# Patient Record
Sex: Male | Born: 1935 | Race: White | Hispanic: No | Marital: Married | Smoking: Former smoker
Health system: Southern US, Community
[De-identification: ages and names within clinical notes are randomized; demographics above are authoritative.]

## PROBLEM LIST (undated history)

## (undated) DIAGNOSIS — J449 Chronic obstructive pulmonary disease, unspecified: Secondary | ICD-10-CM

## (undated) DIAGNOSIS — N183 Chronic kidney disease, stage 3 (moderate): Secondary | ICD-10-CM

## (undated) DIAGNOSIS — K219 Gastro-esophageal reflux disease without esophagitis: Secondary | ICD-10-CM

## (undated) DIAGNOSIS — Z87442 Personal history of urinary calculi: Secondary | ICD-10-CM

## (undated) DIAGNOSIS — I4891 Unspecified atrial fibrillation: Secondary | ICD-10-CM

## (undated) DIAGNOSIS — Z8719 Personal history of other diseases of the digestive system: Secondary | ICD-10-CM

## (undated) DIAGNOSIS — M1990 Unspecified osteoarthritis, unspecified site: Secondary | ICD-10-CM

## (undated) DIAGNOSIS — C801 Malignant (primary) neoplasm, unspecified: Secondary | ICD-10-CM

## (undated) DIAGNOSIS — J189 Pneumonia, unspecified organism: Secondary | ICD-10-CM

## (undated) DIAGNOSIS — R600 Localized edema: Secondary | ICD-10-CM

## (undated) DIAGNOSIS — R0602 Shortness of breath: Secondary | ICD-10-CM

## (undated) DIAGNOSIS — G4730 Sleep apnea, unspecified: Secondary | ICD-10-CM

## (undated) DIAGNOSIS — I2699 Other pulmonary embolism without acute cor pulmonale: Secondary | ICD-10-CM

## (undated) DIAGNOSIS — J45909 Unspecified asthma, uncomplicated: Secondary | ICD-10-CM

## (undated) HISTORY — PX: TONSILLECTOMY: SUR1361

## (undated) HISTORY — PX: MULTIPLE TOOTH EXTRACTIONS: SHX2053

## (undated) HISTORY — PX: OTHER SURGICAL HISTORY: SHX169

---

## 2003-03-21 ENCOUNTER — Ambulatory Visit (HOSPITAL_BASED_OUTPATIENT_CLINIC_OR_DEPARTMENT_OTHER): Admission: RE | Admit: 2003-03-21 | Discharge: 2003-03-21 | Payer: Self-pay | Admitting: *Deleted

## 2011-01-29 DIAGNOSIS — E559 Vitamin D deficiency, unspecified: Secondary | ICD-10-CM | POA: Insufficient documentation

## 2012-07-06 DIAGNOSIS — N183 Chronic kidney disease, stage 3 unspecified: Secondary | ICD-10-CM

## 2012-07-06 HISTORY — DX: Chronic kidney disease, stage 3 unspecified: N18.30

## 2012-10-19 ENCOUNTER — Encounter (HOSPITAL_COMMUNITY): Payer: Self-pay | Admitting: Pharmacy Technician

## 2012-10-26 DIAGNOSIS — K219 Gastro-esophageal reflux disease without esophagitis: Secondary | ICD-10-CM | POA: Insufficient documentation

## 2012-10-27 ENCOUNTER — Encounter (HOSPITAL_COMMUNITY): Payer: Self-pay

## 2012-10-27 ENCOUNTER — Ambulatory Visit (HOSPITAL_COMMUNITY)
Admission: RE | Admit: 2012-10-27 | Discharge: 2012-10-27 | Disposition: A | Discharge status: Home / Self Care | Payer: Medicare Other | Source: Ambulatory Visit | Attending: Orthopedic Surgery | Admitting: Orthopedic Surgery

## 2012-10-27 ENCOUNTER — Encounter (HOSPITAL_COMMUNITY)
Admission: RE | Admit: 2012-10-27 | Discharge: 2012-10-27 | Disposition: A | Discharge status: Home / Self Care | Payer: Medicare Other | Source: Ambulatory Visit | Attending: Orthopedic Surgery | Admitting: Orthopedic Surgery

## 2012-10-27 DIAGNOSIS — Z01810 Encounter for preprocedural cardiovascular examination: Secondary | ICD-10-CM | POA: Insufficient documentation

## 2012-10-27 DIAGNOSIS — R0602 Shortness of breath: Secondary | ICD-10-CM | POA: Insufficient documentation

## 2012-10-27 DIAGNOSIS — R0989 Other specified symptoms and signs involving the circulatory and respiratory systems: Secondary | ICD-10-CM | POA: Insufficient documentation

## 2012-10-27 DIAGNOSIS — M129 Arthropathy, unspecified: Secondary | ICD-10-CM | POA: Insufficient documentation

## 2012-10-27 DIAGNOSIS — J449 Chronic obstructive pulmonary disease, unspecified: Secondary | ICD-10-CM | POA: Insufficient documentation

## 2012-10-27 DIAGNOSIS — Z01818 Encounter for other preprocedural examination: Secondary | ICD-10-CM | POA: Insufficient documentation

## 2012-10-27 DIAGNOSIS — J90 Pleural effusion, not elsewhere classified: Secondary | ICD-10-CM | POA: Insufficient documentation

## 2012-10-27 DIAGNOSIS — J4489 Other specified chronic obstructive pulmonary disease: Secondary | ICD-10-CM | POA: Insufficient documentation

## 2012-10-27 DIAGNOSIS — I10 Essential (primary) hypertension: Secondary | ICD-10-CM | POA: Insufficient documentation

## 2012-10-27 DIAGNOSIS — I517 Cardiomegaly: Secondary | ICD-10-CM | POA: Insufficient documentation

## 2012-10-27 DIAGNOSIS — J45909 Unspecified asthma, uncomplicated: Secondary | ICD-10-CM | POA: Insufficient documentation

## 2012-10-27 DIAGNOSIS — Z01812 Encounter for preprocedural laboratory examination: Secondary | ICD-10-CM | POA: Insufficient documentation

## 2012-10-27 DIAGNOSIS — R9431 Abnormal electrocardiogram [ECG] [EKG]: Secondary | ICD-10-CM | POA: Insufficient documentation

## 2012-10-27 HISTORY — DX: Personal history of urinary calculi: Z87.442

## 2012-10-27 HISTORY — DX: Unspecified asthma, uncomplicated: J45.909

## 2012-10-27 HISTORY — DX: Personal history of other diseases of the digestive system: Z87.19

## 2012-10-27 HISTORY — DX: Sleep apnea, unspecified: G47.30

## 2012-10-27 HISTORY — DX: Malignant (primary) neoplasm, unspecified: C80.1

## 2012-10-27 HISTORY — DX: Localized edema: R60.0

## 2012-10-27 HISTORY — DX: Pneumonia, unspecified organism: J18.9

## 2012-10-27 HISTORY — DX: Shortness of breath: R06.02

## 2012-10-27 HISTORY — DX: Other pulmonary embolism without acute cor pulmonale: I26.99

## 2012-10-27 HISTORY — DX: Gastro-esophageal reflux disease without esophagitis: K21.9

## 2012-10-27 HISTORY — DX: Unspecified osteoarthritis, unspecified site: M19.90

## 2012-10-27 HISTORY — DX: Chronic obstructive pulmonary disease, unspecified: J44.9

## 2012-10-27 LAB — BASIC METABOLIC PANEL
BUN: 15 mg/dL (ref 6–23)
Calcium: 9.1 mg/dL (ref 8.4–10.5)
Chloride: 102 mEq/L (ref 96–112)
Creatinine, Ser: 1.32 mg/dL (ref 0.50–1.35)
GFR calc Af Amer: 60 mL/min — ABNORMAL LOW (ref >90)

## 2012-10-27 LAB — CBC
HCT: 44.9 % (ref 39.0–52.0)
MCHC: 32.7 g/dL (ref 30.0–36.0)
MCV: 85.5 fL (ref 78.0–100.0)
Platelets: 145 10*3/uL — ABNORMAL LOW (ref 150–400)
RDW: 14.8 % (ref 11.5–15.5)

## 2012-10-27 LAB — PROTIME-INR: INR: 1.84 — ABNORMAL HIGH (ref 0.00–1.49)

## 2012-10-27 LAB — URINALYSIS, ROUTINE W REFLEX MICROSCOPIC
Bilirubin Urine: NEGATIVE
Glucose, UA: NEGATIVE mg/dL
Ketones, ur: NEGATIVE mg/dL
pH: 5 (ref 5.0–8.0)

## 2012-10-27 NOTE — Patient Instructions (Addendum)
YOUR SURGERY IS SCHEDULED AT [facility]  ON:   Tuesday  5/6  REPORT TO [facility] SHORT STAY CENTER AT:  9:00 AM      PHONE # FOR SHORT STAY IS [phone]  DO NOT EAT OR DRINK ANYTHING AFTER MIDNIGHT THE NIGHT BEFORE YOUR SURGERY.  YOU MAY BRUSH YOUR TEETH, RINSE OUT YOUR MOUTH--BUT NO WATER, NO FOOD, NO CHEWING GUM, NO MINTS, NO CANDIES, NO CHEWING TOBACCO.  PLEASE TAKE THE FOLLOWING MEDICATIONS THE AM OF YOUR SURGERY WITH A FEW SIPS OF WATER:   AMLODIPINE, ENALAPRIL, RANITADINE  USE ALL OF YOUR NEBULIZER MEDICATIONS.    IF YOU HAVE SLEEP APNEA AND USE CPAP OR BIPAP--PLEASE BRING THE MASK AND THE TUBING.  DO NOT BRING YOUR MACHINE.  DO NOT BRING VALUABLES, MONEY, CREDIT CARDS.  DO NOT WEAR JEWELRY, MAKE-UP, NAIL POLISH AND NO METAL PINS OR CLIPS IN YOUR HAIR. CONTACT LENS, DENTURES / PARTIALS, GLASSES SHOULD NOT BE WORN TO SURGERY AND IN MOST CASES-HEARING AIDS WILL NEED TO BE REMOVED.  BRING YOUR GLASSES CASE, ANY EQUIPMENT NEEDED FOR YOUR CONTACT LENS. FOR PATIENTS ADMITTED TO THE HOSPITAL--CHECK OUT TIME THE DAY OF DISCHARGE IS 11:00 AM.  ALL INPATIENT ROOMS ARE PRIVATE - WITH BATHROOM, TELEPHONE, TELEVISION AND WIFI INTERNET.                             PLEASE READ OVER ANY  FACT SHEETS THAT YOU WERE GIVEN: MRSA INFORMATION, BLOOD TRANSFUSION INFORMATION, INCENTIVE SPIROMETER INFORMATION. FAILURE TO FOLLOW THESE INSTRUCTIONS MAY RESULT IN THE CANCELLATION OF YOUR SURGERY.   PATIENT SIGNATURE_________________________________

## 2012-10-27 NOTE — Pre-Procedure Instructions (Signed)
EKG AND CXR WERE DONE TODAY AT [facility]. PT'S WRITTEN SIGNATURE / REQUEST FAXED TO HIS PULMONOLOGIST'S OFFICE - DR. Talmadge Coventry IN [place]--TO OBTAIN PT'S MOST RECENT OFFICE NOTE AND ANY CXR OR PULMONARY FUNCTION STUDIES. PT'S WIFE STATES PT'S SURGERY DATE MOVED FROM 4/29 TO 5/6 BECAUSE PT'S MEDICAL DOCTOR WANTS CLEARANCE FROM PULMONOGIST FOR PT TO HAVE TOTAL KNEE REPLACEMENT AND IMPUT ON TYPE OF ANESTHESIA.  PT AWAITING CLEARANCE.

## 2012-10-31 ENCOUNTER — Encounter (HOSPITAL_COMMUNITY): Payer: Self-pay

## 2012-10-31 NOTE — Pre-Procedure Instructions (Addendum)
PT'S PREOP CXR REPORT, PT, PTT REPORTS FAXED TO DR. Nilsa Nutting OFFICE FOR REVIEW - ORDER IS IN EPIC TO REPEAT PT, INR DAY OF SURGERY - AS PER ANESTHESIOLOGIST'S GUIDELINES. PT'S MOST RECENT OFFICE NOTE 09/06/12, CPAP TITRATION REPORT 11/18/06, SLEEP STUDY REPORT 10/07/06 ON THIS CHART - FAXED BY [facility]-PT'S PULMONOLOGIST.

## 2012-11-01 NOTE — Pre-Procedure Instructions (Signed)
FAXED NOTE RECEIVED FROM DR. Nilsa Nutting OFFICE - NO ACTION NEEDED REGARDING ABNORMAL LABS.

## 2012-11-04 NOTE — Pre-Procedure Instructions (Signed)
Gary Boyer - DR. Nilsa Nutting SURGERY SCHEDULER STATES - PT TO SEE DR. Dorthula Matas ON Monday 11/07/12 - Gary Boyer WILL FAX PT'S CLEARANCE FOR SURGERY - OR IF PT NOT CLEARED - SHE WILL CANCEL THE SURGERY

## 2012-11-06 NOTE — H&P (Signed)
Patient is admitted for left total knee arthroplasty.  Subjective:  Chief Complaint: Left knee OA / pain  HPI: Gary Boyer, 77 y.o. male, has a history of pain and functional disability in the left knee due to arthritis and patient has failed non-surgical conservative treatments for greater than 12 weeks to include NSAID's and/or analgesics and activity modification. Onset of symptoms was gradual starting 5 years ago with gradually worsening course since that time.The patient noted no prior procedures of the left knee. Patient currently rates pain in the left hip at 7 out of 10 with activity. Patient has worsening of pain at night, with activity and weight bearing, trendelenberg gait, pain that interfers with activities of daily living, pain with passive range of motion and crepitus. Patient has evidence of periarticular osteophytes and joint space narrowing by imaging studies. This condition presents safety issues increasing the risk of falls. There is no current active infection. Risks, benefits and expectations were discussed with the patient. Patient understand the risks, benefits and expectations and wishes to proceed with surgery.  D/C Plans: Home with HHPT / SNF  Post-op Meds: No Rx given  Tranexamic Acid: Not to be given - previous PEs  Decadron: To be given  FYI: Coumadin with branching Lovenox after surgery    Past Medical History Diagnosis Date . Dysrhythmia PAST HX OF HEART ABLATION FOR IRREGULAR HEART RATE --SUCCESSFUL-NO FURTHER PROB WITH IRREGULAR HEART BEAT . COPD (chronic obstructive pulmonary disease) HX OF SMOKING FOR 60 YRS-PT SEES DR. Talmadge Coventry IN [place] . Shortness of breath WITH ANY EXERTION-USES OXYGEN AS NEEDED . Pneumonia IN THE PAST - BUT NOT IN THE LAST YR . Asthma . Pulmonary embolus 2011 OR 2012 CHRONIC WARFARIN SINCE THE BLOOD CLOT . Bilateral leg edema WEARS SUPPORT HOSE . History of kidney stones SOME SMALL STONES AT PRESENT  TIME- TAKES POTASSIUM CITRATE . H/O hiatal hernia . GERD (gastroesophageal reflux disease) . Cancer SKIN CANCERS . Arthritis ALL OVER . Sleep apnea USES BIPAP SETTING 16 / 11 - PER PULMONOGIST'S OFFICE NOTE  Past Surgical History Procedure Laterality Date . Mass removed from stomach - benign ABOUT 2008 ? Marland Kitchen Heart ablation . Multiple tooth extractions . Tonsillectomy T & A - AS A CHILD  No prescriptions prior to admission  Allergies Allergen Reactions . Morphine And Related HALLUCINATIONS  History Substance Use Topics . Smoking status: Former Smoker -- 2.00 packs/day for 60 years Types: Cigarettes . Smokeless tobacco: Never Used Comment: QUIT SMOKING 2012 . Alcohol Use: No    Review of Systems Constitutional: Negative.  HENT: Negative.  Eyes: Negative.  Respiratory: Positive for shortness of breath and wheezing.  Cardiovascular: Negative.  Gastrointestinal: Positive for heartburn. Genitourinary: Negative.  Musculoskeletal: Positive for joint pain. Skin: Negative.  Neurological: Negative.  Endo/Heme/Allergies: Positive for environmental allergies. Psychiatric/Behavioral: Negative.    Objective:  Physical Exam Constitutional: He is oriented to person, place, and time. He appears well-developed and well-nourished. HENT: Head: Normocephalic and atraumatic. Eyes: Pupils are equal, round, and reactive to light. Neck: Neck supple. Cardiovascular: Normal rate, regular rhythm and intact distal pulses.  Respiratory: Effort normal and breath sounds normal. No respiratory distress. He has no wheezes. GI: Soft. There is no tenderness. There is no guarding. Musculoskeletal: Left knee: He exhibits decreased range of motion, swelling and bony tenderness. He exhibits no deformity, no laceration and no erythema. Tenderness found. Neurological: He is alert and oriented to person, place, and time. Skin: Skin is warm and dry. Psychiatric: He has a normal  mood  and affect.    Imaging Review Plain radiographs demonstrate severe degenerative joint disease of the left knee(s). The overall alignment isneutral. The bone quality appears to be good for age and reported activity level.  Assessment/Plan:  End stage arthritis, left knee  The patient history, physical examination, clinical judgment of the provider and imaging studies are consistent with end stage degenerative joint disease of the left knee(s) and total knee arthroplasty is deemed medically necessary. The treatment options including medical management, injection therapy arthroscopy and arthroplasty were discussed at length. The risks and benefits of total knee arthroplasty were presented and reviewed. The risks due to aseptic loosening, infection, stiffness, patella tracking problems, thromboembolic complications and other imponderables were discussed. The patient acknowledged the explanation, agreed to proceed with the plan and consent was signed. Patient is being admitted for inpatient treatment for surgery, pain control, PT, OT, prophylactic antibiotics, VTE prophylaxis, progressive ambulation and ADL's and discharge planning. The patient is planning to be discharged home with home health services vs SNF.   Anastasio Auerbach Danyel Griess   PAC  11/06/2012, 10:48 PM

## 2012-11-08 ENCOUNTER — Inpatient Hospital Stay (HOSPITAL_COMMUNITY): Payer: Medicare Other | Admitting: Anesthesiology

## 2012-11-08 ENCOUNTER — Encounter (HOSPITAL_COMMUNITY): Payer: Self-pay | Admitting: *Deleted

## 2012-11-08 ENCOUNTER — Inpatient Hospital Stay (HOSPITAL_COMMUNITY)
Admission: RE | Admit: 2012-11-08 | Discharge: 2012-11-11 | DRG: 470 | Disposition: A | Discharge status: Home Health | Payer: Medicare Other | Source: Ambulatory Visit | Attending: Orthopedic Surgery | Admitting: Orthopedic Surgery

## 2012-11-08 ENCOUNTER — Encounter (HOSPITAL_COMMUNITY): Payer: Self-pay | Admitting: Anesthesiology

## 2012-11-08 ENCOUNTER — Encounter (HOSPITAL_COMMUNITY)
Admission: RE | Disposition: A | Discharge status: Home Health | Payer: Self-pay | Source: Ambulatory Visit | Attending: Orthopedic Surgery

## 2012-11-08 DIAGNOSIS — I2782 Chronic pulmonary embolism: Secondary | ICD-10-CM | POA: Diagnosis present

## 2012-11-08 DIAGNOSIS — Z7901 Long term (current) use of anticoagulants: Secondary | ICD-10-CM

## 2012-11-08 DIAGNOSIS — K219 Gastro-esophageal reflux disease without esophagitis: Secondary | ICD-10-CM | POA: Diagnosis present

## 2012-11-08 DIAGNOSIS — D500 Iron deficiency anemia secondary to blood loss (chronic): Secondary | ICD-10-CM

## 2012-11-08 DIAGNOSIS — J4489 Other specified chronic obstructive pulmonary disease: Secondary | ICD-10-CM | POA: Diagnosis present

## 2012-11-08 DIAGNOSIS — G4730 Sleep apnea, unspecified: Secondary | ICD-10-CM | POA: Diagnosis present

## 2012-11-08 DIAGNOSIS — Z87442 Personal history of urinary calculi: Secondary | ICD-10-CM

## 2012-11-08 DIAGNOSIS — Z87891 Personal history of nicotine dependence: Secondary | ICD-10-CM

## 2012-11-08 DIAGNOSIS — M1710 Unilateral primary osteoarthritis, unspecified knee: Principal | ICD-10-CM | POA: Diagnosis present

## 2012-11-08 DIAGNOSIS — D62 Acute posthemorrhagic anemia: Secondary | ICD-10-CM | POA: Diagnosis not present

## 2012-11-08 DIAGNOSIS — J449 Chronic obstructive pulmonary disease, unspecified: Secondary | ICD-10-CM | POA: Diagnosis present

## 2012-11-08 DIAGNOSIS — Z96652 Presence of left artificial knee joint: Secondary | ICD-10-CM

## 2012-11-08 DIAGNOSIS — Z96659 Presence of unspecified artificial knee joint: Secondary | ICD-10-CM

## 2012-11-08 DIAGNOSIS — Z481 Encounter for planned postprocedural wound closure: Secondary | ICD-10-CM

## 2012-11-08 HISTORY — PX: TOTAL KNEE ARTHROPLASTY: SHX125

## 2012-11-08 LAB — CREATININE, SERUM
Creatinine, Ser: 1.58 mg/dL — ABNORMAL HIGH (ref 0.50–1.35)
GFR calc Af Amer: 48 mL/min — ABNORMAL LOW (ref >90)
GFR calc non Af Amer: 41 mL/min — ABNORMAL LOW (ref >90)

## 2012-11-08 LAB — CBC
MCHC: 33.2 g/dL (ref 30.0–36.0)
Platelets: 121 10*3/uL — ABNORMAL LOW (ref 150–400)
RDW: 14.9 % (ref 11.5–15.5)
WBC: 6.2 10*3/uL (ref 4.0–10.5)

## 2012-11-08 LAB — ABO/RH: ABO/RH(D): O NEG

## 2012-11-08 LAB — TYPE AND SCREEN

## 2012-11-08 LAB — PROTIME-INR: Prothrombin Time: 16.6 seconds — ABNORMAL HIGH (ref 11.6–15.2)

## 2012-11-08 SURGERY — ARTHROPLASTY, KNEE, TOTAL
Anesthesia: Spinal | Site: Knee | Laterality: Left | Wound class: Clean

## 2012-11-08 MED ORDER — HYDROCODONE-ACETAMINOPHEN 7.5-325 MG PO TABS
1.0000 | ORAL_TABLET | ORAL | Status: DC
  Administered 2012-11-08 – 2012-11-09 (×2): 2 via ORAL
  Administered 2012-11-09: 1 via ORAL
  Administered 2012-11-09 – 2012-11-11 (×8): 2 via ORAL
  Administered 2012-11-11: 1 via ORAL
  Administered 2012-11-11: 2 via ORAL
  Filled 2012-11-08 (×9): qty 2
  Filled 2012-11-08: qty 1
  Filled 2012-11-08 (×4): qty 2

## 2012-11-08 MED ORDER — ACETAMINOPHEN 10 MG/ML IV SOLN: 1000.0000 mg | Freq: Once | INTRAVENOUS | Status: DC | PRN

## 2012-11-08 MED ORDER — LIDOCAINE HCL (CARDIAC) 20 MG/ML IV SOLN
INTRAVENOUS | Status: DC | PRN
  Administered 2012-11-08: 50 mg via INTRAVENOUS

## 2012-11-08 MED ORDER — PROMETHAZINE HCL 25 MG/ML IJ SOLN: 6.2500 mg | INTRAMUSCULAR | Status: DC | PRN

## 2012-11-08 MED ORDER — HYDROMORPHONE HCL PF 1 MG/ML IJ SOLN
0.2500 mg | INTRAMUSCULAR | Status: DC | PRN
  Administered 2012-11-08 (×2): 0.5 mg via INTRAVENOUS

## 2012-11-08 MED ORDER — SODIUM CHLORIDE 0.9 % IV SOLN
INTRAVENOUS | Status: DC
Start: 2012-11-08 — End: 2012-11-11
  Administered 2012-11-08: 15:00:00 via INTRAVENOUS
  Filled 2012-11-08 (×9): qty 1000

## 2012-11-08 MED ORDER — WARFARIN - PHARMACIST DOSING INPATIENT: Freq: Every day | Status: DC

## 2012-11-08 MED ORDER — ONDANSETRON HCL 4 MG/2ML IJ SOLN
INTRAMUSCULAR | Status: DC | PRN
  Administered 2012-11-08: 4 mg via INTRAVENOUS

## 2012-11-08 MED ORDER — FUROSEMIDE 40 MG PO TABS
40.0000 mg | ORAL_TABLET | Freq: Every day | ORAL | Status: DC
  Administered 2012-11-09 – 2012-11-11 (×3): 40 mg via ORAL
  Filled 2012-11-08 (×5): qty 1

## 2012-11-08 MED ORDER — CEFAZOLIN SODIUM-DEXTROSE 2-3 GM-% IV SOLR
2.0000 g | Freq: Four times a day (QID) | INTRAVENOUS | Status: AC
  Administered 2012-11-08 (×2): 2 g via INTRAVENOUS
  Filled 2012-11-08 (×2): qty 50

## 2012-11-08 MED ORDER — KETOROLAC TROMETHAMINE 30 MG/ML IJ SOLN
INTRAMUSCULAR | Status: DC | PRN
  Administered 2012-11-08: 30 mg

## 2012-11-08 MED ORDER — ARFORMOTEROL TARTRATE 15 MCG/2ML IN NEBU
15.0000 ug | INHALATION_SOLUTION | Freq: Two times a day (BID) | RESPIRATORY_TRACT | Status: DC
  Administered 2012-11-08 – 2012-11-11 (×5): 15 ug via RESPIRATORY_TRACT
  Filled 2012-11-08 (×8): qty 2

## 2012-11-08 MED ORDER — FENTANYL CITRATE 0.05 MG/ML IJ SOLN
INTRAMUSCULAR | Status: DC | PRN
  Administered 2012-11-08 (×2): 50 ug via INTRAVENOUS

## 2012-11-08 MED ORDER — ONDANSETRON HCL 4 MG/2ML IJ SOLN: 4.0000 mg | Freq: Four times a day (QID) | INTRAMUSCULAR | Status: DC | PRN

## 2012-11-08 MED ORDER — DIPHENHYDRAMINE HCL 12.5 MG/5ML PO ELIX: 25.0000 mg | ORAL_SOLUTION | Freq: Four times a day (QID) | ORAL | Status: DC | PRN

## 2012-11-08 MED ORDER — MIDAZOLAM HCL 5 MG/5ML IJ SOLN
INTRAMUSCULAR | Status: DC | PRN
  Administered 2012-11-08 (×2): 1 mg via INTRAVENOUS

## 2012-11-08 MED ORDER — POTASSIUM CITRATE ER 10 MEQ (1080 MG) PO TBCR
10.0000 meq | EXTENDED_RELEASE_TABLET | Freq: Two times a day (BID) | ORAL | Status: DC
  Administered 2012-11-08 – 2012-11-11 (×6): 10 meq via ORAL
  Filled 2012-11-08 (×7): qty 1

## 2012-11-08 MED ORDER — ACETAMINOPHEN 10 MG/ML IV SOLN
INTRAVENOUS | Status: DC | PRN
  Administered 2012-11-08: 1000 mg via INTRAVENOUS

## 2012-11-08 MED ORDER — BUPIVACAINE LIPOSOME 1.3 % IJ SUSP
20.0000 mL | Freq: Once | INTRAMUSCULAR | Status: DC
  Filled 2012-11-08: qty 20

## 2012-11-08 MED ORDER — SODIUM CHLORIDE 0.9 % IJ SOLN
INTRAMUSCULAR | Status: DC | PRN
  Administered 2012-11-08: 19 mL

## 2012-11-08 MED ORDER — DEXTROSE 5 % IV SOLN
3.0000 g | INTRAVENOUS | Status: AC
  Administered 2012-11-08: 3 g via INTRAVENOUS

## 2012-11-08 MED ORDER — AMLODIPINE BESYLATE 10 MG PO TABS
10.0000 mg | ORAL_TABLET | Freq: Every morning | ORAL | Status: DC
  Administered 2012-11-09 – 2012-11-11 (×3): 10 mg via ORAL
  Filled 2012-11-08 (×3): qty 1

## 2012-11-08 MED ORDER — POTASSIUM CHLORIDE CRYS ER 20 MEQ PO TBCR
20.0000 meq | EXTENDED_RELEASE_TABLET | Freq: Two times a day (BID) | ORAL | Status: DC
  Administered 2012-11-08 – 2012-11-11 (×6): 20 meq via ORAL
  Filled 2012-11-08 (×8): qty 1

## 2012-11-08 MED ORDER — PHENOL 1.4 % MT LIQD: 1.0000 | OROMUCOSAL | Status: DC | PRN

## 2012-11-08 MED ORDER — BUPIVACAINE LIPOSOME 1.3 % IJ SUSP
INTRAMUSCULAR | Status: DC | PRN
  Administered 2012-11-08: 20 mL

## 2012-11-08 MED ORDER — FAMOTIDINE 20 MG PO TABS
20.0000 mg | ORAL_TABLET | Freq: Every day | ORAL | Status: DC
  Administered 2012-11-09 – 2012-11-11 (×3): 20 mg via ORAL
  Filled 2012-11-08 (×3): qty 1

## 2012-11-08 MED ORDER — BUDESONIDE 0.5 MG/2ML IN SUSP
0.5000 mg | Freq: Two times a day (BID) | RESPIRATORY_TRACT | Status: DC
  Administered 2012-11-08 – 2012-11-11 (×5): 0.5 mg via RESPIRATORY_TRACT
  Filled 2012-11-08 (×8): qty 2

## 2012-11-08 MED ORDER — ONDANSETRON HCL 4 MG PO TABS: 4.0000 mg | ORAL_TABLET | Freq: Four times a day (QID) | ORAL | Status: DC | PRN

## 2012-11-08 MED ORDER — IPRATROPIUM BROMIDE 0.02 % IN SOLN
500.0000 ug | Freq: Four times a day (QID) | RESPIRATORY_TRACT | Status: DC
  Administered 2012-11-08 – 2012-11-11 (×10): 500 ug via RESPIRATORY_TRACT
  Filled 2012-11-08 (×12): qty 2.5

## 2012-11-08 MED ORDER — PROPOFOL 10 MG/ML IV EMUL
INTRAVENOUS | Status: DC | PRN
  Administered 2012-11-08: 50 ug/kg/min via INTRAVENOUS

## 2012-11-08 MED ORDER — 0.9 % SODIUM CHLORIDE (POUR BTL) OPTIME
TOPICAL | Status: DC | PRN
  Administered 2012-11-08: 1000 mL

## 2012-11-08 MED ORDER — MENTHOL 3 MG MT LOZG: 1.0000 | LOZENGE | OROMUCOSAL | Status: DC | PRN

## 2012-11-08 MED ORDER — ENOXAPARIN SODIUM 40 MG/0.4ML ~~LOC~~ SOLN
40.0000 mg | SUBCUTANEOUS | Status: DC
  Administered 2012-11-09 – 2012-11-11 (×3): 40 mg via SUBCUTANEOUS
  Filled 2012-11-08 (×4): qty 0.4

## 2012-11-08 MED ORDER — PHENYLEPHRINE HCL 10 MG/ML IJ SOLN
20.0000 mg | INTRAVENOUS | Status: DC | PRN
  Administered 2012-11-08: 20 ug/min via INTRAVENOUS

## 2012-11-08 MED ORDER — VITAMIN B-12 1000 MCG PO TABS
1000.0000 ug | ORAL_TABLET | Freq: Every day | ORAL | Status: DC
  Administered 2012-11-08 – 2012-11-11 (×4): 1000 ug via ORAL
  Filled 2012-11-08 (×4): qty 1

## 2012-11-08 MED ORDER — BUPIVACAINE-EPINEPHRINE PF 0.25-1:200000 % IJ SOLN
INTRAMUSCULAR | Status: DC | PRN
  Administered 2012-11-08: 20 mL

## 2012-11-08 MED ORDER — MEPERIDINE HCL 50 MG/ML IJ SOLN: 6.2500 mg | INTRAMUSCULAR | Status: DC | PRN

## 2012-11-08 MED ORDER — DEXAMETHASONE SODIUM PHOSPHATE 10 MG/ML IJ SOLN
10.0000 mg | Freq: Once | INTRAMUSCULAR | Status: AC
  Administered 2012-11-08: 10 mg via INTRAVENOUS

## 2012-11-08 MED ORDER — METHOCARBAMOL 500 MG PO TABS
500.0000 mg | ORAL_TABLET | Freq: Four times a day (QID) | ORAL | Status: DC | PRN
  Administered 2012-11-09 – 2012-11-11 (×5): 500 mg via ORAL
  Filled 2012-11-08 (×5): qty 1

## 2012-11-08 MED ORDER — ALUM & MAG HYDROXIDE-SIMETH 200-200-20 MG/5ML PO SUSP
30.0000 mL | ORAL | Status: DC | PRN
  Administered 2012-11-09: 30 mL via ORAL
  Filled 2012-11-08: qty 30

## 2012-11-08 MED ORDER — LACTATED RINGERS IV SOLN
INTRAVENOUS | Status: DC | PRN
  Administered 2012-11-08: 11:00:00 via INTRAVENOUS

## 2012-11-08 MED ORDER — ZOLPIDEM TARTRATE 5 MG PO TABS: 5.0000 mg | ORAL_TABLET | Freq: Every evening | ORAL | Status: DC | PRN

## 2012-11-08 MED ORDER — EPHEDRINE SULFATE 50 MG/ML IJ SOLN
INTRAMUSCULAR | Status: DC | PRN
  Administered 2012-11-08: 10 mg via INTRAVENOUS
  Administered 2012-11-08: 5 mg via INTRAVENOUS
  Administered 2012-11-08: 10 mg via INTRAVENOUS

## 2012-11-08 MED ORDER — CHLORHEXIDINE GLUCONATE 4 % EX LIQD: 60.0000 mL | Freq: Once | CUTANEOUS | Status: DC

## 2012-11-08 MED ORDER — SERTRALINE HCL 50 MG PO TABS
50.0000 mg | ORAL_TABLET | Freq: Every day | ORAL | Status: DC
Start: 2012-11-08 — End: 2012-11-11
  Administered 2012-11-08 – 2012-11-11 (×3): 50 mg via ORAL
  Filled 2012-11-08 (×4): qty 1

## 2012-11-08 MED ORDER — HYDROMORPHONE HCL PF 1 MG/ML IJ SOLN
0.2000 mg | INTRAMUSCULAR | Status: DC | PRN
  Administered 2012-11-09: 0.5 mg via INTRAVENOUS
  Filled 2012-11-08: qty 1

## 2012-11-08 MED ORDER — WARFARIN SODIUM 7.5 MG PO TABS
7.5000 mg | ORAL_TABLET | Freq: Once | ORAL | Status: AC
  Administered 2012-11-08: 7.5 mg via ORAL
  Filled 2012-11-08: qty 1

## 2012-11-08 MED ORDER — FERROUS SULFATE 325 (65 FE) MG PO TABS
325.0000 mg | ORAL_TABLET | Freq: Three times a day (TID) | ORAL | Status: DC
  Administered 2012-11-09 – 2012-11-11 (×6): 325 mg via ORAL
  Filled 2012-11-08 (×11): qty 1

## 2012-11-08 MED ORDER — ALBUTEROL SULFATE (5 MG/ML) 0.5% IN NEBU: 2.5000 mg | INHALATION_SOLUTION | Freq: Four times a day (QID) | RESPIRATORY_TRACT | Status: DC | PRN

## 2012-11-08 MED ORDER — METHOCARBAMOL 100 MG/ML IJ SOLN: 500.0000 mg | Freq: Four times a day (QID) | INTRAVENOUS | Status: DC | PRN

## 2012-11-08 SURGICAL SUPPLY — 54 items
BAG ZIPLOCK 12X15 (MISCELLANEOUS) ×2 IMPLANT
BANDAGE ELASTIC 6 VELCRO ST LF (GAUZE/BANDAGES/DRESSINGS) ×2 IMPLANT
BANDAGE ESMARK 6X9 LF (GAUZE/BANDAGES/DRESSINGS) ×1 IMPLANT
BLADE SAW SGTL 13.0X1.19X90.0M (BLADE) ×4 IMPLANT
BNDG ESMARK 6X9 LF (GAUZE/BANDAGES/DRESSINGS) ×2
BOWL SMART MIX CTS (DISPOSABLE) ×2 IMPLANT
CEMENT HV SMART SET (Cement) ×4 IMPLANT
CLOTH BEACON ORANGE TIMEOUT ST (SAFETY) ×2 IMPLANT
CUFF TOURN SGL QUICK 34 (TOURNIQUET CUFF) ×1
CUFF TRNQT CYL 34X4X40X1 (TOURNIQUET CUFF) ×1 IMPLANT
DECANTER SPIKE VIAL GLASS SM (MISCELLANEOUS) ×2 IMPLANT
DERMABOND ADVANCED (GAUZE/BANDAGES/DRESSINGS) ×1
DERMABOND ADVANCED .7 DNX12 (GAUZE/BANDAGES/DRESSINGS) ×1 IMPLANT
DRAPE EXTREMITY T 121X128X90 (DRAPE) ×2 IMPLANT
DRAPE POUCH INSTRU U-SHP 10X18 (DRAPES) ×2 IMPLANT
DRAPE U-SHAPE 47X51 STRL (DRAPES) ×2 IMPLANT
DRSG AQUACEL AG ADV 3.5X10 (GAUZE/BANDAGES/DRESSINGS) ×2 IMPLANT
DRSG TEGADERM 4X4.75 (GAUZE/BANDAGES/DRESSINGS) ×2 IMPLANT
DURAPREP 26ML APPLICATOR (WOUND CARE) ×2 IMPLANT
ELECT REM PT RETURN 9FT ADLT (ELECTROSURGICAL) ×2
ELECTRODE REM PT RTRN 9FT ADLT (ELECTROSURGICAL) ×1 IMPLANT
EVACUATOR 1/8 PVC DRAIN (DRAIN) ×2 IMPLANT
FACESHIELD LNG OPTICON STERILE (SAFETY) ×10 IMPLANT
GAUZE SPONGE 2X2 8PLY STRL LF (GAUZE/BANDAGES/DRESSINGS) ×1 IMPLANT
GLOVE BIOGEL PI IND STRL 7.5 (GLOVE) ×1 IMPLANT
GLOVE BIOGEL PI IND STRL 8 (GLOVE) ×1 IMPLANT
GLOVE BIOGEL PI INDICATOR 7.5 (GLOVE) ×1
GLOVE BIOGEL PI INDICATOR 8 (GLOVE) ×1
GLOVE ECLIPSE 8.0 STRL XLNG CF (GLOVE) ×2 IMPLANT
GLOVE ORTHO TXT STRL SZ7.5 (GLOVE) ×4 IMPLANT
GOWN BRE IMP PREV XXLGXLNG (GOWN DISPOSABLE) ×2 IMPLANT
GOWN STRL NON-REIN LRG LVL3 (GOWN DISPOSABLE) ×2 IMPLANT
HANDPIECE INTERPULSE COAX TIP (DISPOSABLE) ×1
KIT BASIN OR (CUSTOM PROCEDURE TRAY) ×2 IMPLANT
MANIFOLD NEPTUNE II (INSTRUMENTS) ×2 IMPLANT
NDL SAFETY ECLIPSE 18X1.5 (NEEDLE) ×1 IMPLANT
NEEDLE HYPO 18GX1.5 SHARP (NEEDLE) ×1
NS IRRIG 1000ML POUR BTL (IV SOLUTION) ×4 IMPLANT
PACK TOTAL JOINT (CUSTOM PROCEDURE TRAY) ×2 IMPLANT
POSITIONER SURGICAL ARM (MISCELLANEOUS) ×2 IMPLANT
SET HNDPC FAN SPRY TIP SCT (DISPOSABLE) ×1 IMPLANT
SET PAD KNEE POSITIONER (MISCELLANEOUS) ×2 IMPLANT
SPONGE GAUZE 2X2 STER 10/PKG (GAUZE/BANDAGES/DRESSINGS) ×1
SUCTION FRAZIER 12FR DISP (SUCTIONS) ×2 IMPLANT
SUT MNCRL AB 4-0 PS2 18 (SUTURE) ×2 IMPLANT
SUT VIC AB 1 CT1 36 (SUTURE) ×2 IMPLANT
SUT VIC AB 2-0 CT1 27 (SUTURE) ×3
SUT VIC AB 2-0 CT1 TAPERPNT 27 (SUTURE) ×3 IMPLANT
SUT VLOC 180 0 24IN GS25 (SUTURE) ×2 IMPLANT
SYR 50ML LL SCALE MARK (SYRINGE) ×2 IMPLANT
TOWEL OR 17X26 10 PK STRL BLUE (TOWEL DISPOSABLE) ×4 IMPLANT
TRAY FOLEY CATH 14FRSI W/METER (CATHETERS) ×2 IMPLANT
WATER STERILE IRR 1500ML POUR (IV SOLUTION) ×2 IMPLANT
WRAP KNEE MAXI GEL POST OP (GAUZE/BANDAGES/DRESSINGS) ×2 IMPLANT

## 2012-11-08 NOTE — Interval H&P Note (Signed)
History and Physical Interval Note:  11/08/2012 10:08 AM  Nestor Lewandowsky  has presented today for surgery, with the diagnosis of Osteoarthritis of the Left Knee  The various methods of treatment have been discussed with the patient and family. After consideration of risks, benefits and other options for treatment, the patient has consented to  Procedure(s): LEFT TOTAL KNEE ARTHROPLASTY (Left) as a surgical intervention .  The patient's history has been reviewed, patient examined, no change in status, stable for surgery.  I have reviewed the patient's chart and labs.  Questions were answered to the patient's satisfaction.     Shelda Pal

## 2012-11-08 NOTE — Evaluation (Signed)
Physical Therapy Evaluation Patient Details Name: Gary Boyer MRN: [id number] DOB: [birth year] Today's Date: 11/08/2012 Time: 4098-1191 PT Time Calculation (min): 21 min  PT Assessment / Plan / Recommendation Clinical Impression  77 yo male s/p L TKA. On eval POD 0, pt required Min assist for mobility-able to ambulate ~50 feet with RW. Pt/family undecided about d/c plans at this time. Open to SNF placement but would prefer pt d/c home.     PT Assessment  Patient needs continued PT services    Follow Up Recommendations  Home health PT;SNF (depending on progress with PT)    Does the patient have the potential to tolerate intense rehabilitation      Barriers to Discharge        Equipment Recommendations  Rolling walker with 5" wheels    Recommendations for Other Services OT consult   Frequency 7X/week    Precautions / Restrictions Precautions Precautions: Fall;Knee Restrictions Weight Bearing Restrictions: No LLE Weight Bearing: Weight bearing as tolerated   Pertinent Vitals/Pain 2/10 L knee      Mobility  Bed Mobility Bed Mobility: Supine to Sit Supine to Sit: 4: Min assist Details for Bed Mobility Assistance: Assist for L LE off bed.  Transfers Transfers: Sit to Stand;Stand to Sit Sit to Stand: 4: Min assist;From bed;From elevated surface Stand to Sit: 4: Min assist;To chair/3-in-1;With armrests;With upper extremity assist Details for Transfer Assistance: VCs safety, technique, hand placement. Assist to rise, stabilze, control descent Ambulation/Gait Ambulation/Gait Assistance: 4: Min assist Ambulation Distance (Feet): 50 Feet Assistive device: Rolling walker Ambulation/Gait Assistance Details: VCS safety, sequence, distance from RW. Repeated cueing for pt to stay inside frame of walker, however pt preferred to continue with his improper technique.  Gait Pattern: Step-to pattern;Step-through pattern;Decreased stride length    Exercises     PT Diagnosis:  Difficulty walking;Abnormality of gait;Acute pain  PT Problem List: Decreased strength;Decreased range of motion;Decreased mobility;Pain;Decreased knowledge of use of DME;Decreased knowledge of precautions;Obesity PT Treatment Interventions: DME instruction;Gait training;Stair training;Functional mobility training;Therapeutic activities;Therapeutic exercise;Patient/family education   PT Goals Acute Rehab PT Goals PT Goal Formulation: With patient/family Time For Goal Achievement: 11/15/12 Potential to Achieve Goals: Good Pt will go Supine/Side to Sit: with supervision PT Goal: Supine/Side to Sit - Progress: Goal set today Pt will go Sit to Supine/Side: with supervision PT Goal: Sit to Supine/Side - Progress: Goal set today Pt will go Sit to Stand: with supervision PT Goal: Sit to Stand - Progress: Goal set today Pt will Ambulate: >150 feet;with supervision;with least restrictive assistive device PT Goal: Ambulate - Progress: Goal set today Pt will Go Up / Down Stairs: Flight;with rail(s);with least restrictive assistive device PT Goal: Up/Down Stairs - Progress: Goal set today Pt will Perform Home Exercise Program: with supervision, verbal cues required/provided PT Goal: Perform Home Exercise Program - Progress: Goal set today  Visit Information  Last PT Received On: 11/08/12 Assistance Needed: +1    Subjective Data  Subjective: That didn't hurt much more than it did before I had that done Patient Stated Goal: Regain independence. Home   Prior Functioning  Home Living Lives With: Spouse Available Help at Discharge: Family Type of Home: House Home Access: Stairs to enter Secretary/administrator of Steps: 3 Entrance Stairs-Rails: None Home Layout: Two level Alternate Level Stairs-Number of Steps: 18 Home Adaptive Equipment: None Prior Function Level of Independence: Independent Able to Take Stairs?: Yes Driving: Yes Communication Communication: No difficulties     Cognition  Cognition Arousal/Alertness: Awake/alert Behavior During Therapy:  WFL for tasks assessed/performed Overall Cognitive Status: Within Functional Limits for tasks assessed    Extremity/Trunk Assessment Right Lower Extremity Assessment RLE ROM/Strength/Tone: [facility] for tasks assessed Left Lower Extremity Assessment LLE ROM/Strength/Tone: Deficits LLE ROM/Strength/Tone Deficits: hip flex 3/5, moves ankle well  Trunk Assessment Trunk Assessment: Normal   Balance    End of Session PT - End of Session Activity Tolerance: Patient tolerated treatment well Patient left: in chair;with call bell/phone within reach;with family/visitor present  GP     Rebeca Alert, MPT Pager: [phone]

## 2012-11-08 NOTE — Anesthesia Postprocedure Evaluation (Signed)
Anesthesia Post Note  Patient: Gary Boyer  Procedure(s) Performed: Procedure(s) (LRB): LEFT TOTAL KNEE ARTHROPLASTY (Left)  Anesthesia type: Spinal  Patient location: PACU  Post pain: Pain level controlled  Post assessment: Post-op Vital signs reviewed  Last Vitals: BP 115/68  Pulse 73  Temp(Src) 36.7 C (Oral)  Resp 13  SpO2 94%  Post vital signs: Reviewed  Level of consciousness: sedated  Complications: No apparent anesthesia complications

## 2012-11-08 NOTE — Anesthesia Preprocedure Evaluation (Addendum)
Anesthesia Evaluation  Patient identified by MRN, date of birth, ID band Patient awake    Reviewed: Allergy & Precautions, H&P , NPO status , Patient's Chart, lab work & pertinent test results  Airway Mallampati: II TM Distance: >3 FB Neck ROM: Full    Dental  (+) Dental Advisory Given, Edentulous Upper and Edentulous Lower   Pulmonary shortness of breath and with exertion, asthma , sleep apnea and Continuous Positive Airway Pressure Ventilation , pneumonia -, resolved, COPD COPD inhaler, former smoker,    + decreased breath sounds      Cardiovascular hypertension, Pt. on medications + dysrhythmias Rhythm:Regular Rate:Normal     Neuro/Psych negative neurological ROS  negative psych ROS   GI/Hepatic Neg liver ROS, hiatal hernia, GERD-  Medicated,  Endo/Other  Morbid obesity  Renal/GU negative Renal ROS     Musculoskeletal  (+) Arthritis -, Osteoarthritis,    Abdominal   Peds  Hematology negative hematology ROS (+)   Anesthesia Other Findings   Reproductive/Obstetrics                          Anesthesia Physical Anesthesia Plan  ASA: IV  Anesthesia Plan: Spinal   Post-op Pain Management:    Induction: Intravenous  Airway Management Planned: Simple Face Mask  Additional Equipment:   Intra-op Plan:   Post-operative Plan:   Informed Consent: I have reviewed the patients History and Physical, chart, labs and discussed the procedure including the risks, benefits and alternatives for the proposed anesthesia with the patient or authorized representative who has indicated his/her understanding and acceptance.   Dental advisory given  Plan Discussed with: CRNA  Anesthesia Plan Comments:         Anesthesia Quick Evaluation

## 2012-11-08 NOTE — Op Note (Signed)
NAME:  Gary Boyer                      MEDICAL RECORD NO.:  [id number]                             FACILITY:  [facility]      PHYSICIAN:  Madlyn Frankel. Charlann Boxer, M.D.  DATE OF BIRTH:  March 19, 1936      DATE OF PROCEDURE:  11/08/2012                                     OPERATIVE REPORT         PREOPERATIVE DIAGNOSIS:  Left knee osteoarthritis.      POSTOPERATIVE DIAGNOSIS:  Left knee osteoarthritis.      FINDINGS:  The patient was noted to have complete loss of cartilage and   bone-on-bone arthritis with associated osteophytes in the medial and patellofemoral compartments of   the knee with a significant synovitis and associated effusion.      PROCEDURE:  Left total knee replacement.      COMPONENTS USED:  DePuy rotating platform posterior stabilized knee   system, a size 5 femur, 5 tibia, 15 mm insert, and 41 patellar   button.      SURGEON:  Madlyn Frankel. Charlann Boxer, M.D.      ASSISTANT:  Leilani Able, PA-C.      ANESTHESIA:  Spinal.      SPECIMENS:  None.      COMPLICATION:  None.      DRAINS:  One Hemovac.  EBL: <200cc      TOURNIQUET TIME:   Total Tourniquet Time Documented: Thigh (Left) - 39 minutes Total: Thigh (Left) - 39 minutes  .      The patient was stable to the recovery room.      INDICATION FOR PROCEDURE:  Gary Boyer is a 77 y.o. male patient of   mine.  The patient had been seen, evaluated, and treated conservatively in the   office with medication, activity modification, and injections.  The patient had   radiographic changes of bone-on-bone arthritis with endplate sclerosis and osteophytes noted.      The patient failed conservative measures including medication, injections, and activity modification, and at this point was ready for more definitive measures.   Based on the radiographic changes and failed conservative measures, the patient   decided to proceed with total knee replacement.  Risks of infection,   DVT, component failure, need for revision surgery,  postop course, and   expectations were all   discussed and reviewed.  Consent was obtained for benefit of pain   relief.      PROCEDURE IN DETAIL:  The patient was brought to the operative theater.   Once adequate anesthesia, preoperative antibiotics, 2 gm of Ancef administered, the patient was positioned supine with the left thigh tourniquet placed.  The  left lower extremity was prepped and draped in sterile fashion.  A time-   out was performed identifying the patient, planned procedure, and   extremity.      The left lower extremity was placed in the [facility] leg holder.  The leg was   exsanguinated, tourniquet elevated to 250 mmHg.  A midline incision was   made followed by median parapatellar arthrotomy.  Following initial   exposure, attention was first directed to  the patella.  Precut   measurement was noted to be 26 mm.  I resected down to 14-15 mm and used a   41 patellar button to restore patellar height as well as cover the cut   surface.      The lug holes were drilled and a metal shim was placed to protect the   patella from retractors and saw blades.      At this point, attention was now directed to the femur.  The femoral   canal was opened with a drill, irrigated to try to prevent fat emboli.  An   intramedullary rod was passed at 5 degrees valgus, 10 mm of bone was   resected off the distal femur.  Following this resection, the tibia was   subluxated anteriorly.  Using the extramedullary guide, 10 mm of bone was resected off   the proximal lateral tibia.  We confirmed the gap would be   stable medially and laterally with a 10 mm insert as well as confirmed   the cut was perpendicular in the coronal plane, checking with an alignment rod.      Once this was done, I sized the femur to be a size 5 in the anterior-   posterior dimension, chose a standard component based on medial and   lateral dimension.  The size 5 rotation block was then pinned in   position anterior  referenced using the C-clamp to set rotation.  The   anterior, posterior, and  chamfer cuts were made without difficulty nor   notching making certain that I was along the anterior cortex to help   with flexion gap stability.      The final box cut was made off the lateral aspect of distal femur.      At this point, the tibia was sized to be a size 5, the size 5 tray was   then pinned in position through the medial third of the tubercle,   drilled, and keel punched.  Trial reduction was now carried with a 5 femur,  5 tibia, ultimately a 15 mm insert, and the 41 patella botton.  The knee was brought to   extension, full extension with good flexion stability with the patella   tracking through the trochlea without application of pressure.  Given   all these findings, the trial components removed.  Final components were   opened and cement was mixed.  The knee was irrigated with normal saline   solution and pulse lavage.  The synovial lining was   then injected with 0.25% Marcaine with epinephrine and 1 cc of Toradol,   total of 61 cc.      The knee was irrigated.  Final implants were then cemented onto clean and   dried cut surfaces of bone with the knee brought to extension with a 15 mm trial insert.      Once the cement had fully cured, the excess cement was removed   throughout the knee.  I confirmed I was satisfied with the range of   motion and stability, and the final 15 mm PS insert was chosen.  It was   placed into the knee.      The tourniquet had been let down at 39 minutes.  No significant   hemostasis required.  The medium Hemovac drain was placed deep.  The   extensor mechanism was then reapproximated using #1 Vicryl with the knee   in flexion.  The   remaining  wound was closed with 2-0 Vicryl and running 4-0 Monocryl.   The knee was cleaned, dried, dressed sterilely using Dermabond and   Aquacel dressing.  Drain site dressed separately.  The patient was then   brought to  recovery room in stable condition, tolerating the procedure   well.   Please note that Physician Assistant, Leilani Able, was present for the entirety of the case, and was utilized for pre-operative positioning, peri-operative retractor management, general facilitation of the procedure.  He was also utilized for primary wound closure at the end of the case.              Madlyn Frankel Charlann Boxer, M.D.

## 2012-11-08 NOTE — Transfer of Care (Signed)
Immediate Anesthesia Transfer of Care Note  Patient: Gary Boyer  Procedure(s) Performed: Procedure(s): LEFT TOTAL KNEE ARTHROPLASTY (Left)  Patient Location: PACU  Anesthesia Type:Spinal  Level of Consciousness: awake, alert , oriented and patient cooperative  Airway & Oxygen Therapy: Patient Spontanous Breathing and Patient connected to face mask oxygen  Post-op Assessment: Report given to PACU RN and Post -op Vital signs reviewed and stable  Post vital signs: Reviewed and stable  Complications: No apparent anesthesia complications

## 2012-11-08 NOTE — Progress Notes (Signed)
ANTICOAGULATION CONSULT NOTE - Initial Consult  Pharmacy Consult for Warfarin Indication: H/O Pulmonary Embolism  Allergies  Allergen Reactions  . Morphine And Related     HALLUCINATIONS    Patient Measurements: Height: 6' 0.05" (183 cm) Weight: 307 lb 1.6 oz (139.3 kg) IBW/kg (Calculated) : 77.71  Vital Signs: Temp: 98 F (36.7 C) (05/06 1430) Temp src: Oral (05/06 0852) BP: 115/68 mmHg (05/06 1430) Pulse Rate: 99 (05/06 1430)  Labs:  Recent Labs  11/08/12 0915  LABPROT 16.6*  INR 1.38    Estimated Creatinine Clearance: 71 ml/min (by C-G formula based on Cr of 1.32).   Medical History: Past Medical History  Diagnosis Date  . Dysrhythmia     PAST HX OF HEART ABLATION FOR IRREGULAR HEART RATE --SUCCESSFUL-NO FURTHER PROB WITH IRREGULAR HEART BEAT  . COPD (chronic obstructive pulmonary disease)     HX OF SMOKING FOR 60 YRS-PT SEES DR. [employer] IN [place]   . Shortness of breath     WITH ANY EXERTION-USES OXYGEN AS NEEDED  . Pneumonia     IN THE PAST - BUT NOT IN THE LAST YR  . Asthma   . Pulmonary embolus 2011 OR 2012    CHRONIC WARFARIN SINCE THE BLOOD CLOT  . Bilateral leg edema     WEARS SUPPORT HOSE  . History of kidney stones     SOME SMALL STONES AT PRESENT TIME- TAKES POTASSIUM CITRATE  . H/O hiatal hernia   . GERD (gastroesophageal reflux disease)   . Cancer     SKIN CANCERS  . Arthritis     ALL OVER  . Sleep apnea     USES BIPAP  SETTING 16 / 11 - PER PULMONOGIST'S OFFICE NOTE    Medications:  Scheduled:  . [START ON 11/09/2012] amLODipine  10 mg Oral q morning - 10a  . arformoterol  15 mcg Nebulization BID  . budesonide  0.5 mg Nebulization BID  . [COMPLETED]  ceFAZolin (ANCEF) IV  3 g Intravenous On Call to OR  .  ceFAZolin (ANCEF) IV  2 g Intravenous Q6H  . [COMPLETED] dexamethasone  10 mg Intravenous Once  . [START ON 11/09/2012] enoxaparin (LOVENOX) injection  40 mg Subcutaneous Q24H  . [START ON 11/09/2012] famotidine  20 mg Oral Daily   . ferrous sulfate  325 mg Oral TID PC  . furosemide  40 mg Oral Daily  . HYDROcodone-acetaminophen  1-2 tablet Oral Q4H  . ipratropium  500 mcg Nebulization QID  . potassium chloride  20 mEq Oral BID  . potassium citrate  10 mEq Oral BID  . sertraline  50 mg Oral QHS  . vitamin B-12  1,000 mcg Oral Daily  . [DISCONTINUED] bupivacaine liposome  20 mL Infiltration Once  . [DISCONTINUED] chlorhexidine  60 mL Topical Once   Infusions:  . sodium chloride 0.9 % 1,000 mL infusion      Assessment:  77 year old male s/p left TKA today  On warfarin 4.5mg  daily PTA for h/o pulmonary embolism.  Last dose taken 5/1.  INR today = 1.38  Warfarin to resume tonight along with bridging per MD with Lovenox 40mg  daily until INR > 1.8  Warfarin score = 6 (will give tonight's dose approx 1.5 x home regimen)  Goal of Therapy:  INR 2-3   Plan:   Warfarin 7.5 mg po x 1 tonight  Check daily PT/INR  Maryellen Pile, PharmD 11/08/2012,3:08 PM

## 2012-11-08 NOTE — Anesthesia Procedure Notes (Signed)
Spinal  Patient location during procedure: OR Start time: 11/08/2012 11:07 AM End time: 11/08/2012 11:20 AM Staffing Anesthesiologist: Lewie Loron R Performed by: anesthesiologist  Preanesthetic Checklist Completed: patient identified, site marked, surgical consent, pre-op evaluation, timeout performed, IV checked, risks and benefits discussed and monitors and equipment checked Spinal Block Patient position: sitting Prep: Betadine Patient monitoring: heart rate, continuous pulse ox and blood pressure Approach: right paramedian Location: L3-4 Injection technique: single-shot Needle Needle type: Quincke  Needle gauge: 22 G Needle length: 9 cm Assessment Sensory level: T8 Additional Notes Expiration date of kit checked and confirmed. Patient tolerated procedure well, without complications.

## 2012-11-09 ENCOUNTER — Encounter (HOSPITAL_COMMUNITY): Payer: Self-pay | Admitting: Orthopedic Surgery

## 2012-11-09 DIAGNOSIS — D500 Iron deficiency anemia secondary to blood loss (chronic): Secondary | ICD-10-CM

## 2012-11-09 LAB — BASIC METABOLIC PANEL
Calcium: 8.2 mg/dL — ABNORMAL LOW (ref 8.4–10.5)
GFR calc Af Amer: 48 mL/min — ABNORMAL LOW (ref >90)
GFR calc non Af Amer: 42 mL/min — ABNORMAL LOW (ref >90)
Glucose, Bld: 116 mg/dL — ABNORMAL HIGH (ref 70–99)
Potassium: 4.6 mEq/L (ref 3.5–5.1)
Sodium: 137 mEq/L (ref 135–145)

## 2012-11-09 LAB — CBC
Hemoglobin: 11.3 g/dL — ABNORMAL LOW (ref 13.0–17.0)
MCH: 27.2 pg (ref 26.0–34.0)
MCHC: 31.7 g/dL (ref 30.0–36.0)
Platelets: 115 10*3/uL — ABNORMAL LOW (ref 150–400)
RDW: 14.9 % (ref 11.5–15.5)

## 2012-11-09 LAB — PROTIME-INR: INR: 1.44 (ref 0.00–1.49)

## 2012-11-09 MED ORDER — HYDROCODONE-ACETAMINOPHEN 5-325 MG PO TABS: 1.0000 | ORAL_TABLET | ORAL | Status: DC | PRN

## 2012-11-09 MED ORDER — TRAMADOL HCL 50 MG PO TABS: 50.0000 mg | ORAL_TABLET | Freq: Four times a day (QID) | ORAL | Status: DC | PRN

## 2012-11-09 MED ORDER — FERROUS SULFATE 325 (65 FE) MG PO TABS: 325.0000 mg | ORAL_TABLET | Freq: Three times a day (TID) | ORAL | Status: DC

## 2012-11-09 MED ORDER — ENOXAPARIN SODIUM 40 MG/0.4ML ~~LOC~~ SOLN: 40.0000 mg | SUBCUTANEOUS | Status: DC

## 2012-11-09 MED ORDER — POLYETHYLENE GLYCOL 3350 17 G PO PACK
17.0000 g | PACK | Freq: Every day | ORAL | Status: DC
  Administered 2012-11-09 – 2012-11-11 (×2): 17 g via ORAL

## 2012-11-09 MED ORDER — WARFARIN SODIUM 5 MG PO TABS: 7.5000 mg | ORAL_TABLET | Freq: Every day | ORAL | Status: DC

## 2012-11-09 MED ORDER — WARFARIN SODIUM 4 MG PO TABS
4.5000 mg | ORAL_TABLET | Freq: Once | ORAL | Status: AC
  Administered 2012-11-09: 4.5 mg via ORAL
  Filled 2012-11-09: qty 1

## 2012-11-09 MED ORDER — METHOCARBAMOL 500 MG PO TABS: 500.0000 mg | ORAL_TABLET | Freq: Four times a day (QID) | ORAL | Status: DC | PRN

## 2012-11-09 NOTE — Progress Notes (Signed)
ANTICOAGULATION CONSULT NOTE - Follow Up Consult  Pharmacy Consult for Warfarin Indication: Hx PE, postop VTE prophylaxis  Allergies  Allergen Reactions  . Morphine And Related     HALLUCINATIONS    Patient Measurements: Height: 6' 0.05" (183 cm) Weight: 307 lb 1.6 oz (139.3 kg) IBW/kg (Calculated) : 77.71  Vital Signs: Temp: 98.4 F (36.9 C) (05/07 1008) BP: 132/61 mmHg (05/07 1008) Pulse Rate: 69 (05/07 1008)  Labs:  Recent Labs  11/08/12 0915 11/08/12 1456 11/09/12 0507  HGB  --  13.2 11.3*  HCT  --  39.8 35.7*  PLT  --  121* 115*  LABPROT 16.6*  --  17.2*  INR 1.38  --  1.44  CREATININE  --  1.58* 1.57*    Estimated Creatinine Clearance: 59.7 ml/min (by C-G formula based on Cr of 1.57).   Medications:  Scheduled:  . amLODipine  10 mg Oral q morning - 10a  . arformoterol  15 mcg Nebulization BID  . budesonide  0.5 mg Nebulization BID  . [COMPLETED]  ceFAZolin (ANCEF) IV  3 g Intravenous On Call to OR  . [COMPLETED]  ceFAZolin (ANCEF) IV  2 g Intravenous Q6H  . [COMPLETED] dexamethasone  10 mg Intravenous Once  . enoxaparin (LOVENOX) injection  40 mg Subcutaneous Q24H  . famotidine  20 mg Oral Daily  . ferrous sulfate  325 mg Oral TID PC  . furosemide  40 mg Oral Daily  . HYDROcodone-acetaminophen  1-2 tablet Oral Q4H  . ipratropium  500 mcg Nebulization QID  . potassium chloride  20 mEq Oral BID  . potassium citrate  10 mEq Oral BID  . sertraline  50 mg Oral QHS  . vitamin B-12  1,000 mcg Oral Daily  . [COMPLETED] warfarin  7.5 mg Oral Once  . Warfarin - Pharmacist Dosing Inpatient   Does not apply q1800  . [DISCONTINUED] bupivacaine liposome  20 mL Infiltration Once  . [DISCONTINUED] chlorhexidine  60 mL Topical Once   Infusions:  . sodium chloride 0.9 % 1,000 mL infusion 100 mL/hr at 11/08/12 1445    Assessment: 77 year old male s/p left TKA 5/6  On chronic warfarin 4.5mg  daily PTA for h/o pulmonary embolism.  INR 1.44 today after boosted  dose of 7.5mg  last night  H/H decreased as expected postop, platelets low but stable No bleeding/complications reported. Lovenox 40mg  sq q24h started today until INR > or = 1.8   Goal of Therapy:  INR 2-3 Monitor platelets by anticoagulation protocol: Yes   Plan:   Warfarin 4.5mg  today per usual home dose  Daily PT/INR while inpatient  Continue lovenox as ordered  Loralee Pacas, PharmD, BCPS Pager: [phone] 11/09/2012,10:23 AM

## 2012-11-09 NOTE — Progress Notes (Signed)
Clinical Social Work Department CLINICAL SOCIAL WORK PLACEMENT NOTE 11/09/2012  Patient:  Gary Boyer, Gary Boyer  Account Number:  [id number] Admit date:  11/08/2012  Clinical Social Worker:  Cori Razor, LCSW  Date/time:  11/09/2012 07:59 AM  Clinical Social Work is seeking post-discharge placement for this patient at the following level of care:   SKILLED NURSING   (*CSW will update this form in Epic as items are completed)   11/09/2012  Patient/family provided with [facility] Department of Clinical Social Work's list of facilities offering this level of care within the geographic area requested by the patient (or if unable, by the patient's family).  11/09/2012  Patient/family informed of their freedom to choose among providers that offer the needed level of care, that participate in Medicare, Medicaid or managed care program needed by the patient, have an available bed and are willing to accept the patient.  11/08/2012  Patient/family informed of [facility]' ownership interest in [facility], as well as of the fact that they are under no obligation to receive care at this facility.  PASARR submitted to EDS on 11/08/2012 PASARR number received from [employer] on 11/08/2012  FL2 transmitted to all facilities in geographic area requested by pt/family on  11/09/2012 FL2 transmitted to all facilities within larger geographic area on   Patient informed that his/her managed care company has contracts with or will negotiate with  certain facilities, including the following:     Patient/family informed of bed offers received:   Patient chooses bed at  Physician recommends and patient chooses bed at    Patient to be transferred to  on   Patient to be transferred to facility by   The following physician request were entered in Epic:   Additional Comments:  Cori Razor LCSW [phone]

## 2012-11-09 NOTE — Evaluation (Signed)
Occupational Therapy Evaluation Patient Details Name: Gary Boyer MRN: [id number] DOB: 21-May-1936 Today's Date: 11/09/2012 Time: 1191-4782 OT Time Calculation (min): 28 min  OT Assessment / Plan / Recommendation Clinical Impression  Pt is s/p L TKA and displays decreased safety and strength and will benefit from skilled OT services to improve ADL independence.     OT Assessment  Patient needs continued OT Services    Follow Up Recommendations  Home health OT;Supervision/Assistance - 24 hour    Barriers to Discharge      Equipment Recommendations  None recommended by OT    Recommendations for Other Services    Frequency  Min 2X/week    Precautions / Restrictions Precautions Precautions: Fall;Knee Restrictions Weight Bearing Restrictions: No LLE Weight Bearing: Weight bearing as tolerated        ADL  Eating/Feeding: Simulated;Independent Where Assessed - Eating/Feeding: Chair Grooming: Simulated;Wash/dry hands;Set up Where Assessed - Grooming: Supported sitting Upper Body Bathing: Simulated;Chest;Right arm;Left arm;Abdomen;Set up Where Assessed - Upper Body Bathing: Unsupported sitting Lower Body Bathing: Simulated;Minimal assistance Where Assessed - Lower Body Bathing: Supported sit to stand Upper Body Dressing: Simulated;Set up Where Assessed - Upper Body Dressing: Unsupported sitting Lower Body Dressing: Simulated;Minimal assistance Where Assessed - Lower Body Dressing: Supported sit to stand Toilet Transfer: Performed;Minimal assistance Toilet Transfer Method: Other (comment) (into bathroom with RW) Acupuncturist: Comfort height toilet;Grab bars Toileting - Clothing Manipulation and Hygiene: Simulated;Minimal assistance Where Assessed - Engineer, mining and Hygiene: Standing Equipment Used: Rolling walker ADL Comments: Pt tends to push walker too far in front of him despite constant cues to stay inside of walker. He is also quick with  his turns as he backs up to commode. Reinforced with pt need for safety and to follow recommendations to decrease fall risk    OT Diagnosis: Generalized weakness  OT Problem List: Decreased strength OT Treatment Interventions: Self-care/ADL training;Therapeutic activities;Patient/family education;DME and/or AE instruction   OT Goals Acute Rehab OT Goals OT Goal Formulation: With patient/family Time For Goal Achievement: 11/16/12 Potential to Achieve Goals: Good ADL Goals Pt Will Perform Grooming: with supervision;Standing at sink ADL Goal: Grooming - Progress: Goal set today Pt Will Transfer to Toilet: with supervision;Ambulation;Comfort height toilet;Other (comment) (vanity on L) ADL Goal: Toilet Transfer - Progress: Goal set today Pt Will Perform Toileting - Clothing Manipulation: with supervision;Standing ADL Goal: Toileting - Clothing Manipulation - Progress: Goal set today Additional ADL Goal #1: Pt will demonstrate good safety awareness with use of walker during functional ADL with min verbal cues. ADL Goal: Additional Goal #1 - Progress: Goal set today  Visit Information  Last OT Received On: 11/09/12 Assistance Needed: +1 PT/OT Co-Evaluation/Treatment: Yes    Subjective Data  Subjective: I am doing alright Patient Stated Goal: wants to go home when ready   Prior Functioning     Home Living Lives With: Spouse Available Help at Discharge: Family Type of Home: House Home Access: Stairs to enter Secretary/administrator of Steps: 3 Entrance Stairs-Rails: None Home Layout: Two level Alternate Level Stairs-Number of Steps: 18 Bathroom Shower/Tub: Tub/shower unit (has tub downstairs. no bathroom at all upstairs.) Bathroom Toilet: Standard (vanity on L) Home Adaptive Equipment: None Prior Function Level of Independence: Independent;Needs assistance Needs Assistance: Bathing Bath: Minimal Able to Take Stairs?: Yes Driving: Yes Communication Communication: No  difficulties         Vision/Perception     Cognition  Cognition Arousal/Alertness: Awake/alert Behavior During Therapy: WFL for tasks assessed/performed Overall Cognitive Status: Within Functional  Limits for tasks assessed    Extremity/Trunk Assessment Right Upper Extremity Assessment RUE ROM/Strength/Tone: [facility] for tasks assessed Left Upper Extremity Assessment LUE ROM/Strength/Tone: WFL for tasks assessed     Mobility Bed Mobility Bed Mobility: Supine to Sit Supine to Sit: 4: Min guard Transfers Transfers: Sit to Stand;Stand to Sit Sit to Stand: 4: Min guard;From bed Stand to Sit: 4: Min guard;To chair and min assist to descend to toilet safely with grab bar Details for Transfer Assistance: verbal cues for hand placement        Balance     End of Session OT - End of Session Activity Tolerance: Patient tolerated treatment well Patient left: in chair;with call bell/phone within reach;with family/visitor present  GO     Lennox Laity [phone] 11/09/2012, 11:38 AM

## 2012-11-09 NOTE — Progress Notes (Signed)
Physical Therapy Treatment Patient Details Name: Gary Boyer MRN: [id number] DOB: 02-Feb-1936 Today's Date: 11/09/2012 Time: 4098-1191 PT Time Calculation (min): 41 min  PT Assessment / Plan / Recommendation Comments on Treatment Session  Pt required Min assist for standing and Mod assist for stair negotiation this session. Pt states he prefers to d/c to SNF for rehab to regain independence with functional mobility. Recommend SNF.     Follow Up Recommendations  SNF     Does the patient have the potential to tolerate intense rehabilitation     Barriers to Discharge        Equipment Recommendations  Rolling walker with 5" wheels    Recommendations for Other Services OT consult  Frequency 7X/week   Plan Discharge plan needs to be updated    Precautions / Restrictions Precautions Precautions: Fall;Knee Restrictions Weight Bearing Restrictions: No LLE Weight Bearing: Weight bearing as tolerated   Pertinent Vitals/Pain 5/10 R knee    Mobility  Bed Mobility Bed Mobility: Supine to Sit Supine to Sit: 4: Min guard Transfers Transfers: Sit to Stand;Stand to Sit Sit to Stand: 4: Min assist;From bed Stand to Sit: 4: Min guard;To bed Details for Transfer Assistance: Min assist from lower surface. VCs safety, technique, hand placement.  Ambulation/Gait Ambulation/Gait Assistance: 4: Min assist; Hydrographic surveyor (Feet): 135 Feet Assistive device: Rolling walker Ambulation/Gait Assistance Details: Pt continues to require Max cueing for safe use of RW. Explained multiple time the issue of safety. Assist to stabilize intermittently.  Gait Pattern: Step-to pattern;Step-through pattern;Trunk flexed;Antalgic;Decreased stride length Stairs: Yes Stairs Assistance: 4: Min assist Stairs Assistance Details (indicate cue type and reason): Attempted to have pt perform steps with 1 crutch, 1 rail. Pt declined-stated "let me try it my way." Assist to stabilize. Then practiced steps  with RW to simulate entry into home-Mod assist to stabilize pt and RW.  Stair Management Technique: Forwards;With crutches;One rail Left Number of Stairs: 9    Exercises     PT Diagnosis:    PT Problem List:   PT Treatment Interventions:     PT Goals Acute Rehab PT Goals Pt will go Supine/Side to Sit: with supervision PT Goal: Supine/Side to Sit - Progress: Progressing toward goal Pt will go Sit to Stand: with supervision PT Goal: Sit to Stand - Progress: Progressing toward goal Pt will Ambulate: >150 feet;with supervision;with least restrictive assistive device PT Goal: Ambulate - Progress: Progressing toward goal Pt will Go Up / Down Stairs: Flight;with rail(s);with least restrictive assistive device PT Goal: Up/Down Stairs - Progress: Progressing toward goal Pt will Perform Home Exercise Program: with supervision, verbal cues required/provided PT Goal: Perform Home Exercise Program - Progress: Progressing toward goal  Visit Information  Last PT Received On: 11/09/12 Assistance Needed: +1    Subjective Data  Subjective: I think Im gonna have to go for therapy Patient Stated Goal: Regain independence. Rehab   Cognition       Balance     End of Session PT - End of Session Equipment Utilized During Treatment: Gait belt Activity Tolerance: Patient limited by fatigue Patient left: in bed;with call bell/phone within reach   GP     Rebeca Alert, MPT Pager: [phone]

## 2012-11-09 NOTE — Progress Notes (Signed)
Physical Therapy Treatment Patient Details Name: Gary Boyer MRN: [id number] DOB: Nov 05, 1935 Today's Date: 11/09/2012 Time: 4098-1191 PT Time Calculation (min): 33 min  PT Assessment / Plan / Recommendation Comments on Treatment Session  Progressing with mobility. Will plan to practice steps this afternoon to see if pt will be able to safely perform at home. Spoke with [employer] (PA) about pt discharging home tomorrow instead of today.     Follow Up Recommendations  Home health PT;SNF     Does the patient have the potential to tolerate intense rehabilitation     Barriers to Discharge        Equipment Recommendations  Rolling walker with 5" wheels    Recommendations for Other Services    Frequency 7X/week   Plan Discharge plan remains appropriate    Precautions / Restrictions Precautions Precautions: Fall;Knee Restrictions Weight Bearing Restrictions: No LLE Weight Bearing: Weight bearing as tolerated   Pertinent Vitals/Pain 6/10 L knee    Mobility  Bed Mobility Bed Mobility: Supine to Sit Supine to Sit: 4: Min guard Transfers Transfers: Sit to Stand;Stand to Sit Sit to Stand: 4: Min guard;From bed Stand to Sit: 4: Min guard;To chair/3-in-1 Details for Transfer Assistance: verbal cues for hand placement Ambulation/Gait Ambulation/Gait Assistance: 4: Min assist Ambulation Distance (Feet): 125 Feet Assistive device: Rolling walker Ambulation/Gait Assistance Details: Max cueing for safety, distance from RW. Pt does not want to comply with therapist's instructions. Assist to stabilize throughout ambulation.  Gait Pattern: Step-to pattern;Step-through pattern;Decreased stride length;Antalgic;Trunk flexed    Exercises Total Joint Exercises Ankle Circles/Pumps: AROM;Both;10 reps;Seated Quad Sets: AROM;Both;10 reps;Seated Heel Slides: AAROM;Left;10 reps;Seated Hip ABduction/ADduction: AROM;Left;10 reps;Seated Straight Leg Raises: AROM;Left;10 reps;Seated   PT Diagnosis:     PT Problem List:   PT Treatment Interventions:     PT Goals Acute Rehab PT Goals Pt will go Supine/Side to Sit: with supervision PT Goal: Supine/Side to Sit - Progress: Progressing toward goal Pt will go Sit to Stand: with supervision PT Goal: Sit to Stand - Progress: Progressing toward goal Pt will Ambulate: >150 feet;with supervision;with least restrictive assistive device PT Goal: Ambulate - Progress: Progressing toward goal Pt will Perform Home Exercise Program: with supervision, verbal cues required/provided PT Goal: Perform Home Exercise Program - Progress: Progressing toward goal  Visit Information  Last PT Received On: 11/09/12 Assistance Needed: +1 PT/OT Co-Evaluation/Treatment: Yes    Subjective Data  Subjective: I think I'll be alright Patient Stated Goal: Regain independence. Home   Cognition  Cognition Arousal/Alertness: Awake/alert Behavior During Therapy: WFL for tasks assessed/performed Overall Cognitive Status: Within Functional Limits for tasks assessed    Balance     End of Session PT - End of Session Activity Tolerance: Patient tolerated treatment well Patient left: in chair;with call bell/phone within reach;with family/visitor present   GP     Rebeca Alert, MPT Pager: [phone]

## 2012-11-09 NOTE — Progress Notes (Signed)
Clinical Social Work Department BRIEF PSYCHOSOCIAL ASSESSMENT 11/09/2012  Patient:  Gary Boyer, Gary Boyer     Account Number:  [id number]     Admit date:  11/08/2012  Clinical Social Worker:  Candie Chroman  Date/Time:  11/09/2012 07:25 AM  Referred by:  Physician  Date Referred:  11/09/2012 Referred for  SNF Placement   Other Referral:   Interview type:  Family Other interview type:    PSYCHOSOCIAL DATA Living Status:  WIFE Admitted from facility:   Level of care:   Primary support name:  Gary Boyer Primary support relationship to patient:  SPOUSE Degree of support available:   supportive    CURRENT CONCERNS Current Concerns  Post-Acute Placement   Other Concerns:    SOCIAL WORK ASSESSMENT / PLAN Pt is a 77 yr old gentleman living at home prior to hospitalization. CSW met with pt's family while pt was working with PT. PT has recommended HHPT vs SNF depending on pt's progression. CSW will initiate SNF serach in [place] / [place] and provide bed offers today. CSW will continue to follow to assist with d/c planning needs.   Assessment/plan status:  Psychosocial Support/Ongoing Assessment of Needs Other assessment/ plan:   HHPT vs SNF   Information/referral to community resources:   SNF list provided.    PATIENT'S/FAMILY'S RESPONSE TO PLAN OF CARE: Pt / family are undecided regarding d/c plan. Pt may need ST Rehab prior to returning home. Pt / family will consider options though pt would prefer to return home with [facility] services.   Cori Razor LCSW [phone]

## 2012-11-09 NOTE — Progress Notes (Signed)
   Subjective: 1 Day Post-Op Procedure(s) (LRB): LEFT TOTAL KNEE ARTHROPLASTY (Left)   Patient reports pain as mild, pain well controlled. No events throughout the night. Ready to be discharged home if does well with PT and pain stays well controlled.  Objective:   VITALS:   Filed Vitals:   11/09/12  BP: 105/64  Pulse: 65  Temp: 98.2 F (36.8 C)   Resp: 18    Neurovascular intact Dorsiflexion/Plantar flexion intact Incision: dressing C/D/I No cellulitis present Compartment soft  LABS  Recent Labs  11/08/12 1456 11/09/12 0507  HGB 13.2 11.3*  HCT 39.8 35.7*  WBC 6.2 8.9  PLT 121* 115*     Recent Labs  11/08/12 1456 11/09/12 0507  NA  --  137  K  --  4.6  BUN  --  19  CREATININE 1.58* 1.57*  GLUCOSE  --  116*     Assessment/Plan: 1 Day Post-Op Procedure(s) (LRB): LEFT TOTAL KNEE ARTHROPLASTY (Left) HV cath d/c'ed Foley cath d/c'ed Advance diet Up with therapy D/C IV fluids Discharge home with home health Follow up in 2 weeks at [facility]. Follow up with OLIN,Toy Samarin D in 2 weeks.  Contact information:  [facility] [address], [address] [place] [address] [phone]       Expected ABLA  Treated with iron and will observe  Morbid Obesity (BMI >40)  Estimated body mass index is 41.6 kg/(m^2) as calculated from the following:   Height as of this encounter: 6' 0.05" (1.83 m).   Weight as of this encounter: 139.3 kg (307 lb 1.6 oz). Patient also counseled that weight may inhibit the healing process Patient counseled that losing weight will help with future health issues      Gary Boyer. Gary Boyer   PAC  11/09/2012, 9:33 AM

## 2012-11-10 LAB — BASIC METABOLIC PANEL
BUN: 22 mg/dL (ref 6–23)
Creatinine, Ser: 1.55 mg/dL — ABNORMAL HIGH (ref 0.50–1.35)
GFR calc Af Amer: 49 mL/min — ABNORMAL LOW (ref >90)
GFR calc non Af Amer: 42 mL/min — ABNORMAL LOW (ref >90)
Potassium: 4.4 mEq/L (ref 3.5–5.1)

## 2012-11-10 LAB — CBC
HCT: 33.9 % — ABNORMAL LOW (ref 39.0–52.0)
MCHC: 31 g/dL (ref 30.0–36.0)
Platelets: 120 10*3/uL — ABNORMAL LOW (ref 150–400)
RDW: 15.2 % (ref 11.5–15.5)

## 2012-11-10 LAB — PROTIME-INR: Prothrombin Time: 19.8 seconds — ABNORMAL HIGH (ref 11.6–15.2)

## 2012-11-10 MED ORDER — WARFARIN SODIUM 4 MG PO TABS
4.5000 mg | ORAL_TABLET | Freq: Once | ORAL | Status: AC
  Administered 2012-11-10: 4.5 mg via ORAL
  Filled 2012-11-10: qty 1

## 2012-11-10 NOTE — Progress Notes (Signed)
Physical Therapy Treatment Patient Details Name: Gary Boyer MRN: [id number] DOB: Jan 27, 1936 Today's Date: 11/10/2012 Time: 4098-1191 PT Time Calculation (min): 28 min  PT Assessment / Plan / Recommendation Comments on Treatment Session  Progressing with ambulation though pt still demonstrates decreased safety with use of RW. Recommend SNF    Follow Up Recommendations  SNF     Does the patient have the potential to tolerate intense rehabilitation     Barriers to Discharge        Equipment Recommendations  Rolling walker with 5" wheels    Recommendations for Other Services OT consult  Frequency 7X/week   Plan Discharge plan remains appropriate    Precautions / Restrictions Precautions Precautions: Fall;Knee Restrictions Weight Bearing Restrictions: No LLE Weight Bearing: Weight bearing as tolerated   Pertinent Vitals/Pain 5/10 L knee    Mobility  Bed Mobility Bed Mobility: Not assessed Transfers Transfers: Sit to Stand;Stand to Sit Sit to Stand: 4: Min guard;With upper extremity assist;From chair/3-in-1;From toilet Stand to Sit: 4: Min assist;With upper extremity assist;To toilet Details for Transfer Assistance: verbal cues for safety; min guard to sit on chair Ambulation/Gait Ambulation/Gait Assistance: 4: Min guard Ambulation Distance (Feet): 140 Feet Assistive device: Rolling walker Ambulation/Gait Assistance Details: Pt continues to require Mod-Max cueing for safe distance/use of RW. Pt keeps walker too far ahead and moves too quickly at times. Multiple attempts to keep walker at closer distance for improved stability and safety but pt prefers to perform "his way" Gait Pattern: Step-to pattern;Step-through pattern;Trunk flexed;Decreased stride length    Exercises     PT Diagnosis:    PT Problem List:   PT Treatment Interventions:     PT Goals Acute Rehab PT Goals Pt will go Sit to Stand: with supervision PT Goal: Sit to Stand - Progress: Progressing toward  goal Pt will Ambulate: >150 feet;with supervision;with least restrictive assistive device PT Goal: Ambulate - Progress: Progressing toward goal  Visit Information  Last PT Received On: 11/10/12 Assistance Needed: +1 PT/OT Co-Evaluation/Treatment: Yes    Subjective Data  Subjective: I haven't talked to SW today Patient Stated Goal: Regain independence. Rehab   Cognition  Cognition Arousal/Alertness: Awake/alert (Simultaneous filing. User may not have seen previous data.) Behavior During Therapy: Impulsive (despite cues, still doesnt adhere to safety recommendations Simultaneous filing. User may not have seen previous data.) Overall Cognitive Status: Within Functional Limits for tasks assessed (Simultaneous filing. User may not have seen previous data.)    Balance     End of Session PT - End of Session Activity Tolerance: Patient tolerated treatment well Patient left: in chair;with call bell/phone within reach   GP     Rebeca Alert, MPT Pager: [phone]

## 2012-11-10 NOTE — Progress Notes (Signed)
Occupational Therapy Treatment Patient Details Name: Gary Boyer MRN: [id number] DOB: [birth year] Today's Date: 11/10/2012 Time: 6213-0865 OT Time Calculation (min): 21 min  OT Assessment / Plan / Recommendation Comments on Treatment Session Pt now planning SNF at discharge. Continued to work on safety wtih toilet transfers.     Follow Up Recommendations  SNF;Supervision/Assistance - 24 hour (per chart, pt planning SNF now)    Barriers to Discharge       Equipment Recommendations  3 in 1 bedside comode (wide)    Recommendations for Other Services    Frequency Min 2X/week   Plan Discharge plan needs to be updated    Precautions / Restrictions Precautions Precautions: Fall;Knee Restrictions Weight Bearing Restrictions: No LLE Weight Bearing: Weight bearing as tolerated        ADL  Toilet Transfer: Performed;Minimal assistance Toilet Transfer Method: Other (comment) (with walker into bathroom. min guard to stand from commode) Acupuncturist: Comfort height toilet;Grab bars Equipment Used: Rolling walker ADL Comments: Wife not present so wasnt able to determine commode height at home but note pt now planning SNF. Pt still having difficulty with descending safely to comfort  height comode with grab bar use. Requires min assist to control descent and it takes increased effort to both sit and stand with comfort height comode. Will benefit from wide 3in1 to use while in hospital. Let secretary know at front to call portable for larger 3in1. Pt continues to push walker TOO far in front of him despite constant cueing. He also let go of walker and tried to steer RW with one hand as he stepped back to commode. Very difficult to get pt to follow safety recommendations.     OT Diagnosis:    OT Problem List:   OT Treatment Interventions:     OT Goals ADL Goals ADL Goal: Toilet Transfer - Progress: Not progressing (still at min assist)  Visit Information  Last OT Received On:  11/10/12 Assistance Needed: +1 PT/OT Co-Evaluation/Treatment: Yes    Subjective Data  Subjective: how are you all doing Patient Stated Goal: none stated. agreeable to therapy   Prior Functioning       Cognition  Cognition Arousal/Alertness: Awake/alert (Simultaneous filing. User may not have seen previous data.) Behavior During Therapy: Impulsive (despite cues, still doesnt adhere to safety recommendations Simultaneous filing. User may not have seen previous data.) Overall Cognitive Status: Within Functional Limits for tasks assessed (Simultaneous filing. User may not have seen previous data.)    Mobility  Transfers Transfers: Sit to Stand;Stand to Sit Sit to Stand: 4: Min guard;With upper extremity assist;From chair/3-in-1;From toilet Stand to Sit: 4: Min assist;With upper extremity assist;To toilet Details for Transfer Assistance: verbal cues for safety; min guard to sit on chair    Exercises      Balance     End of Session OT - End of Session Activity Tolerance: Patient tolerated treatment well Patient left: in chair;with call bell/phone within reach  GO     Lennox Laity [phone]. 11/10/2012, 11:54 AM

## 2012-11-10 NOTE — Progress Notes (Signed)
Physical Therapy Treatment Patient Details Name: Gary Boyer MRN: [id number] DOB: 07-24-1935 Today's Date: 11/10/2012 Time: 7829-5621 PT Time Calculation (min): 25 min  PT Assessment / Plan / Recommendation Comments on Treatment Session  Plans is for SNF possibly tomorrow. Pt continues to demonstrate poor technique, safety with ambulation.     Follow Up Recommendations  SNF     Does the patient have the potential to tolerate intense rehabilitation     Barriers to Discharge        Equipment Recommendations  Rolling walker with 5" wheels    Recommendations for Other Services OT consult  Frequency 7X/week   Plan Discharge plan remains appropriate    Precautions / Restrictions Precautions Precautions: Fall;Knee Restrictions Weight Bearing Restrictions: No LLE Weight Bearing: Weight bearing as tolerated   Pertinent Vitals/Pain 5/10 L knee    Mobility  Bed Mobility Bed Mobility: Sit to Supine Sit to Supine: 4: Min guard Details for Bed Mobility Assistance: Pt preferred to crawl onto bed.  Transfers Transfers: Sit to Stand;Stand to Sit Sit to Stand: 4: Min guard;From chair/3-in-1 Stand to Sit: 4: Min guard;To bed Details for Transfer Assistance: VCS safety, hand placement Ambulation/Gait Ambulation/Gait Assistance: 4: Min guard Ambulation Distance (Feet): 145 Feet Assistive device: Rolling walker Ambulation/Gait Assistance Details: Pt continues to require Mod-Max cueing for safe distance/use of RW. Pt keeps walker too far ahead and moves too quickly at times. Multiple attempts to keep walker closer to pt for safety/improved stability but pt prefers to perform "his way" Gait Pattern: Step-to pattern;Step-through pattern;Decreased stride length;Trunk flexed    Exercises Total Joint Exercises Ankle Circles/Pumps: AROM;Both;10 reps;Supine Quad Sets: AROM;Both;10 reps;Supine Heel Slides: AAROM;Left;10 reps;Supine Hip ABduction/ADduction: AROM;Left;10 reps;Supine Straight  Leg Raises: AROM;Left;10 reps;Supine   PT Diagnosis:    PT Problem List:   PT Treatment Interventions:     PT Goals Acute Rehab PT Goals Pt will go Sit to Supine/Side: with supervision PT Goal: Sit to Supine/Side - Progress: Progressing toward goal Pt will go Sit to Stand: with supervision PT Goal: Sit to Stand - Progress: Progressing toward goal Pt will Ambulate: >150 feet;with supervision;with least restrictive assistive device PT Goal: Ambulate - Progress: Progressing toward goal Pt will Perform Home Exercise Program: with supervision, verbal cues required/provided PT Goal: Perform Home Exercise Program - Progress: Progressing toward goal  Visit Information  Last PT Received On: 11/10/12 Assistance Needed: +1 PT/OT Co-Evaluation/Treatment: Yes    Subjective Data  Subjective: I haven't talked to SW today Patient Stated Goal: Regain independence. Rehab   Cognition  Cognition Arousal/Alertness: Awake/alert (Simultaneous filing. User may not have seen previous data.) Behavior During Therapy: Impulsive (despite cues, still doesnt adhere to safety recommendations Simultaneous filing. User may not have seen previous data.) Overall Cognitive Status: Within Functional Limits for tasks assessed (Simultaneous filing. User may not have seen previous data.)    Balance     End of Session PT - End of Session Activity Tolerance: Patient tolerated treatment well Patient left: in bed;with call bell/phone within reach   GP     Rebeca Alert, MPT Pager: [phone]

## 2012-11-10 NOTE — Progress Notes (Signed)
ANTICOAGULATION CONSULT NOTE - Follow Up Consult  Pharmacy Consult for Warfarin Indication: Hx PE, postop VTE prophylaxis  Allergies  Allergen Reactions  . Morphine And Related     HALLUCINATIONS    Patient Measurements: Height: 6' 0.05" (183 cm) Weight: 307 lb 1.6 oz (139.3 kg) IBW/kg (Calculated) : 77.71  Vital Signs: Temp: 98.2 F (36.8 C) (05/08 0457) Temp src: Oral (05/08 0457) BP: 103/48 mmHg (05/08 0457) Pulse Rate: 66 (05/08 0457)  Labs:  Recent Labs  11/08/12 0915  11/08/12 1456 11/09/12 0507 11/10/12 0515  HGB  --   < > 13.2 11.3* 10.5*  HCT  --   --  39.8 35.7* 33.9*  PLT  --   --  121* 115* 120*  LABPROT 16.6*  --   --  17.2* 19.8*  INR 1.38  --   --  1.44 1.75*  CREATININE  --   --  1.58* 1.57* 1.55*  < > = values in this interval not displayed.  Estimated Creatinine Clearance: 60.5 ml/min (by C-G formula based on Cr of 1.55).   Medications:  Scheduled:  . amLODipine  10 mg Oral q morning - 10a  . arformoterol  15 mcg Nebulization BID  . budesonide  0.5 mg Nebulization BID  . enoxaparin (LOVENOX) injection  40 mg Subcutaneous Q24H  . famotidine  20 mg Oral Daily  . ferrous sulfate  325 mg Oral TID PC  . furosemide  40 mg Oral Daily  . HYDROcodone-acetaminophen  1-2 tablet Oral Q4H  . ipratropium  500 mcg Nebulization QID  . polyethylene glycol  17 g Oral Daily  . potassium chloride  20 mEq Oral BID  . potassium citrate  10 mEq Oral BID  . sertraline  50 mg Oral QHS  . vitamin B-12  1,000 mcg Oral Daily  . [COMPLETED] warfarin  4.5 mg Oral ONCE-1800  . Warfarin - Pharmacist Dosing Inpatient   Does not apply q1800   Infusions:  . sodium chloride 0.9 % 1,000 mL infusion Stopped (11/09/12 1019)    Assessment: 77 year old male s/p left TKA 5/6  On chronic warfarin 4.5mg  daily PTA for h/o pulmonary embolism,  Last dose warfarin PTA was 5/1 in anticipation of surgery  INR moving toward goal during re-iniation of warfarin   H/H decreased as  expected postop, platelets low but stable No bleeding/complications reported. Lovenox 40mg  sq q24h started today until INR > or = 1.8   Goal of Therapy:  INR 2-3 Monitor platelets by anticoagulation protocol: Yes   Plan:   Warfarin 4.5 mg today per usual home dose as INR trending toward goal  Daily PT/INR while inpatient  Continue lovenox as ordered - anticipate discontinuation tomorrow   Clydene Fake PharmD Pager #: [phone] 9:11 AM 11/10/2012

## 2012-11-10 NOTE — Care Management Note (Signed)
    Page 1 of 1   11/10/2012     3:05:48 PM   CARE MANAGEMENT NOTE 11/10/2012  Patient:  Gary Boyer, Gary Boyer   Account Number:  [id number]  Date Initiated:  11/10/2012  Documentation initiated by:  Colleen Can  Subjective/Objective Assessment:   dx osteoarthritis knee-left; total knee replacemnt     Action/Plan:   Initially pt advised CM  that he was planning to go home with spouse. Today  pt states he and spouse have decided that he will go to SNF rehab.   Anticipated DC Date:  11/11/2012   Anticipated DC Plan:  SKILLED NURSING FACILITY  In-house referral  Clinical Social Worker      DC Planning Services  CM consult      Choice offered to / List presented to:             Status of service:  Completed, signed off Medicare Important Message given?  NA - LOS <3 / Initial given by admissions (If response is "NO", the following Medicare IM given date fields will be blank) Date Medicare IM given:   Date Additional Medicare IM given:    Discharge Disposition:    Per UR Regulation:    If discussed at Long Length of Stay Meetings, dates discussed:    Comments:

## 2012-11-10 NOTE — Progress Notes (Signed)
   Subjective: 2 Days Post-Op Procedure(s) (LRB): LEFT TOTAL KNEE ARTHROPLASTY (Left)   Patient reports pain as mild, pain well controlled. Working a little slow with PT. No events throughout the night.  Objective:   VITALS:   Filed Vitals:   11/10/12 0457  BP: 103/48  Pulse: 66  Temp: 98.2 F (36.8 C)  Resp: 18    Neurovascular intact Dorsiflexion/Plantar flexion intact Incision: dressing C/D/I No cellulitis present Compartment soft  LABS  Recent Labs  11/08/12 1456 11/09/12 0507 11/10/12 0515  HGB 13.2 11.3* 10.5*  HCT 39.8 35.7* 33.9*  WBC 6.2 8.9 7.0  PLT 121* 115* 120*     Recent Labs  11/08/12 1456 11/09/12 0507 11/10/12 0515  NA  --  137 139  K  --  4.6 4.4  BUN  --  19 22  CREATININE 1.58* 1.57* 1.55*  GLUCOSE  --  116* 111*     Assessment/Plan: 2 Days Post-Op Procedure(s) (LRB): LEFT TOTAL KNEE ARTHROPLASTY (Left) Up with therapy Discharge to SNF possibly tomorrow if continued  progress  Expected ABLA  Treated with iron and will observe   Morbid Obesity (BMI >40)  Estimated body mass index is 41.6 kg/(m^2) as calculated from the following:      Height as of this encounter: 6' 0.05" (1.83 m).      Weight as of this encounter: 139.3 kg (307 lb 1.6 oz).  Patient also counseled that weight may inhibit the healing process  Patient counseled that losing weight will help with future health issues        Anastasio Auerbach. Charrie Mcconnon   PAC  11/10/2012, 8:18 AM

## 2012-11-11 MED ORDER — HYDROCODONE-ACETAMINOPHEN 5-325 MG PO TABS: 1.0000 | ORAL_TABLET | ORAL | Status: DC | PRN

## 2012-11-11 MED ORDER — GUAIFENESIN ER 600 MG PO TB12
600.0000 mg | ORAL_TABLET | Freq: Two times a day (BID) | ORAL | Status: DC
  Administered 2012-11-11: 600 mg via ORAL
  Filled 2012-11-11 (×2): qty 1

## 2012-11-11 MED ORDER — METHOCARBAMOL 500 MG PO TABS: 500.0000 mg | ORAL_TABLET | Freq: Four times a day (QID) | ORAL | Status: DC | PRN

## 2012-11-11 MED ORDER — GUAIFENESIN ER 600 MG PO TB12: 600.0000 mg | ORAL_TABLET | Freq: Two times a day (BID) | ORAL | Status: DC | PRN

## 2012-11-11 MED ORDER — WARFARIN SODIUM 4 MG PO TABS: 4.0000 mg | ORAL_TABLET | Freq: Every day | ORAL | Status: DC

## 2012-11-11 MED ORDER — POLYETHYLENE GLYCOL 3350 17 G PO PACK: 17.0000 g | PACK | Freq: Every day | ORAL | Status: DC

## 2012-11-11 NOTE — Progress Notes (Signed)
Physical Therapy Treatment Patient Details Name: Gary Boyer MRN: [id number] DOB: [birth year] Today's Date: 11/11/2012 Time: 7829-5621 PT Time Calculation (min): 23 min  PT Assessment / Plan / Recommendation Comments on Treatment Session  Pt continues to demonstrate poor technique, safety with ambulation.  Plan is for [facility] today.     Follow Up Recommendations  SNF     Does the patient have the potential to tolerate intense rehabilitation     Barriers to Discharge        Equipment Recommendations  Rolling walker with 5" wheels    Recommendations for Other Services OT consult  Frequency 7X/week   Plan Discharge plan remains appropriate    Precautions / Restrictions Precautions Precautions: Knee;Fall Restrictions Weight Bearing Restrictions: No LLE Weight Bearing: Weight bearing as tolerated   Pertinent Vitals/Pain 6/10 L knee with activity    Mobility  Bed Mobility Bed Mobility: Supine to Sit;Sit to Supine Supine to Sit: 4: Min guard Sit to Supine: 4: Min guard Details for Bed Mobility Assistance: Pt preferred to crawl onto bed.  Transfers Transfers: Sit to Stand;Stand to Sit Sit to Stand: 4: Min guard;From elevated surface Stand to Sit: 4: Min guard Details for Transfer Assistance: VCS safety, hand placement Ambulation/Gait Ambulation/Gait Assistance: 4: Min guard Ambulation Distance (Feet): 148 Feet Ambulation/Gait Assistance Details: 2 brief standing rest breaks this session. Pt continues to require cues for safe use/distance of RW.  Gait Pattern: Step-to pattern;Step-through pattern;Decreased stride length;Trunk flexed    Exercises Total Joint Exercises Ankle Circles/Pumps: AROM;Both;15 reps;Supine Quad Sets: AROM;10 reps;Both;Supine Short Arc Quad: AROM;Left;10 reps;Supine Heel Slides: AAROM;Left;10 reps;Supine Hip ABduction/ADduction: AROM;Left;10 reps;Supine Straight Leg Raises: AROM;Left;10 reps;Supine Goniometric ROM: 5-48 degrees supine   PT Diagnosis:     PT Problem List:   PT Treatment Interventions:     PT Goals Acute Rehab PT Goals Pt will go Supine/Side to Sit: with supervision PT Goal: Supine/Side to Sit - Progress: Progressing toward goal Pt will go Sit to Supine/Side: with supervision PT Goal: Sit to Supine/Side - Progress: Progressing toward goal Pt will go Sit to Stand: with supervision PT Goal: Sit to Stand - Progress: Progressing toward goal Pt will Ambulate: >150 feet;with supervision;with least restrictive assistive device PT Goal: Ambulate - Progress: Progressing toward goal Pt will Perform Home Exercise Program: with supervision, verbal cues required/provided PT Goal: Perform Home Exercise Program - Progress: Progressing toward goal  Visit Information  Last PT Received On: 11/11/12 Assistance Needed: +1    Subjective Data  Subjective: Im supposed to be going t [employer] Patient Stated Goal: Regain independence. Rehab   Cognition  Cognition Arousal/Alertness: Awake/alert Behavior During Therapy: WFL for tasks assessed/performed Overall Cognitive Status: Within Functional Limits for tasks assessed    Balance     End of Session PT - End of Session Activity Tolerance: Patient tolerated treatment well Patient left: in bed;with call bell/phone within reach;with family/visitor present   GP     Rebeca Alert, MPT Pager: [phone]

## 2012-11-11 NOTE — Progress Notes (Signed)
   Subjective: 3 Days Post-Op Procedure(s) (LRB): LEFT TOTAL KNEE ARTHROPLASTY (Left)   Patient reports pain as mild, pain well controlled. No events throughout the night. Ready to be discharged to SNF.  Objective:   VITALS:   Filed Vitals:   11/11/12 0700  BP: 111/59  Pulse: 62  Temp: 97.8 F (36.6 C)  Resp: 16    Neurovascular intact Dorsiflexion/Plantar flexion intact Incision: dressing C/D/I No cellulitis present Compartment soft  LABS  Recent Labs  11/08/12 1456 11/09/12 0507 11/10/12 0515  HGB 13.2 11.3* 10.5*  HCT 39.8 35.7* 33.9*  WBC 6.2 8.9 7.0  PLT 121* 115* 120*     Recent Labs  11/08/12 1456 11/09/12 0507 11/10/12 0515  NA  --  137 139  K  --  4.6 4.4  BUN  --  19 22  CREATININE 1.58* 1.57* 1.55*  GLUCOSE  --  116* 111*     Assessment/Plan: 3 Days Post-Op Procedure(s) (LRB): LEFT TOTAL KNEE ARTHROPLASTY (Left) Up with therapy Discharge to SNF today Follow up in 2 weeks at [facility]. Follow up with OLIN,Fletcher Rathbun D in 2 weeks.  Contact information:  [facility] [address] [place] [address] [phone]    Morbid Obesity (BMI >40)  Estimated body mass index is 41.6 kg/(m^2) as calculated from the following:      Height as of this encounter: 6' 0.05" (1.83 m).      Weight as of this encounter: 139.3 kg (307 lb 1.6 oz).  Patient also counseled that weight may inhibit the healing process  Patient counseled that losing weight will help with future health issues        Anastasio Auerbach. Joelly Bolanos   PAC  11/11/2012, 7:34 AM

## 2012-11-11 NOTE — Discharge Summary (Signed)
Physician Discharge Summary  Patient ID: Brance Dartt MRN: [id number] DOB/AGE: [birth year] 77 y.o.  Admit date: 11/08/2012 Discharge date:  11/11/2012  Procedures:  Procedure(s) (LRB): LEFT TOTAL KNEE ARTHROPLASTY (Left)  Attending Physician:  Dr. Durene Romans   Admission Diagnoses:   Left knee OA / pain  Discharge Diagnoses:  Principal Problem:   S/P left TKA Active Problems:   Expected blood loss anemia   Morbid obesity  Past Medical History  Diagnosis Date  . Dysrhythmia     PAST HX OF HEART ABLATION FOR IRREGULAR HEART RATE --SUCCESSFUL-NO FURTHER PROB WITH IRREGULAR HEART BEAT  . COPD (chronic obstructive pulmonary disease)     HX OF SMOKING FOR 60 YRS-PT SEES DR. Talmadge Coventry IN [place]   . Shortness of breath     WITH ANY EXERTION-USES OXYGEN AS NEEDED  . Pneumonia     IN THE PAST - BUT NOT IN THE LAST YR  . Asthma   . Pulmonary embolus 2011 OR 2012    CHRONIC WARFARIN SINCE THE BLOOD CLOT  . Bilateral leg edema     WEARS SUPPORT HOSE  . History of kidney stones     SOME SMALL STONES AT PRESENT TIME- TAKES POTASSIUM CITRATE  . H/O hiatal hernia   . GERD (gastroesophageal reflux disease)   . Cancer     SKIN CANCERS  . Arthritis     ALL OVER  . Sleep apnea     USES BIPAP  SETTING 16 / 11 - PER PULMONOGIST'S OFFICE NOTE    HPI: Inez Pilgrim, 77 y.o. male, has a history of pain and functional disability in the left knee due to arthritis and patient has failed non-surgical conservative treatments for greater than 12 weeks to include NSAID's and/or analgesics and activity modification. Onset of symptoms was gradual starting 5 years ago with gradually worsening course since that time.The patient noted no prior procedures of the left knee. Patient currently rates pain in the left hip at 7 out of 10 with activity. Patient has worsening of pain at night, with activity and weight bearing, trendelenberg gait, pain that interfers with activities of daily living, pain with  passive range of motion and crepitus. Patient has evidence of periarticular osteophytes and joint space narrowing by imaging studies. This condition presents safety issues increasing the risk of falls. There is no current active infection. Risks, benefits and expectations were discussed with the patient. Patient understand the risks, benefits and expectations and wishes to proceed with surgery.  PCP: No primary provider on file.   Discharged Condition: good  Hospital Course:  Patient underwent the above stated procedure on 11/08/2012. Patient tolerated the procedure well and brought to the recovery room in good condition and subsequently to the floor.  POD #1 BP: 105/64 ; Pulse: 65 ; Temp: 98.2 F (36.8 C) ; Resp: 18  Pt's foley was removed, as well as the hemovac drain removed. IV was changed to a saline lock. Patient reports pain as mild, pain well controlled. No events throughout the night. After having PT, he felt that he would most likely benefit from placement in a SNF. Neurovascular intact, dorsiflexion/plantar flexion intact, incision: dressing C/D/I, no cellulitis present and compartment soft.   LABS  Basename  11/09/12    0507  HGB  11.3  HCT  35.7   POD #2  BP: 103/48 ; Pulse: 66 ; Temp: 98.2 F (36.8 C) ; Resp: 18  Patient reports pain as mild, pain well controlled. Working a little  slow with PT. No events throughout the night. Neurovascular intact, dorsiflexion/plantar flexion intact, incision: dressing C/D/I, no cellulitis present and compartment soft.   LABS  Basename  11/10/12    0515   HGB  10.5  HCT  33.9   POD #3  BP: 111/59 ; Pulse: 62 ; Temp: 97.8 F (36.6 C) ; Resp: 16  Patient reports pain as mild, pain well controlled. No events throughout the night. Ready to be discharged to SNF. Neurovascular intact, dorsiflexion/plantar flexion intact, incision: dressing C/D/I, no cellulitis present and compartment soft.   LABS   No new labs  Discharge Exam: General  appearance: alert, cooperative and no distress Extremities: Homans sign is negative, no sign of DVT, no edema, redness or tenderness in the calves or thighs and no ulcers, gangrene or trophic changes  Disposition: SNF  with follow up in 2 weeks   Follow-up Information   Follow up with Shelda Pal, MD. Schedule an appointment as soon as possible for a visit in 2 weeks.   Contact information:   [address] [place] [address] [phone]       Discharge Orders   Future Orders Complete By Expires     Call MD / Call 911  As directed     Comments:      If you experience chest pain or shortness of breath, CALL 911 and be transported to the hospital emergency room.  If you develope a fever above 101 F, pus (white drainage) or increased drainage or redness at the wound, or calf pain, call your surgeon's office.    Change dressing  As directed     Comments:      Maintain surgical dressing for 10-14 days, then change the dressing daily with sterile 4 x 4 inch gauze dressing and tape. Keep the area dry and clean.    Constipation Prevention  As directed     Comments:      Drink plenty of fluids.  Prune juice may be helpful.  You may use a stool softener, such as Colace (over the counter) 100 mg twice a day.  Use MiraLax (over the counter) for constipation as needed.    Diet - low sodium heart healthy  As directed     Discharge instructions  As directed     Comments:      Maintain surgical dressing for 10-14 days, then replace with gauze and tape. Keep the area dry and clean until follow up. Follow up in 2 weeks at [facility]. Call with any questions or concerns.    Driving restrictions  As directed     Comments:      No driving for 4 weeks    Increase activity slowly as tolerated  As directed     TED hose  As directed     Comments:      Use stockings (TED hose) for 2 weeks on both leg(s).  You may remove them at night for sleeping.    Weight bearing as  tolerated  As directed          Medication List    TAKE these medications       albuterol (2.5 MG/3ML) 0.083% nebulizer solution  Commonly known as:  PROVENTIL  Take 2.5 mg by nebulization every 6 (six) hours as needed for wheezing.     amLODipine 10 MG tablet  Commonly known as:  NORVASC  Take 10 mg by mouth every morning.     arformoterol 15  MCG/2ML Nebu  Commonly known as:  BROVANA  Take 15 mcg by nebulization 2 (two) times daily.     budesonide 0.5 MG/2ML nebulizer solution  Commonly known as:  PULMICORT  Take 0.5 mg by nebulization 2 (two) times daily.     cholecalciferol 1000 UNITS tablet  Commonly known as:  VITAMIN D  Take 1,000 Units by mouth daily.     enalapril 10 MG tablet  Commonly known as:  VASOTEC  Take 10 mg by mouth 2 (two) times daily.     enoxaparin 40 MG/0.4ML injection  Commonly known as:  LOVENOX  Inject 0.4 mLs (40 mg total) into the skin daily.     ferrous sulfate 325 (65 FE) MG tablet  Take 1 tablet (325 mg total) by mouth 3 (three) times daily after meals.     furosemide 40 MG tablet  Commonly known as:  LASIX  Take 40 mg by mouth daily.     HYDROcodone-acetaminophen 5-325 MG per tablet  Commonly known as:  NORCO/VICODIN  Take 1-2 tablets by mouth every 4 (four) hours as needed for pain.     ipratropium 0.02 % nebulizer solution  Commonly known as:  ATROVENT  Take 500 mcg by nebulization 4 (four) times daily.     methocarbamol 500 MG tablet  Commonly known as:  ROBAXIN  Take 1 tablet (500 mg total) by mouth every 6 (six) hours as needed (muscle spasms).     polyethylene glycol packet  Commonly known as:  MIRALAX / GLYCOLAX  Take 17 g by mouth daily.     potassium chloride 10 MEQ tablet  Commonly known as:  K-DUR,KLOR-CON  Take 20 mEq by mouth 2 (two) times daily.     potassium citrate 10 MEQ (1080 MG) SR tablet  Commonly known as:  UROCIT-K  Take 10 mEq by mouth 2 (two) times daily.     ranitidine 150 MG tablet  Commonly  known as:  ZANTAC  Take 150 mg by mouth 2 (two) times daily.     sertraline 50 MG tablet  Commonly known as:  ZOLOFT  Take 50 mg by mouth at bedtime.     vitamin B-12 1000 MCG tablet  Commonly known as:  CYANOCOBALAMIN  Take 1,000 mcg by mouth daily.     warfarin 5 MG tablet  Commonly known as:  COUMADIN  Take 1.5 tablets (7.5 mg total) by mouth daily. Take with a 0.5 mg to make 4.5 mg         Signed: Anastasio Auerbach. Ketzia Guzek   PAC  11/11/2012, 7:39 AM

## 2012-11-11 NOTE — Progress Notes (Signed)
Clinical Social Work Department CLINICAL SOCIAL WORK PLACEMENT NOTE 11/11/2012  Patient:  Gary Boyer, Gary Boyer  Account Number:  [id number] Admit date:  11/08/2012  Clinical Social Worker:  Cori Razor, LCSW  Date/time:  11/09/2012 07:59 AM  Clinical Social Work is seeking post-discharge placement for this patient at the following level of care:   SKILLED NURSING   (*CSW will update this form in Epic as items are completed)   11/09/2012  Patient/family provided with [facility] Department of Clinical Social Work's list of facilities offering this level of care within the geographic area requested by the patient (or if unable, by the patient's family).  11/09/2012  Patient/family informed of their freedom to choose among providers that offer the needed level of care, that participate in Medicare, Medicaid or managed care program needed by the patient, have an available bed and are willing to accept the patient.  11/08/2012  Patient/family informed of [facility]' ownership interest in [facility], as well as of the fact that they are under no obligation to receive care at this facility.  PASARR submitted to EDS on 11/08/2012 PASARR number received from EDS on 11/08/2012  FL2 transmitted to all facilities in geographic area requested by pt/family on  11/09/2012 FL2 transmitted to all facilities within larger geographic area on   Patient informed that his/her managed care company has contracts with or will negotiate with  certain facilities, including the following:     Patient/family informed of bed offers received:  11/09/2012 Patient chooses bed at [facility] Physician recommends and patient chooses bed at    Patient to be transferred to [facility] on  11/11/2012 Patient to be transferred to facility by [facility]  The following physician request were entered in Epic:   Additional Comments:  Cori Razor LCSW [phone]

## 2012-11-17 ENCOUNTER — Non-Acute Institutional Stay (SKILLED_NURSING_FACILITY): Payer: Medicare Other | Admitting: Internal Medicine

## 2012-11-17 DIAGNOSIS — M1710 Unilateral primary osteoarthritis, unspecified knee: Secondary | ICD-10-CM

## 2012-11-17 DIAGNOSIS — D500 Iron deficiency anemia secondary to blood loss (chronic): Secondary | ICD-10-CM

## 2012-11-17 DIAGNOSIS — J449 Chronic obstructive pulmonary disease, unspecified: Secondary | ICD-10-CM

## 2012-11-17 DIAGNOSIS — J4489 Other specified chronic obstructive pulmonary disease: Secondary | ICD-10-CM

## 2012-11-17 DIAGNOSIS — I10 Essential (primary) hypertension: Secondary | ICD-10-CM

## 2012-11-24 ENCOUNTER — Non-Acute Institutional Stay (SKILLED_NURSING_FACILITY): Payer: Medicare Other | Admitting: Internal Medicine

## 2012-11-24 DIAGNOSIS — J449 Chronic obstructive pulmonary disease, unspecified: Secondary | ICD-10-CM

## 2012-11-24 DIAGNOSIS — D500 Iron deficiency anemia secondary to blood loss (chronic): Secondary | ICD-10-CM

## 2012-11-24 DIAGNOSIS — R609 Edema, unspecified: Secondary | ICD-10-CM

## 2012-12-08 DIAGNOSIS — I10 Essential (primary) hypertension: Secondary | ICD-10-CM | POA: Insufficient documentation

## 2012-12-08 DIAGNOSIS — M1710 Unilateral primary osteoarthritis, unspecified knee: Secondary | ICD-10-CM | POA: Insufficient documentation

## 2012-12-08 DIAGNOSIS — J449 Chronic obstructive pulmonary disease, unspecified: Secondary | ICD-10-CM | POA: Insufficient documentation

## 2012-12-08 DIAGNOSIS — J4489 Other specified chronic obstructive pulmonary disease: Secondary | ICD-10-CM | POA: Insufficient documentation

## 2012-12-08 NOTE — Progress Notes (Signed)
Patient ID: Gary Boyer, male   DOB: Jun 16, 1936, 77 y.o.   MRN: [id number]        HISTORY & PHYSICAL  DATE: 11/17/2012   FACILITY: [facility]  LEVEL OF CARE: SNF (31)  ALLERGIES:  Allergies  Allergen Reactions  . Morphine And Related     HALLUCINATIONS    CHIEF COMPLAINT:  Manage left knee osteoarthritis, acute blood loss anemia, and COPD.   HISTORY OF PRESENT ILLNESS:  The patient is a 77 year-old, Caucasian male.    KNEE OSTEOARTHRITIS: Patient had a history of pain and functional disability in the knee due to end-stage osteoarthritis and has failed nonsurgical conservative treatments. Patient had worsening of pain with activity and weight bearing, pain that interfered with activities of daily living & pain with passive range of motion. Therefore patient underwent total knee arthroplasty and tolerated the procedure well. Patient is admitted to this facility for sort short-term rehabilitation. Patient denies knee pain.   ANEMIA: Postoperatively, patient suffered acute blood loss.  The anemia has been stable. The patient denies fatigue, melena or hematochezia. No complications from the medications currently being used.  Last hemoglobins are:  11.3, 10.5.    COPD: the COPD remains stable.  Pt denies sob, cough, wheezing or declining exercise tolerance.  No complications from the medications presently being used.   PAST MEDICAL HISTORY :  Past Medical History  Diagnosis Date  . Dysrhythmia     PAST HX OF HEART ABLATION FOR IRREGULAR HEART RATE --SUCCESSFUL-NO FURTHER PROB WITH IRREGULAR HEART BEAT  . COPD (chronic obstructive pulmonary disease)     HX OF SMOKING FOR 60 YRS-PT SEES DR. Talmadge Coventry IN [place]   . Shortness of breath     WITH ANY EXERTION-USES OXYGEN AS NEEDED  . Pneumonia     IN THE PAST - BUT NOT IN THE LAST YR  . Asthma   . Pulmonary embolus 2011 OR 2012    CHRONIC WARFARIN SINCE THE BLOOD CLOT  . Bilateral leg edema     WEARS SUPPORT  HOSE  . History of kidney stones     SOME SMALL STONES AT PRESENT TIME- TAKES POTASSIUM CITRATE  . H/O hiatal hernia   . GERD (gastroesophageal reflux disease)   . Cancer     SKIN CANCERS  . Arthritis     ALL OVER  . Sleep apnea     USES BIPAP  SETTING 16 / 11 - PER PULMONOGIST'S OFFICE NOTE    PAST SURGICAL HISTORY: Past Surgical History  Procedure Laterality Date  . Mass removed from stomach - benign  ABOUT 2008 ?  Marland Kitchen Heart ablation    . Multiple tooth extractions    . Tonsillectomy      T & A - AS A CHILD  . Total knee arthroplasty Left 11/08/2012    Procedure: LEFT TOTAL KNEE ARTHROPLASTY;  Surgeon: Shelda Pal, MD;  Location: [facility];  Service: Orthopedics;  Laterality: Left;    SOCIAL HISTORY:  reports that he has quit smoking. His smoking use included Cigarettes. He has a 120 pack-year smoking history. He has never used smokeless tobacco. He reports that he does not drink alcohol or use illicit drugs.  FAMILY HISTORY: None  CURRENT MEDICATIONS: Reviewed per MAR  REVIEW OF SYSTEMS:  See HPI otherwise 14 point ROS is negative.  PHYSICAL EXAMINATION  VS:  T 97.4        P 80      RR 18  BP 101/60      POX%        WT (Lb) 307  GENERAL: no acute distress, morbidly obese body habitus SKIN: warm & dry, no suspicious lesions or rashes, no excessive dryness, left knee incision clean and dry and closed EYES: conjunctivae normal, sclerae normal, normal eye lids MOUTH/THROAT: lips without lesions,no lesions in the mouth,tongue is without lesions,uvula elevates in midline NECK: supple, trachea midline, no neck masses, no thyroid tenderness, no thyromegaly LYMPHATICS: no LAN in the neck, no supraclavicular LAN RESPIRATORY: breathing is even & unlabored, BS CTAB CARDIAC: RRR, no murmur,no extra heart sounds EDEMA/VARICOSITIES:  +3 bilateral lower extremity edema, left greater than right ARTERIAL:  pedal pulses nonpalpable  GI:  ABDOMEN: abdomen soft, normal BS, no masses,  no tenderness  LIVER/SPLEEN: no hepatomegaly, no splenomegaly MUSCULOSKELETAL: HEAD: normal to inspection & palpation BACK: no kyphosis, scoliosis or spinal processes tenderness EXTREMITIES: LEFT UPPER EXTREMITY: full range of motion, normal strength & tone RIGHT UPPER EXTREMITY: strength intact, range of motion unable to assess secondary to shoulder problem LEFT LOWER EXTREMITY: strength intact, range of motion not tested due to surgery  RIGHT LOWER EXTREMITY:  full range of motion, normal strength & tone PSYCHIATRIC: the patient is alert & oriented to person, affect & behavior appropriate  LABS/RADIOLOGY: None received from hospitalization.    ASSESSMENT/PLAN:  Left knee osteoarthritis.  Status post left total knee arthroplasty.  Continue rehabilitation.   Acute blood loss anemia.  Continue iron.  Reassess hemoglobin level.   COPD.  Well compensated.   Hypertension.  Well controlled.   Constipation.  Continue current laxatives.   Hypokalemia.  Continue supplementation.  Reassess.   Check CBC and BMP.   I have reviewed patient's medical records received at admission/from hospitalization.  CPT CODE: [address]

## 2012-12-15 DIAGNOSIS — F17200 Nicotine dependence, unspecified, uncomplicated: Secondary | ICD-10-CM | POA: Insufficient documentation

## 2012-12-20 DIAGNOSIS — R609 Edema, unspecified: Secondary | ICD-10-CM | POA: Insufficient documentation

## 2012-12-20 NOTE — Progress Notes (Signed)
Patient ID: Gary Boyer, male   DOB: 01-28-1936, 77 y.o.   MRN: [id number]        PROGRESS NOTE  DATE: 11/24/2012   FACILITY: [facility]  LEVEL OF CARE: SNF (31)  Discharge Visit  CHIEF COMPLAINT:  Manage left knee osteoarthritis.  HISTORY OF PRESENT ILLNESS: I was requested by the social worker to perform face-to-face evaluation for discharge:  Patient was admitted to this facility for short-term rehabilitation after the patient's recent hospitalization.  Patient has completed SNF rehabilitation and therapy has cleared the patient for discharge.  Reassessment of ongoing problem(s):  KNEE OSTEOARTHRITIS: Patient had a history of pain and functional disability in the knee due to end-stage osteoarthritis and has failed nonsurgical conservative treatments. Patient had worsening of pain with activity and weight bearing, pain that interfered with activities of daily living & pain with passive range of motion. Therefore patient underwent total knee arthroplasty and tolerated the procedure well. Patient is admitted to this facility for sort short-term rehabilitation. Patient denies knee pain.   PAST MEDICAL HISTORY : Reviewed.  No changes.  CURRENT MEDICATIONS: Reviewed per [facility]  REVIEW OF SYSTEMS:  GENERAL: no change in appetite, no fatigue, no weight changes, no fever, chills or weakness RESPIRATORY: no cough, SOB, DOE, wheezing, hemoptysis CARDIAC: no chest pain, edema or palpitations GI: no abdominal pain, diarrhea, constipation, heart burn, nausea or vomiting  PHYSICAL EXAMINATION  VS:  T 98.5      P 68      RR 20     BP 112/64      POX %       WT (Lb) 298.8  GENERAL: no acute distress, morbidly obese body habitus NECK: supple, trachea midline, no neck masses, no thyroid tenderness, no thyromegaly RESPIRATORY: breathing is even & unlabored, BS CTAB CARDIAC: RRR, no murmur,no extra heart sounds EDEMA/VARICOSITIES:  +2 bilateral lower extremity edema   ARTERIAL:  pedal pulses nonpalpable  GI: abdomen soft, normal BS, no masses, no tenderness, no hepatomegaly, no splenomegaly PSYCHIATRIC: the patient is alert & oriented to person, affect & behavior appropriate  LABS/RADIOLOGY: None available from this facility.    ASSESSMENT/PLAN:  Left knee osteoarthritis.  Status post left total knee arthroplasty.  Continue home health rehabilitation.    COPD.  Well compensated.   Acute blood loss anemia.  Continue iron.  Follow up with orthopedic surgeon.    Lower extremity edema.  Continue Lasix.    Constipation.  Continue MiraLAX.    I have filled out patient's discharge paperwork and written prescriptions.  Patient will receive home health PT, OT, and nursing. DME provided:  None.   Total discharge time: Less than 30 minutes Discharge time involved coordination of the discharge process with Child psychotherapist, nursing staff and therapy department. Medical justification for home health services/DME verified.  CPT CODE: 40981

## 2013-03-17 DIAGNOSIS — Z7901 Long term (current) use of anticoagulants: Secondary | ICD-10-CM | POA: Insufficient documentation

## 2013-04-20 DIAGNOSIS — F329 Major depressive disorder, single episode, unspecified: Secondary | ICD-10-CM | POA: Insufficient documentation

## 2015-01-11 ENCOUNTER — Inpatient Hospital Stay (HOSPITAL_COMMUNITY): Payer: Medicare Other | Admitting: Anesthesiology

## 2015-01-11 ENCOUNTER — Inpatient Hospital Stay (HOSPITAL_COMMUNITY): Payer: Medicare Other

## 2015-01-11 ENCOUNTER — Inpatient Hospital Stay (HOSPITAL_COMMUNITY)
Admission: EM | Admit: 2015-01-11 | Discharge: 2015-01-17 | DRG: 329 | Disposition: A | Discharge status: Skilled Nursing Facility | Payer: Medicare Other | Source: Other Acute Inpatient Hospital | Attending: Internal Medicine | Admitting: Internal Medicine

## 2015-01-11 ENCOUNTER — Encounter (HOSPITAL_COMMUNITY): Payer: Self-pay | Admitting: General Surgery

## 2015-01-11 ENCOUNTER — Encounter (HOSPITAL_COMMUNITY)
Admission: EM | Disposition: A | Discharge status: Skilled Nursing Facility | Payer: Self-pay | Source: Other Acute Inpatient Hospital | Attending: Family Medicine

## 2015-01-11 DIAGNOSIS — J9601 Acute respiratory failure with hypoxia: Secondary | ICD-10-CM | POA: Diagnosis present

## 2015-01-11 DIAGNOSIS — N179 Acute kidney failure, unspecified: Secondary | ICD-10-CM

## 2015-01-11 DIAGNOSIS — Z79899 Other long term (current) drug therapy: Secondary | ICD-10-CM

## 2015-01-11 DIAGNOSIS — Z87891 Personal history of nicotine dependence: Secondary | ICD-10-CM | POA: Diagnosis not present

## 2015-01-11 DIAGNOSIS — Z9981 Dependence on supplemental oxygen: Secondary | ICD-10-CM

## 2015-01-11 DIAGNOSIS — Z9911 Dependence on respirator [ventilator] status: Secondary | ICD-10-CM

## 2015-01-11 DIAGNOSIS — D509 Iron deficiency anemia, unspecified: Secondary | ICD-10-CM | POA: Diagnosis present

## 2015-01-11 DIAGNOSIS — J95821 Acute postprocedural respiratory failure: Secondary | ICD-10-CM | POA: Diagnosis not present

## 2015-01-11 DIAGNOSIS — K5669 Other intestinal obstruction: Secondary | ICD-10-CM | POA: Diagnosis not present

## 2015-01-11 DIAGNOSIS — I129 Hypertensive chronic kidney disease with stage 1 through stage 4 chronic kidney disease, or unspecified chronic kidney disease: Secondary | ICD-10-CM | POA: Diagnosis present

## 2015-01-11 DIAGNOSIS — J449 Chronic obstructive pulmonary disease, unspecified: Secondary | ICD-10-CM | POA: Diagnosis present

## 2015-01-11 DIAGNOSIS — Z7901 Long term (current) use of anticoagulants: Secondary | ICD-10-CM | POA: Diagnosis not present

## 2015-01-11 DIAGNOSIS — E861 Hypovolemia: Secondary | ICD-10-CM | POA: Diagnosis present

## 2015-01-11 DIAGNOSIS — Z86711 Personal history of pulmonary embolism: Secondary | ICD-10-CM | POA: Diagnosis not present

## 2015-01-11 DIAGNOSIS — Z96652 Presence of left artificial knee joint: Secondary | ICD-10-CM | POA: Diagnosis present

## 2015-01-11 DIAGNOSIS — G8918 Other acute postprocedural pain: Secondary | ICD-10-CM | POA: Diagnosis not present

## 2015-01-11 DIAGNOSIS — K219 Gastro-esophageal reflux disease without esophagitis: Secondary | ICD-10-CM | POA: Diagnosis present

## 2015-01-11 DIAGNOSIS — Z79891 Long term (current) use of opiate analgesic: Secondary | ICD-10-CM | POA: Diagnosis not present

## 2015-01-11 DIAGNOSIS — I493 Ventricular premature depolarization: Secondary | ICD-10-CM | POA: Diagnosis present

## 2015-01-11 DIAGNOSIS — K431 Incisional hernia with gangrene: Principal | ICD-10-CM | POA: Diagnosis present

## 2015-01-11 DIAGNOSIS — K5900 Constipation, unspecified: Secondary | ICD-10-CM | POA: Diagnosis present

## 2015-01-11 DIAGNOSIS — I482 Chronic atrial fibrillation: Secondary | ICD-10-CM | POA: Diagnosis present

## 2015-01-11 DIAGNOSIS — K659 Peritonitis, unspecified: Secondary | ICD-10-CM | POA: Diagnosis present

## 2015-01-11 DIAGNOSIS — K550 Acute vascular disorders of intestine: Secondary | ICD-10-CM | POA: Diagnosis present

## 2015-01-11 DIAGNOSIS — I4891 Unspecified atrial fibrillation: Secondary | ICD-10-CM | POA: Diagnosis present

## 2015-01-11 DIAGNOSIS — J9621 Acute and chronic respiratory failure with hypoxia: Secondary | ICD-10-CM | POA: Diagnosis not present

## 2015-01-11 DIAGNOSIS — J441 Chronic obstructive pulmonary disease with (acute) exacerbation: Secondary | ICD-10-CM | POA: Diagnosis present

## 2015-01-11 DIAGNOSIS — I9581 Postprocedural hypotension: Secondary | ICD-10-CM | POA: Diagnosis not present

## 2015-01-11 DIAGNOSIS — R739 Hyperglycemia, unspecified: Secondary | ICD-10-CM | POA: Diagnosis present

## 2015-01-11 DIAGNOSIS — N189 Chronic kidney disease, unspecified: Secondary | ICD-10-CM | POA: Diagnosis not present

## 2015-01-11 DIAGNOSIS — R579 Shock, unspecified: Secondary | ICD-10-CM | POA: Diagnosis not present

## 2015-01-11 DIAGNOSIS — G4733 Obstructive sleep apnea (adult) (pediatric): Secondary | ICD-10-CM | POA: Diagnosis present

## 2015-01-11 DIAGNOSIS — G4730 Sleep apnea, unspecified: Secondary | ICD-10-CM | POA: Diagnosis present

## 2015-01-11 DIAGNOSIS — I2699 Other pulmonary embolism without acute cor pulmonale: Secondary | ICD-10-CM | POA: Diagnosis present

## 2015-01-11 DIAGNOSIS — F329 Major depressive disorder, single episode, unspecified: Secondary | ICD-10-CM | POA: Diagnosis present

## 2015-01-11 DIAGNOSIS — J45909 Unspecified asthma, uncomplicated: Secondary | ICD-10-CM | POA: Diagnosis present

## 2015-01-11 DIAGNOSIS — M1990 Unspecified osteoarthritis, unspecified site: Secondary | ICD-10-CM | POA: Diagnosis present

## 2015-01-11 DIAGNOSIS — Z978 Presence of other specified devices: Secondary | ICD-10-CM

## 2015-01-11 DIAGNOSIS — Z888 Allergy status to other drugs, medicaments and biological substances status: Secondary | ICD-10-CM

## 2015-01-11 DIAGNOSIS — D696 Thrombocytopenia, unspecified: Secondary | ICD-10-CM | POA: Diagnosis present

## 2015-01-11 DIAGNOSIS — Z4659 Encounter for fitting and adjustment of other gastrointestinal appliance and device: Secondary | ICD-10-CM

## 2015-01-11 DIAGNOSIS — J9811 Atelectasis: Secondary | ICD-10-CM | POA: Diagnosis present

## 2015-01-11 DIAGNOSIS — R0602 Shortness of breath: Secondary | ICD-10-CM

## 2015-01-11 DIAGNOSIS — Z85828 Personal history of other malignant neoplasm of skin: Secondary | ICD-10-CM | POA: Diagnosis not present

## 2015-01-11 DIAGNOSIS — K460 Unspecified abdominal hernia with obstruction, without gangrene: Secondary | ICD-10-CM

## 2015-01-11 DIAGNOSIS — N170 Acute kidney failure with tubular necrosis: Secondary | ICD-10-CM | POA: Diagnosis present

## 2015-01-11 DIAGNOSIS — I480 Paroxysmal atrial fibrillation: Secondary | ICD-10-CM | POA: Diagnosis present

## 2015-01-11 DIAGNOSIS — J9690 Respiratory failure, unspecified, unspecified whether with hypoxia or hypercapnia: Secondary | ICD-10-CM

## 2015-01-11 DIAGNOSIS — Z7951 Long term (current) use of inhaled steroids: Secondary | ICD-10-CM | POA: Diagnosis not present

## 2015-01-11 DIAGNOSIS — Z9889 Other specified postprocedural states: Secondary | ICD-10-CM | POA: Diagnosis not present

## 2015-01-11 DIAGNOSIS — N183 Chronic kidney disease, stage 3 (moderate): Secondary | ICD-10-CM | POA: Diagnosis present

## 2015-01-11 DIAGNOSIS — I10 Essential (primary) hypertension: Secondary | ICD-10-CM | POA: Diagnosis present

## 2015-01-11 DIAGNOSIS — Z87442 Personal history of urinary calculi: Secondary | ICD-10-CM | POA: Diagnosis not present

## 2015-01-11 DIAGNOSIS — Z9289 Personal history of other medical treatment: Secondary | ICD-10-CM

## 2015-01-11 HISTORY — PX: VENTRAL HERNIA REPAIR: SHX424

## 2015-01-11 LAB — BLOOD GAS, ARTERIAL
Acid-Base Excess: 4.1 mmol/L — ABNORMAL HIGH (ref 0.0–2.0)
Acid-base deficit: 1.8 mmol/L (ref 0.0–2.0)
Bicarbonate: 29.2 meq/L — ABNORMAL HIGH (ref 20.0–24.0)
Bicarbonate: 23.8 mEq/L (ref 20.0–24.0)
Drawn by: 232811
Drawn by: 276051
FIO2: 0.8 %
MECHVT: 620 mL
O2 Content: 2 L/min
O2 Saturation: 85.1 %
O2 Saturation: 88.8 %
PEEP/CPAP: 5 cmH2O
Patient temperature: 98.6
Patient temperature: 98.6
RATE: 18 resp/min
TCO2: 25.5 mmol/L (ref 0–100)
TCO2: 22 mmol/L (ref 0–100)
pCO2 arterial: 47.6 mmHg — ABNORMAL HIGH (ref 35.0–45.0)
pCO2 arterial: 46.9 mmHg — ABNORMAL HIGH (ref 35.0–45.0)
pH, Arterial: 7.406 (ref 7.350–7.450)
pH, Arterial: 7.327 — ABNORMAL LOW (ref 7.350–7.450)
pO2, Arterial: 54.2 mmHg — ABNORMAL LOW (ref 80.0–100.0)
pO2, Arterial: 64.9 mmHg — ABNORMAL LOW (ref 80.0–100.0)

## 2015-01-11 LAB — CBC
HEMATOCRIT: 45.1 % (ref 39.0–52.0)
HEMATOCRIT: 37.8 % — AB (ref 39.0–52.0)
HEMOGLOBIN: 14.6 g/dL (ref 13.0–17.0)
Hemoglobin: 12 g/dL — ABNORMAL LOW (ref 13.0–17.0)
MCH: 28.6 pg (ref 26.0–34.0)
MCH: 28.6 pg (ref 26.0–34.0)
MCHC: 32.4 g/dL (ref 30.0–36.0)
MCHC: 31.7 g/dL (ref 30.0–36.0)
MCV: 88.3 fL (ref 78.0–100.0)
MCV: 90 fL (ref 78.0–100.0)
Platelets: 208 10*3/uL (ref 150–400)
Platelets: 136 10*3/uL — ABNORMAL LOW (ref 150–400)
RBC: 5.11 MIL/uL (ref 4.22–5.81)
RBC: 4.2 MIL/uL — ABNORMAL LOW (ref 4.22–5.81)
RDW: 14.8 % (ref 11.5–15.5)
RDW: 15 % (ref 11.5–15.5)
WBC: 16.2 10*3/uL — ABNORMAL HIGH (ref 4.0–10.5)
WBC: 3.6 10*3/uL — AB (ref 4.0–10.5)

## 2015-01-11 LAB — BASIC METABOLIC PANEL
Anion gap: 10 (ref 5–15)
BUN: 31 mg/dL — AB (ref 6–20)
CALCIUM: 7.3 mg/dL — AB (ref 8.9–10.3)
CO2: 25 mmol/L (ref 22–32)
CREATININE: 2.94 mg/dL — AB (ref 0.61–1.24)
Chloride: 105 mmol/L (ref 101–111)
GFR, EST AFRICAN AMERICAN: 22 mL/min — AB (ref >60)
GFR, EST NON AFRICAN AMERICAN: 19 mL/min — AB (ref >60)
GLUCOSE: 129 mg/dL — AB (ref 65–99)
Potassium: 4 mmol/L (ref 3.5–5.1)
SODIUM: 140 mmol/L (ref 135–145)

## 2015-01-11 LAB — CBC WITH DIFFERENTIAL/PLATELET
Basophils Absolute: 0 10*3/uL (ref 0.0–0.1)
Basophils Relative: 0 % (ref 0–1)
Eosinophils Absolute: 0 10*3/uL (ref 0.0–0.7)
Eosinophils Relative: 0 % (ref 0–5)
HCT: 43.4 % (ref 39.0–52.0)
Hemoglobin: 14.4 g/dL (ref 13.0–17.0)
Lymphocytes Relative: 5 % — ABNORMAL LOW (ref 12–46)
Lymphs Abs: 0.7 10*3/uL (ref 0.7–4.0)
MCH: 29.1 pg (ref 26.0–34.0)
MCHC: 33.2 g/dL (ref 30.0–36.0)
MCV: 87.7 fL (ref 78.0–100.0)
MONO ABS: 1.2 10*3/uL — AB (ref 0.1–1.0)
MONOS PCT: 8 % (ref 3–12)
NEUTROS PCT: 87 % — AB (ref 43–77)
Neutro Abs: 12.6 10*3/uL — ABNORMAL HIGH (ref 1.7–7.7)
Platelets: 194 10*3/uL (ref 150–400)
RBC: 4.95 MIL/uL (ref 4.22–5.81)
RDW: 14.8 % (ref 11.5–15.5)
WBC: 14.5 10*3/uL — AB (ref 4.0–10.5)

## 2015-01-11 LAB — COMPREHENSIVE METABOLIC PANEL WITH GFR
ALT: 11 U/L — ABNORMAL LOW (ref 17–63)
AST: 17 U/L (ref 15–41)
Albumin: 4.1 g/dL (ref 3.5–5.0)
Alkaline Phosphatase: 63 U/L (ref 38–126)
Anion gap: 12 (ref 5–15)
BUN: 26 mg/dL — ABNORMAL HIGH (ref 6–20)
CO2: 31 mmol/L (ref 22–32)
Calcium: 9.2 mg/dL (ref 8.9–10.3)
Chloride: 96 mmol/L — ABNORMAL LOW (ref 101–111)
Creatinine, Ser: 2.44 mg/dL — ABNORMAL HIGH (ref 0.61–1.24)
GFR calc Af Amer: 28 mL/min — ABNORMAL LOW
GFR calc non Af Amer: 24 mL/min — ABNORMAL LOW
Glucose, Bld: 145 mg/dL — ABNORMAL HIGH (ref 65–99)
Potassium: 4.4 mmol/L (ref 3.5–5.1)
Sodium: 139 mmol/L (ref 135–145)
Total Bilirubin: 1.6 mg/dL — ABNORMAL HIGH (ref 0.3–1.2)
Total Protein: 8.4 g/dL — ABNORMAL HIGH (ref 6.5–8.1)

## 2015-01-11 LAB — PROTIME-INR
INR: 2.14 — ABNORMAL HIGH (ref 0.00–1.49)
INR: 1.53 — ABNORMAL HIGH (ref 0.00–1.49)
INR: 1.63 — ABNORMAL HIGH (ref 0.00–1.49)
Prothrombin Time: 23.7 seconds — ABNORMAL HIGH (ref 11.6–15.2)
Prothrombin Time: 18.5 seconds — ABNORMAL HIGH (ref 11.6–15.2)
Prothrombin Time: 19.4 seconds — ABNORMAL HIGH (ref 11.6–15.2)

## 2015-01-11 LAB — APTT: aPTT: 37 s (ref 24–37)

## 2015-01-11 LAB — TROPONIN I

## 2015-01-11 LAB — URINE MICROSCOPIC-ADD ON

## 2015-01-11 LAB — URINALYSIS, ROUTINE W REFLEX MICROSCOPIC
Glucose, UA: NEGATIVE mg/dL
Ketones, ur: NEGATIVE mg/dL
NITRITE: NEGATIVE
PH: 5 (ref 5.0–8.0)
Protein, ur: 30 mg/dL — AB
Specific Gravity, Urine: 1.023 (ref 1.005–1.030)
Urobilinogen, UA: 0.2 mg/dL (ref 0.0–1.0)

## 2015-01-11 LAB — SURGICAL PCR SCREEN
MRSA, PCR: NEGATIVE
STAPHYLOCOCCUS AUREUS: NEGATIVE

## 2015-01-11 LAB — TYPE AND SCREEN
ABO/RH(D): O NEG
ANTIBODY SCREEN: NEGATIVE

## 2015-01-11 LAB — BRAIN NATRIURETIC PEPTIDE: B NATRIURETIC PEPTIDE 5: 119.7 pg/mL — AB (ref 0.0–100.0)

## 2015-01-11 LAB — LACTIC ACID, PLASMA: LACTIC ACID, VENOUS: 1.7 mmol/L (ref 0.5–2.0)

## 2015-01-11 LAB — PROCALCITONIN: Procalcitonin: 1.09 ng/mL

## 2015-01-11 SURGERY — REPAIR, HERNIA, VENTRAL
Anesthesia: General | Site: Abdomen

## 2015-01-11 MED ORDER — 0.9 % SODIUM CHLORIDE (POUR BTL) OPTIME
TOPICAL | Status: DC | PRN
  Administered 2015-01-11 (×5): 1000 mL

## 2015-01-11 MED ORDER — DEXAMETHASONE SODIUM PHOSPHATE 10 MG/ML IJ SOLN
INTRAMUSCULAR | Status: AC
Start: 2015-01-11 — End: 2015-01-11
  Filled 2015-01-11: qty 1

## 2015-01-11 MED ORDER — ALBUMIN HUMAN 5 % IV SOLN
INTRAVENOUS | Status: AC
  Filled 2015-01-11: qty 250

## 2015-01-11 MED ORDER — DEXTROSE 5 % IV SOLN
0.0000 ug/min | INTRAVENOUS | Status: DC
  Administered 2015-01-12: 100 ug/min via INTRAVENOUS
  Administered 2015-01-12: 125 ug/min via INTRAVENOUS
  Administered 2015-01-12: 75 ug/min via INTRAVENOUS
  Administered 2015-01-12: 100 ug/min via INTRAVENOUS
  Administered 2015-01-12: 200 ug/min via INTRAVENOUS
  Administered 2015-01-12: 150 ug/min via INTRAVENOUS
  Administered 2015-01-13: 125 ug/min via INTRAVENOUS
  Filled 2015-01-11 (×8): qty 4

## 2015-01-11 MED ORDER — BUDESONIDE 0.5 MG/2ML IN SUSP
0.5000 mg | Freq: Two times a day (BID) | RESPIRATORY_TRACT | Status: DC
  Administered 2015-01-11: 0.5 mg via RESPIRATORY_TRACT
  Filled 2015-01-11 (×2): qty 2

## 2015-01-11 MED ORDER — SODIUM CHLORIDE 0.9 % IV SOLN
Freq: Once | INTRAVENOUS | Status: AC
Start: 2015-01-11 — End: 2015-01-11
  Administered 2015-01-11: 13:00:00 via INTRAVENOUS

## 2015-01-11 MED ORDER — VITAMIN K1 10 MG/ML IJ SOLN
5.0000 mg | Freq: Once | INTRAVENOUS | Status: AC
  Administered 2015-01-11: 5 mg via INTRAVENOUS
  Filled 2015-01-11: qty 0.5

## 2015-01-11 MED ORDER — MIDAZOLAM HCL 2 MG/2ML IJ SOLN
INTRAMUSCULAR | Status: AC
  Filled 2015-01-11: qty 2

## 2015-01-11 MED ORDER — IPRATROPIUM-ALBUTEROL 0.5-2.5 (3) MG/3ML IN SOLN
3.0000 mL | Freq: Four times a day (QID) | RESPIRATORY_TRACT | Status: DC
  Administered 2015-01-11 (×2): 3 mL via RESPIRATORY_TRACT
  Filled 2015-01-11 (×2): qty 3

## 2015-01-11 MED ORDER — LIDOCAINE HCL (CARDIAC) 20 MG/ML IV SOLN
INTRAVENOUS | Status: AC
  Filled 2015-01-11: qty 5

## 2015-01-11 MED ORDER — SODIUM CHLORIDE 0.9 % IV BOLUS (SEPSIS)
1000.0000 mL | Freq: Once | INTRAVENOUS | Status: AC
  Administered 2015-01-11: 1000 mL via INTRAVENOUS

## 2015-01-11 MED ORDER — SODIUM CHLORIDE 0.9 % IJ SOLN: 3.0000 mL | Freq: Two times a day (BID) | INTRAMUSCULAR | Status: DC

## 2015-01-11 MED ORDER — FENTANYL CITRATE (PF) 100 MCG/2ML IJ SOLN
50.0000 ug | INTRAMUSCULAR | Status: DC | PRN
  Administered 2015-01-11: 50 ug via INTRAVENOUS
  Filled 2015-01-11: qty 2

## 2015-01-11 MED ORDER — SUFENTANIL CITRATE 50 MCG/ML IV SOLN
INTRAVENOUS | Status: DC | PRN
  Administered 2015-01-11 (×4): 10 ug via INTRAVENOUS

## 2015-01-11 MED ORDER — MIDAZOLAM HCL 2 MG/2ML IJ SOLN
INTRAMUSCULAR | Status: DC | PRN
  Administered 2015-01-11 (×2): 0.5 mg via INTRAVENOUS

## 2015-01-11 MED ORDER — PROPOFOL 10 MG/ML IV BOLUS
INTRAVENOUS | Status: AC
  Filled 2015-01-11: qty 20

## 2015-01-11 MED ORDER — SODIUM CHLORIDE 0.9 % IV BOLUS (SEPSIS): 500.0000 mL | Freq: Once | INTRAVENOUS | Status: DC

## 2015-01-11 MED ORDER — SODIUM CHLORIDE 0.9 % IV SOLN
INTRAVENOUS | Status: DC
  Administered 2015-01-11 (×3): via INTRAVENOUS

## 2015-01-11 MED ORDER — SUFENTANIL CITRATE 50 MCG/ML IV SOLN
INTRAVENOUS | Status: AC
  Filled 2015-01-11: qty 1

## 2015-01-11 MED ORDER — ALBUMIN HUMAN 5 % IV SOLN
INTRAVENOUS | Status: DC | PRN
  Administered 2015-01-11: 14:00:00 via INTRAVENOUS

## 2015-01-11 MED ORDER — PROPOFOL 1000 MG/100ML IV EMUL
5.0000 ug/kg/min | INTRAVENOUS | Status: DC
  Administered 2015-01-11 (×2): 30 ug/kg/min via INTRAVENOUS
  Administered 2015-01-11: 25 ug/kg/min via INTRAVENOUS
  Administered 2015-01-12: 30 ug/kg/min via INTRAVENOUS
  Administered 2015-01-12 (×3): 40 ug/kg/min via INTRAVENOUS
  Filled 2015-01-11 (×4): qty 100
  Filled 2015-01-11: qty 1400
  Filled 2015-01-11: qty 100

## 2015-01-11 MED ORDER — FENTANYL CITRATE (PF) 100 MCG/2ML IJ SOLN: 25.0000 ug | INTRAMUSCULAR | Status: DC | PRN

## 2015-01-11 MED ORDER — PROPOFOL 10 MG/ML IV BOLUS
INTRAVENOUS | Status: DC | PRN
  Administered 2015-01-11: 120 mg via INTRAVENOUS

## 2015-01-11 MED ORDER — BUDESONIDE 0.25 MG/2ML IN SUSP
0.2500 mg | Freq: Four times a day (QID) | RESPIRATORY_TRACT | Status: DC
  Administered 2015-01-11 – 2015-01-13 (×7): 0.25 mg via RESPIRATORY_TRACT
  Filled 2015-01-11 (×7): qty 2

## 2015-01-11 MED ORDER — VITAMIN K1 10 MG/ML IJ SOLN: 5.0000 mg | Freq: Once | INTRAVENOUS | Status: DC

## 2015-01-11 MED ORDER — HYDROMORPHONE HCL 1 MG/ML IJ SOLN: 0.2500 mg | INTRAMUSCULAR | Status: DC | PRN

## 2015-01-11 MED ORDER — SODIUM CHLORIDE 0.9 % IJ SOLN
INTRAMUSCULAR | Status: AC
  Filled 2015-01-11: qty 10

## 2015-01-11 MED ORDER — ONDANSETRON HCL 4 MG/2ML IJ SOLN: 4.0000 mg | Freq: Four times a day (QID) | INTRAMUSCULAR | Status: DC | PRN

## 2015-01-11 MED ORDER — SODIUM CHLORIDE 0.9 % IV SOLN
10.0000 mg | INTRAVENOUS | Status: DC | PRN
  Administered 2015-01-11: 100 ug/min via INTRAVENOUS

## 2015-01-11 MED ORDER — BUDESONIDE 0.5 MG/2ML IN SUSP
0.2500 mg | Freq: Four times a day (QID) | RESPIRATORY_TRACT | Status: DC
  Filled 2015-01-11: qty 2

## 2015-01-11 MED ORDER — PHENYLEPHRINE HCL 10 MG/ML IJ SOLN
INTRAMUSCULAR | Status: AC
  Filled 2015-01-11: qty 1

## 2015-01-11 MED ORDER — SUCCINYLCHOLINE CHLORIDE 20 MG/ML IJ SOLN
INTRAMUSCULAR | Status: DC | PRN
  Administered 2015-01-11: 100 mg via INTRAVENOUS

## 2015-01-11 MED ORDER — PANTOPRAZOLE SODIUM 40 MG IV SOLR
40.0000 mg | Freq: Two times a day (BID) | INTRAVENOUS | Status: DC
  Administered 2015-01-11 – 2015-01-16 (×11): 40 mg via INTRAVENOUS
  Filled 2015-01-11 (×13): qty 40

## 2015-01-11 MED ORDER — PHENYLEPHRINE 40 MCG/ML (10ML) SYRINGE FOR IV PUSH (FOR BLOOD PRESSURE SUPPORT)
PREFILLED_SYRINGE | INTRAVENOUS | Status: AC
  Filled 2015-01-11: qty 10

## 2015-01-11 MED ORDER — ACETAMINOPHEN 650 MG RE SUPP: 650.0000 mg | Freq: Four times a day (QID) | RECTAL | Status: DC | PRN

## 2015-01-11 MED ORDER — EPHEDRINE SULFATE 50 MG/ML IJ SOLN
INTRAMUSCULAR | Status: AC
  Filled 2015-01-11: qty 1

## 2015-01-11 MED ORDER — PHENYLEPHRINE HCL 10 MG/ML IJ SOLN
0.0000 ug/min | INTRAMUSCULAR | Status: DC
  Administered 2015-01-11: 5 ug/min via INTRAVENOUS
  Filled 2015-01-11: qty 1

## 2015-01-11 MED ORDER — ONDANSETRON HCL 4 MG PO TABS: 4.0000 mg | ORAL_TABLET | Freq: Four times a day (QID) | ORAL | Status: DC | PRN

## 2015-01-11 MED ORDER — LACTATED RINGERS IV SOLN
INTRAVENOUS | Status: DC
  Administered 2015-01-11: 19:00:00 via INTRAVENOUS

## 2015-01-11 MED ORDER — VANCOMYCIN HCL 10 G IV SOLR
1750.0000 mg | Freq: Every day | INTRAVENOUS | Status: DC
  Administered 2015-01-11 – 2015-01-13 (×3): 1750 mg via INTRAVENOUS
  Filled 2015-01-11 (×3): qty 1750

## 2015-01-11 MED ORDER — LIDOCAINE HCL (CARDIAC) 20 MG/ML IV SOLN
INTRAVENOUS | Status: DC | PRN
  Administered 2015-01-11: 100 mg via INTRAVENOUS

## 2015-01-11 MED ORDER — CISATRACURIUM BESYLATE (PF) 10 MG/5ML IV SOLN
INTRAVENOUS | Status: DC | PRN
  Administered 2015-01-11: 16 mg via INTRAVENOUS
  Administered 2015-01-11: 4 mg via INTRAVENOUS
  Administered 2015-01-11: 14 mg via INTRAVENOUS
  Administered 2015-01-11: 6 mg via INTRAVENOUS

## 2015-01-11 MED ORDER — IPRATROPIUM-ALBUTEROL 0.5-2.5 (3) MG/3ML IN SOLN
3.0000 mL | Freq: Four times a day (QID) | RESPIRATORY_TRACT | Status: DC
  Administered 2015-01-11 – 2015-01-14 (×11): 3 mL via RESPIRATORY_TRACT
  Filled 2015-01-11 (×11): qty 3

## 2015-01-11 MED ORDER — ACETAMINOPHEN 325 MG PO TABS: 650.0000 mg | ORAL_TABLET | Freq: Four times a day (QID) | ORAL | Status: DC | PRN

## 2015-01-11 MED ORDER — IPRATROPIUM-ALBUTEROL 0.5-2.5 (3) MG/3ML IN SOLN
3.0000 mL | RESPIRATORY_TRACT | Status: DC
  Administered 2015-01-11: 3 mL via RESPIRATORY_TRACT
  Filled 2015-01-11: qty 3

## 2015-01-11 MED ORDER — HYDROMORPHONE HCL 1 MG/ML IJ SOLN: 0.5000 mg | INTRAMUSCULAR | Status: DC | PRN

## 2015-01-11 MED ORDER — PIPERACILLIN-TAZOBACTAM 3.375 G IVPB
3.3750 g | Freq: Three times a day (TID) | INTRAVENOUS | Status: AC
  Administered 2015-01-11 – 2015-01-15 (×15): 3.375 g via INTRAVENOUS
  Filled 2015-01-11 (×16): qty 50

## 2015-01-11 MED ORDER — SODIUM CHLORIDE 0.9 % IV BOLUS (SEPSIS)
250.0000 mL | Freq: Once | INTRAVENOUS | Status: AC
  Administered 2015-01-11: 250 mL via INTRAVENOUS

## 2015-01-11 MED ORDER — CISATRACURIUM BESYLATE 20 MG/10ML IV SOLN
INTRAVENOUS | Status: AC
  Filled 2015-01-11: qty 10

## 2015-01-11 MED ORDER — SODIUM CHLORIDE 0.9 % IV SOLN: 30.0000 ug/min | INTRAVENOUS | Status: DC

## 2015-01-11 SURGICAL SUPPLY — 43 items
BINDER ABDOMINAL 12 ML 46-62 (SOFTGOODS) IMPLANT
BLADE HEX COATED 2.75 (ELECTRODE) ×3 IMPLANT
COVER MAYO STAND STRL (DRAPES) ×3 IMPLANT
DRAIN CHANNEL RND F F (WOUND CARE) IMPLANT
DRAPE LAPAROSCOPIC ABDOMINAL (DRAPES) ×3 IMPLANT
DRAPE POUCH INSTRU U-SHP 10X18 (DRAPES) IMPLANT
DRSG TEGADERM 6X8 (GAUZE/BANDAGES/DRESSINGS) ×12 IMPLANT
DRSG VAC ATS MED SENSATRAC (GAUZE/BANDAGES/DRESSINGS) ×3 IMPLANT
ELECT REM PT RETURN 9FT ADLT (ELECTROSURGICAL) ×3
ELECTRODE REM PT RTRN 9FT ADLT (ELECTROSURGICAL) ×1 IMPLANT
EVACUATOR SILICONE 100CC (DRAIN) IMPLANT
GAUZE SPONGE 4X4 12PLY STRL (GAUZE/BANDAGES/DRESSINGS) IMPLANT
GLOVE BIOGEL PI IND STRL 7.0 (GLOVE) ×3 IMPLANT
GLOVE BIOGEL PI INDICATOR 7.0 (GLOVE) ×6
GLOVE EUDERMIC 7 POWDERFREE (GLOVE) ×3 IMPLANT
GOWN STRL REUS W/TWL LRG LVL3 (GOWN DISPOSABLE) ×6 IMPLANT
GOWN STRL REUS W/TWL XL LVL3 (GOWN DISPOSABLE) ×6 IMPLANT
KIT BASIN OR (CUSTOM PROCEDURE TRAY) ×3 IMPLANT
LIGASURE IMPACT 36 18CM CVD LR (INSTRUMENTS) ×3 IMPLANT
NS IRRIG 1000ML POUR BTL (IV SOLUTION) ×3 IMPLANT
PACK GENERAL/GYN (CUSTOM PROCEDURE TRAY) ×3 IMPLANT
RELOAD PROXIMATE 75MM BLUE (ENDOMECHANICALS) ×15 IMPLANT
RELOAD PROXIMATE TA60MM BLUE (ENDOMECHANICALS) ×3 IMPLANT
SPONGE LAP 18X18 X RAY DECT (DISPOSABLE) ×9 IMPLANT
STAPLER GUN LINEAR PROX 60 (STAPLE) ×3 IMPLANT
STAPLER PROXIMATE 75MM BLUE (STAPLE) ×3 IMPLANT
STAPLER VISISTAT 35W (STAPLE) IMPLANT
SUT ETHILON 3 0 PS 1 (SUTURE) IMPLANT
SUT NOVA 1 T20/GS 25DT (SUTURE) ×9 IMPLANT
SUT NOVA NAB DX-16 0-1 5-0 T12 (SUTURE) IMPLANT
SUT PDS AB 1 CTX 36 (SUTURE) IMPLANT
SUT PROLENE 0 CT 1 CR/8 (SUTURE) IMPLANT
SUT SILK 2 0 (SUTURE) ×2
SUT SILK 2 0 30  PSL (SUTURE)
SUT SILK 2 0 30 PSL (SUTURE) IMPLANT
SUT SILK 2 0 SH CR/8 (SUTURE) ×6 IMPLANT
SUT SILK 2-0 18XBRD TIE 12 (SUTURE) ×1 IMPLANT
SUT SILK 3 0 SH CR/8 (SUTURE) ×6 IMPLANT
SUT VIC AB 2-0 CT1 27 (SUTURE)
SUT VIC AB 2-0 CT1 27XBRD (SUTURE) IMPLANT
SUT VIC AB 2-0 CTX 36 (SUTURE) IMPLANT
TOWEL OR 17X26 10 PK STRL BLUE (TOWEL DISPOSABLE) ×6 IMPLANT
TRAY FOLEY W/METER SILVER 14FR (SET/KITS/TRAYS/PACK) IMPLANT

## 2015-01-11 NOTE — Progress Notes (Signed)
Patient admitted from 5th floor with NG in place in right nare but was not connected to suction and with question regarding placement. Radiologist called regarding patient's films and stated the NG needed to be pulled and a new one reinserted.  After multiple attempts of reinsertion, NG was successfully reinserted in the right nare with verification done by 2 RNs with auscultation.  Patient was then connected to LIWS where brown drainage was present.  Patient received 2nd IV in place to allow for FFP and fluid bolus to be given due low BP (surgical PA made aware of BP). Will continue to monitor patient.

## 2015-01-11 NOTE — Progress Notes (Addendum)
ANTIBIOTIC CONSULT NOTE - INITIAL  Pharmacy Consult for zosyn/vancomycin Indication: intraabdominal infection/sepsis  Allergies  Allergen Reactions  . Ciprofloxacin Diarrhea  . Morphine And Related     HALLUCINATIONS  . Zithromax [Azithromycin] Diarrhea    Patient Measurements: Height: 6' (182.9 cm) Weight: 280 lb 3.3 oz (127.1 kg) IBW/kg (Calculated) : 77.6 Adjusted Body Weight:   Vital Signs: Temp: 98.7 F (37.1 C) (07/08 0121) Temp Source: Oral (07/08 0121) BP: 124/71 mmHg (07/08 0121) Pulse Rate: 74 (07/08 0121) Intake/Output from previous day:   Intake/Output from this shift:    Labs:  Recent Labs  01/11/15 0325  WBC 14.5*  HGB 14.4  PLT 194  CREATININE 2.44*   Estimated Creatinine Clearance: 34.9 mL/min (by C-G formula based on Cr of 2.44). No results for input(s): VANCOTROUGH, VANCOPEAK, VANCORANDOM, GENTTROUGH, GENTPEAK, GENTRANDOM, TOBRATROUGH, TOBRAPEAK, TOBRARND, AMIKACINPEAK, AMIKACINTROU, AMIKACIN in the last 72 hours.   Microbiology: No results found for this or any previous visit (from the past 720 hour(s)).  Medical History: Past Medical History  Diagnosis Date  . Dysrhythmia     PAST HX OF HEART ABLATION FOR IRREGULAR HEART RATE --SUCCESSFUL-NO FURTHER PROB WITH IRREGULAR HEART BEAT  . COPD (chronic obstructive pulmonary disease)     HX OF SMOKING FOR 60 YRS-PT SEES DR. Winona Legato IN [place]   . Shortness of breath     WITH ANY EXERTION-USES OXYGEN AS NEEDED  . Pneumonia     IN THE PAST - BUT NOT IN THE LAST YR  . Asthma   . Pulmonary embolus 2011 OR 2012    CHRONIC WARFARIN SINCE THE BLOOD CLOT  . Bilateral leg edema     WEARS SUPPORT HOSE  . History of kidney stones     SOME SMALL STONES AT PRESENT TIME- TAKES POTASSIUM CITRATE  . H/O hiatal hernia   . GERD (gastroesophageal reflux disease)   . Cancer     SKIN CANCERS  . Arthritis     ALL OVER  . Sleep apnea     USES BIPAP  SETTING 16 / 11 - PER PULMONOGIST'S OFFICE NOTE     Medications:  Anti-infectives    Start     Dose/Rate Route Frequency Ordered Stop   01/11/15 0330  piperacillin-tazobactam (ZOSYN) IVPB 3.375 g     3.375 g 12.5 mL/hr over 240 Minutes Intravenous 3 times per day 01/11/15 0321       Assessment: Patient with intra-abdominal infection.  First dose of antibiotics already given in ED.  Patient with reduced renal function  Goal of Therapy:  Zosyn based on renal function  Vancomycin level 15-20  Plan: Zosyn 3.375g IV Q8H infused over 4hrs. Vancomycin 1750mg  iv q24hr   Nani Skillern Crowford 01/11/2015,5:21 AM

## 2015-01-11 NOTE — Anesthesia Procedure Notes (Signed)
Procedure Name: Intubation Date/Time: 01/11/2015 1:14 PM Performed by: Danley Danker L Patient Re-evaluated:Patient Re-evaluated prior to inductionOxygen Delivery Method: Circle system utilized Preoxygenation: Pre-oxygenation with 100% oxygen Intubation Type: IV induction, Rapid sequence and Cricoid Pressure applied Laryngoscope Size: Miller and 3 Grade View: Grade I Tube type: Subglottic suction tube Tube size: 8.0 mm Number of attempts: 1 Airway Equipment and Method: Stylet Placement Confirmation: ETT inserted through vocal cords under direct vision,  breath sounds checked- equal and bilateral and positive ETCO2 Secured at: 21 cm Tube secured with: Tape Dental Injury: Teeth and Oropharynx as per pre-operative assessment

## 2015-01-11 NOTE — Anesthesia Preprocedure Evaluation (Addendum)
Anesthesia Evaluation  Patient identified by MRN, date of birth, ID band Patient awake  General Assessment Comment:Past Medical History Diagnosis Date . Dysrhythmia    PAST HX OF HEART ABLATION FOR IRREGULAR HEART RATE --SUCCESSFUL-NO FURTHER PROB WITH IRREGULAR HEART BEAT . COPD (chronic obstructive pulmonary disease)    HX OF SMOKING FOR 60 YRS-PT SEES DR. Winona Legato IN [place]  . Shortness of breath    WITH ANY EXERTION-USES OXYGEN AS NEEDED . Pneumonia    IN THE PAST - BUT NOT IN THE LAST YR . Asthma  . Pulmonary embolus 2011 OR 2012   CHRONIC WARFARIN SINCE THE BLOOD CLOT . Bilateral leg edema    WEARS SUPPORT HOSE . History of kidney stones    SOME SMALL STONES AT PRESENT TIME- TAKES POTASSIUM CITRATE . H/O hiatal hernia  . GERD (gastroesophageal reflux disease)  . Cancer    SKIN CANCERS . Arthritis    ALL OVER . Sleep apnea    USES BIPAP SETTING 16 / 11 - PER PULMONOGIST'S OFFICE NOTE       Reviewed: Allergy & Precautions, NPO status , Patient's Chart, lab work & pertinent test results  Airway Mallampati: II  TM Distance: >3 FB Neck ROM: Full    Dental no notable dental hx.    Pulmonary shortness of breath, asthma , sleep apnea, Continuous Positive Airway Pressure Ventilation and Oxygen sleep apnea , pneumonia -, resolved, COPD COPD inhaler and oxygen dependent, former smoker,  breath sounds clear to auscultation  Pulmonary exam normal       Cardiovascular Exercise Tolerance: Poor hypertension, Pt. on medications Normal cardiovascular exam+ dysrhythmias Rhythm:Regular Rate:Normal     Neuro/Psych negative neurological ROS  negative psych ROS   GI/Hepatic Neg liver ROS, hiatal hernia, GERD-  Medicated,  Endo/Other  Morbid obesity  Renal/GU Renal InsufficiencyRenal diseaseCr 1.44  negative genitourinary   Musculoskeletal  (+)  Arthritis -,   Abdominal (+) + obese,   Peds negative pediatric ROS (+)  Hematology  (+) anemia ,   Anesthesia Other Findings   Reproductive/Obstetrics negative OB ROS                          Anesthesia Physical Anesthesia Plan  ASA: IV and emergent  Anesthesia Plan: General   Post-op Pain Management:    Induction: Intravenous  Airway Management Planned: Oral ETT  Additional Equipment:   Intra-op Plan:   Post-operative Plan: Post-operative intubation/ventilation  Informed Consent: I have reviewed the patients History and Physical, chart, labs and discussed the procedure including the risks, benefits and alternatives for the proposed anesthesia with the patient or authorized representative who has indicated his/her understanding and acceptance.   Dental advisory given  Plan Discussed with: CRNA  Anesthesia Plan Comments: (Plan postoperative ventilation. Chronic respiratory failure on home oxygen, morbid obesity suggest increased pulmonary risk.)       Anesthesia Quick Evaluation

## 2015-01-11 NOTE — Op Note (Signed)
Patient Name:           Gary Boyer   Date of Surgery:        01/11/2015  Pre op Diagnosis:      Incarcerated incisional hernia with small bowel obstruction  Post op Diagnosis:    Strangulated incisional hernia with small bowel obstruction, infarcted small bowel without perforation, infarcted omentum  Procedure:                 Exploratory laparotomy,  small bowel resection 2,  subtotal omentectomy,  repair strangulated incisional hernia,  application of negative negative pressure dressing  Surgeon:                     Edsel Petrin. Dalbert Batman, M.D., FACS  Assistant:                      Kalman Shan, [place]  Operative Indications:   This is a 79 year old Caucasian man who was transferred from [facility] in [place] from the emergency department for incarcerated possibly strangulate ventral hernia with small bowel obstruction.  Significant past medical history of COPD on home oxygen, atrial fibrillation and pulmonary embolus on Coumadin, chronic kidney disease, and cirrhosis.  He had resection of a benign gastric tumor in [place] many years ago.  He's noted that he had a ventral hernia for many years says that is usually been reducible.  He now has a three-day history of nausea vomiting and pain.  Examination this morning reveals a tender mass the size of a large grapefruit to the right of the lower end of his upper midline incision.  Skin looks okay but very tender.  Rest of the abdomen is fairly soft.  WBC 16,000.  Creatinine 2.44.  CT scan shows loops of small bowel incarcerated in his hernia with a transition point and bowel obstruction.  Lactic acid normal.  He is chronically ill and quite deconditioned.  He has been given vitamin K and 3 units of fresh frozen plasma rapidly this morning bring his INR down to 1.52.  He is brought to the operating room emergently.  Operative Findings:       There was a segment of small bowel that was infarcted but had not perforated.  This was probably  18-20 inches in length.  After resecting this and performing an anastomosis there was a leak at the staple line not chose to resect the anastomosis and created an entirely new anastomosis, being concerned about the integrity of the bowel.  The majority of the omentum was caught in the hernia and was necrotic and had to be resected.  The rest of the small bowel and transverse colon looked normal.  At the end of the case he seemed to be bleeding a little bit from most of the needle punctures and cut surfaces and so we plan to give further fresh frozen plasma.  Dr. Eliseo Squires of the anesthesia department did not think he could be extubated and he was taken to the ICU on the ventilator.  Procedure in Detail:          Following the induction of general endotracheal anesthesia the patient's abdomen was prepped and draped in sterile fashion.  He had just finished receiving a dose of IV Zosyn.  Surgical timeout was performed.  Midline incision was made both above and below the palpable mass.  Dissection was carried down through subcutaneous tissue.  We dissected the large hernia sac away from the  subcutaneous tissue all the way down to the fascia.  I entered the hernia sac and noted that there was a foul odor but no purulence.  The omentum was necrotic.  I slowly dissected the hernia sac away from the rim of the fascia until I had circumferential freedom in all directions.  I lifted up the omentum and transverse colon and resected the omentum with the LigaSure device.  I lifted up the small bowel and found the ischemic segment.  I opened the fascia superiorly and inferiorly to gain better exposure.  The fascia above and below this incision now felt intact.  This small bowel segment was resected using a GIA stapling device proximally and distally.  The mesentery was divided with the LigaSure device.  Anastomosis was created with a GIA stapling device and the common defect was closed with a TA 60 stapling device.  When I  inspected the anastomosis there was a leak at one of the connections.  I was concerned small bowel was not viable and chose not to repair this primarily and chose  simply to resect this.    I resected the small bowel conservatively proximal and distal to the anastomotic segment using a GIA stapling device.  I conservatively divided the mesentery again with the LigaSure device.  After passing both small bowel segments off I created a new anastomosis in a similar fashion using a GIA stapling device and a TA 60.  Silk sutures were used to reinforce the staple line at critical places.  This time the anastomosis looked good and had good integrity with a squeeze test.  The mesentery was closed with silk sutures.  We then returned the transverse colon and small bowel to the abdominal cavity.  We examined the small bowel with proximal and distal and found no other abnormalities.  We irrigated the abdomen and pelvis at with about 5 L of saline.  The midline fascia was closed with interrupted sutures of #1 Novafil.  Negative pressure  vacuum dressing was placed using black sponge.  This had a good seal.  The patient tolerated the procedure reasonably well was taken to ICU in stable but critical  condition.     Estimated blood loss 200 mL.  He received 250 mL of 5% albumen and 3000 mL of crystalloid.  Counts correct.  Complications none.     Edsel Petrin. Dalbert Batman, M.D., FACS General and Minimally Invasive Surgery Breast and Colorectal Surgery  01/11/2015 3:20 PM

## 2015-01-11 NOTE — Progress Notes (Signed)
Patient seen and evaluated earlier the same by my associate. Please refer to H&P for details regarding assessment and plan. General surgery on board and plans are for operation this afternoon  Gen.: Patient laying supine with NG tube in place in no acute distress Cardiovascular: No cyanosis Pulmonary: No increased work of breathing, no audible wheezes  Patient's blood pressure soft as such will order fluid boluses. Urine has been concentrated as such I suspect patient may be dry.  We'll reassess next a.m. unless acute medical issue arises.  Leeba Barbe, [employer]

## 2015-01-11 NOTE — H&P (Addendum)
[facility] History and Physical  Patient: Gary Boyer  MRN: [id number]  DOB: February 06, 1936  DOS: the patient was seen and examined on 01/11/2015 PCP: No primary care provider on file.  Referring physician: [facility] Chief Complaint: Diarrhea, constipation, abdominal pain  HPI: Gary Boyer is a 79 y.o. male with Past medical history of COPD on chronic 2 L of oxygen, history of pulmonary embolism on chronic warfarin, chronic A. fib status post ablation, GERD, chronic kidney disease, sleep apnea. The patient is presenting with complaint of progressively worsening shortness of breath and cough as well as abdominal pain and diarrhea ongoing since last 3-4 days. Patient mentions that his primary complaint is constipation which is present for last 4 days. He had 2 bowel movements 1 day before yesterday and one on 01/10/2015. He mentions they were really small than the day his usual. He started having abdominal pain for 3 days as well which was located on his lower abdominal part and feels like sharp crampy abdominal pain and also around his umbilicus where he had a hernia. He denies any chest pain but complains of progressively worsening shortness of breath as well he has chronic cough which has not changed. He denies any fever but complains of chills. He denies any new leg swelling. He denies any change in his medications. He denies any recent procedures or hospitalizations. He had an surgery on his abdomen most likely laparotomy for ulcer. Pt creatinine was 1.9 in other facility  The patient is coming from home.  At his baseline ambulates without any support And is independent for most of his ADL manages her medication on his own.  Review of Systems: as mentioned in the history of present illness.  A comprehensive review of the other systems is negative.  Past Medical History  Diagnosis Date  . Dysrhythmia     PAST HX OF HEART ABLATION FOR IRREGULAR HEART RATE  --SUCCESSFUL-NO FURTHER PROB WITH IRREGULAR HEART BEAT  . COPD (chronic obstructive pulmonary disease)     HX OF SMOKING FOR 60 YRS-PT SEES DR. Winona Legato IN [place]   . Shortness of breath     WITH ANY EXERTION-USES OXYGEN AS NEEDED  . Pneumonia     IN THE PAST - BUT NOT IN THE LAST YR  . Asthma   . Pulmonary embolus 2011 OR 2012    CHRONIC WARFARIN SINCE THE BLOOD CLOT  . Bilateral leg edema     WEARS SUPPORT HOSE  . History of kidney stones     SOME SMALL STONES AT PRESENT TIME- TAKES POTASSIUM CITRATE  . H/O hiatal hernia   . GERD (gastroesophageal reflux disease)   . Cancer     SKIN CANCERS  . Arthritis     ALL OVER  . Sleep apnea     USES BIPAP  SETTING 16 / 11 - PER PULMONOGIST'S OFFICE NOTE   Past Surgical History  Procedure Laterality Date  . Mass removed from stomach - benign  ABOUT 2008 ?  Marland Kitchen Heart ablation    . Multiple tooth extractions    . Tonsillectomy      T & A - AS A CHILD  . Total knee arthroplasty Left 11/08/2012    Procedure: LEFT TOTAL KNEE ARTHROPLASTY;  Surgeon: Mauri Pole, MD;  Location: [facility] ORS;  Service: Orthopedics;  Laterality: Left;   Social History:  reports that he has quit smoking. His smoking use included Cigarettes. He has a 120 pack-year smoking history. He has never used smokeless  tobacco. He reports that he does not drink alcohol or use illicit drugs.  Allergies  Allergen Reactions  . Ciprofloxacin Diarrhea  . Morphine And Related     HALLUCINATIONS  . Zithromax [Azithromycin] Diarrhea    History reviewed. No pertinent family history.  Prior to Admission medications   Medication Sig Start Date End Date Taking? Authorizing Provider  albuterol (PROVENTIL) (2.5 MG/3ML) 0.083% nebulizer solution Take 2.5 mg by nebulization every 6 (six) hours as needed for wheezing.   Yes Historical Provider, MD  amLODipine (NORVASC) 10 MG tablet Take 10 mg by mouth every morning.   Yes Historical Provider, MD  arformoterol (BROVANA) 15 MCG/2ML NEBU  Take 15 mcg by nebulization 2 (two) times daily.   Yes Historical Provider, MD  budesonide (PULMICORT) 0.5 MG/2ML nebulizer solution Take 0.5 mg by nebulization 2 (two) times daily.   Yes Historical Provider, MD  cholecalciferol (VITAMIN D) 1000 UNITS tablet Take 1,000 Units by mouth daily.   Yes Historical Provider, MD  enalapril (VASOTEC) 10 MG tablet Take 10 mg by mouth 2 (two) times daily.   Yes Historical Provider, MD  furosemide (LASIX) 40 MG tablet Take 40 mg by mouth daily.   Yes Historical Provider, MD  ipratropium (ATROVENT) 0.02 % nebulizer solution Take 500 mcg by nebulization 4 (four) times daily.   Yes Historical Provider, MD  polyethylene glycol (MIRALAX / GLYCOLAX) packet Take 17 g by mouth daily. Patient taking differently: Take 17 g by mouth once.  11/11/12  Yes Danae Orleans, PA-C  Potassium Citrate 15 MEQ (1620 MG) TBCR Take 1 tablet by mouth 2 (two) times daily.   Yes Historical Provider, MD  ranitidine (ZANTAC) 150 MG tablet Take 150 mg by mouth 2 (two) times daily.   Yes Historical Provider, MD  vitamin B-12 (CYANOCOBALAMIN) 1000 MCG tablet Take 1,000 mcg by mouth daily.   Yes Historical Provider, MD  warfarin (COUMADIN) 1 MG tablet Take 1 mg by mouth daily.   Yes Historical Provider, MD  warfarin (COUMADIN) 4 MG tablet Take 1 tablet (4 mg total) by mouth daily. Take with a 0.5 mg to make 4.5 mg 11/11/12  Yes Danae Orleans, PA-C  ferrous sulfate 325 (65 FE) MG tablet Take 1 tablet (325 mg total) by mouth 3 (three) times daily after meals. Patient not taking: Reported on 01/11/2015 11/09/12   Danae Orleans, PA-C  guaiFENesin (MUCINEX) 600 MG 12 hr tablet Take 1 tablet (600 mg total) by mouth 2 (two) times daily as needed for congestion. Patient not taking: Reported on 01/11/2015 11/11/12   Danae Orleans, PA-C  HYDROcodone-acetaminophen (NORCO/VICODIN) 5-325 MG per tablet Take 1-2 tablets by mouth every 4 (four) hours as needed for pain. Patient not taking: Reported on 01/11/2015 11/11/12    Danae Orleans, PA-C  methocarbamol (ROBAXIN) 500 MG tablet Take 1 tablet (500 mg total) by mouth every 6 (six) hours as needed (muscle spasms). Patient not taking: Reported on 01/11/2015 11/11/12   Danae Orleans, PA-C  sertraline (ZOLOFT) 50 MG tablet Take 50 mg by mouth at bedtime.    Historical Provider, MD    Physical Exam: Filed Vitals:   01/11/15 0121 01/11/15 0200 01/11/15 0245 01/11/15 0401  BP: 124/71     Pulse: 74     Temp: 98.7 F (37.1 C)     TempSrc: Oral     Resp: 18     Height:  6' (1.829 m)    Weight:  136.079 kg (300 lb) 127.1 kg (280 lb 3.3 oz)  SpO2: 94%   89%    General: Alert, Awake and Oriented to Time, Place and Person. Appear in moderate distress Eyes: PERRL ENT: Oral Mucosa clear moist. Neck: no JVD Cardiovascular: S1 and S2 Present, no Murmur, Peripheral Pulses Present Respiratory: Bilateral Air entry equal and Decreased,  no Crackles, bilateral expiratory wheezes Abdomen: Bowel Sound sluggish, hard around umbilical area soft elsewhere henre and diffusely tender Skin: no Rash Extremities: Trace  Pedal edema, no calf tenderness Neurologic: Grossly no focal neuro deficit.  Labs on Admission:  CBC:  Recent Labs Lab 01/11/15 0325  WBC 14.5*  NEUTROABS 12.6*  HGB 14.4  HCT 43.4  MCV 87.7  PLT 194    CMP     Component Value Date/Time   NA 139 01/11/2015 0325   K 4.4 01/11/2015 0325   CL 96* 01/11/2015 0325   CO2 31 01/11/2015 0325   GLUCOSE 145* 01/11/2015 0325   BUN 26* 01/11/2015 0325   CREATININE 2.44* 01/11/2015 0325   CALCIUM 9.2 01/11/2015 0325   PROT 8.4* 01/11/2015 0325   ALBUMIN 4.1 01/11/2015 0325   AST 17 01/11/2015 0325   ALT 11* 01/11/2015 0325   ALKPHOS 63 01/11/2015 0325   BILITOT 1.6* 01/11/2015 0325   GFRNONAA 24* 01/11/2015 0325   GFRAA 28* 01/11/2015 0325    No results for input(s): LIPASE, AMYLASE in the last 168 hours.  No results for input(s): CKTOTAL, CKMB, CKMBINDEX, TROPONINI in the last 168 hours. BNP  (last 3 results)  Recent Labs  01/11/15 0325  BNP 119.7*    ProBNP (last 3 results) No results for input(s): PROBNP in the last 8760 hours.   Radiological Exams on Admission: No results found. EKG: Independently reviewed. normal sinus rhythm, RBBB, PAC's noted.  Assessment/Plan Principal Problem:   SBO (small bowel obstruction) Active Problems:   Morbid obesity   Essential hypertension, benign   COPD exacerbation   Acute and chronic respiratory failure with hypoxia   Acute-on-chronic kidney injury   Atrial fibrillation   Pulmonary embolism   1. SBO (small bowel obstruction) The patient is presenting with complaints of abdominal pain as well as compressive constipation. He has significant abdominal tenderness which is present diffusely and also has significant abdominal induration around tablet daily ADL that he has a ventral hernia. A CT scan of the abdomen performed outside is showing high-grade obstruction with incarcerated well in the ventral hernia. Surgery has been consulted will be following up with the patient. Patient may require surgical intervention but appears to be significantly high risk for any procedures. Patient has developed acute kidney injury as well as acute on chronic respiratory failure associated with hypoxia. I will reverse his INR as his history of pulmonary embolism is more than 54-month-old and he denies having any other indication for continuous anticoagulation.  2. COPD exacerbation. Examination reveals bilateral wheezing and ABG revealed significant hypoxia. With this I will use oxygen as needed, Solu-Medrol, DuoNeb's, vancomycin and Zosyn. Monitor on telemetry. Solu-Medrol can interfere with wound healing as well as can cause further GI disturbances. Continue close monitoring.  3. History of PE as well as A. fib. Patient is on chronic warfarin. I will reverse with vitamin K and FFP.  4. History of hypertension. Currently holding all his  blood pressure medications.  5. Acute on chronic kidney disease. Associated with ongoing infection as well as obstruction. IV hydration. Monitor ins and outs.  Advance goals of care discussion: DNR/DNI Consults: I discussed with Dr Delana Meyer from surgery.  DVT  Prophylaxis: on chronic therapeutic anticoagulation. Nutrition: Nothing by mouth except medications   Family Communication: family was present at bedside, opportunity was given to ask question and all questions were answered satisfactorily at the time of interview. Disposition: Admitted as inpatient, telemetry unit.  Author: Berle Mull, MD [facility] Pager: [phone] 01/11/2015  If 7PM-7AM, please contact night-coverage [email or web address] Password [email or web address]

## 2015-01-11 NOTE — Consult Note (Signed)
PULMONARY / CRITICAL CARE MEDICINE   Name: Gary Boyer MRN: [id number] DOB: 18-Jun-1936    ADMISSION DATE:  01/11/2015 CONSULTATION DATE:  01/11/15  REQUESTING MD :     INITIAL PRESENTATION:  26 M with PMH of prior abdominal surgery for resection of benign gastric tumor, COPD, prior PE, PAF, OSA adm early AM 7/08 by [facility] in transfer from [facility] with 4 days of abdominal pain due to incarcerated ventral hernia and SBO. His anticoagulation was reversed and he was taken to OR for ex lap, small bowel resection, subtotal omentectomy, repair strangulated incisional hernia, application of neg pressure dressing. Because of high FiO2 reqts intra- and post- operatively, he was left intubated post operatively  MAJOR EVENTS/TEST RESULTS: 7/08 Transferred to [facility], [facility] service with SBO 7/08 CCS consultation. Warfarin reversed. Ex lap for small bowel resection, subtotal omentectomy, repair strangulated incisional hernia, application of neg pressure dressing 7/08 PCCM consultation  INDWELLING DEVICES:: ETT 7/08 >>   MICRO DATA: MRSA PCR 7/08 >> NEG Blood 7/08 >>  Wound 7/08 >>    ANTIMICROBIALS:  Vanc 7/08 >>  Pip-tazo 7/08 >>      HISTORY OF PRESENT ILLNESS:   Level 5 caveat. History above obtained from records  PAST MEDICAL HISTORY :   has a past medical history of Dysrhythmia; COPD (chronic obstructive pulmonary disease); Shortness of breath; Pneumonia; Asthma; Pulmonary embolus (2011 OR 2012); Bilateral leg edema; History of kidney stones; H/O hiatal hernia; GERD (gastroesophageal reflux disease); Cancer; Arthritis; and Sleep apnea.  has past surgical history that includes MASS REMOVED FROM STOMACH - BENIGN (ABOUT 2008 ?); HEART ABLATION; Multiple tooth extractions; Tonsillectomy; and Total knee arthroplasty (Left, 11/08/2012). Prior to Admission medications   Medication Sig Start Date End Date Taking? Authorizing Provider  albuterol (PROVENTIL) (2.5 MG/3ML) 0.083% nebulizer solution  Take 2.5 mg by nebulization every 6 (six) hours as needed for wheezing.   Yes Historical Provider, MD  amLODipine (NORVASC) 10 MG tablet Take 10 mg by mouth every morning.   Yes Historical Provider, MD  arformoterol (BROVANA) 15 MCG/2ML NEBU Take 15 mcg by nebulization 2 (two) times daily.   Yes Historical Provider, MD  budesonide (PULMICORT) 0.5 MG/2ML nebulizer solution Take 0.5 mg by nebulization 2 (two) times daily.   Yes Historical Provider, MD  cholecalciferol (VITAMIN D) 1000 UNITS tablet Take 1,000 Units by mouth daily.   Yes Historical Provider, MD  enalapril (VASOTEC) 10 MG tablet Take 10 mg by mouth 2 (two) times daily.   Yes Historical Provider, MD  furosemide (LASIX) 40 MG tablet Take 40 mg by mouth daily.   Yes Historical Provider, MD  ipratropium (ATROVENT) 0.02 % nebulizer solution Take 500 mcg by nebulization 4 (four) times daily.   Yes Historical Provider, MD  polyethylene glycol (MIRALAX / GLYCOLAX) packet Take 17 g by mouth daily. Patient taking differently: Take 17 g by mouth once.  11/11/12  Yes Danae Orleans, PA-C  Potassium Citrate 15 MEQ (1620 MG) TBCR Take 1 tablet by mouth 2 (two) times daily.   Yes Historical Provider, MD  ranitidine (ZANTAC) 150 MG tablet Take 150 mg by mouth 2 (two) times daily.   Yes Historical Provider, MD  vitamin B-12 (CYANOCOBALAMIN) 1000 MCG tablet Take 1,000 mcg by mouth daily.   Yes Historical Provider, MD  warfarin (COUMADIN) 1 MG tablet Take 1 mg by mouth daily.   Yes Historical Provider, MD  warfarin (COUMADIN) 4 MG tablet Take 1 tablet (4 mg total) by mouth daily. Take with a  0.5 mg to make 4.5 mg Patient taking differently: Take 4 mg by mouth daily. Take with a 1mg  to make 5 mg 11/11/12  Yes Danae Orleans, PA-C  ferrous sulfate 325 (65 FE) MG tablet Take 1 tablet (325 mg total) by mouth 3 (three) times daily after meals. Patient not taking: Reported on 01/11/2015 11/09/12   Danae Orleans, PA-C  guaiFENesin (MUCINEX) 600 MG 12 hr tablet Take 1  tablet (600 mg total) by mouth 2 (two) times daily as needed for congestion. Patient not taking: Reported on 01/11/2015 11/11/12   Danae Orleans, PA-C  HYDROcodone-acetaminophen (NORCO/VICODIN) 5-325 MG per tablet Take 1-2 tablets by mouth every 4 (four) hours as needed for pain. Patient not taking: Reported on 01/11/2015 11/11/12   Danae Orleans, PA-C  methocarbamol (ROBAXIN) 500 MG tablet Take 1 tablet (500 mg total) by mouth every 6 (six) hours as needed (muscle spasms). Patient not taking: Reported on 01/11/2015 11/11/12   Danae Orleans, PA-C  sertraline (ZOLOFT) 50 MG tablet Take 50 mg by mouth at bedtime.    Historical Provider, MD   Allergies  Allergen Reactions  . Ciprofloxacin Diarrhea  . Morphine And Related     HALLUCINATIONS  . Zithromax [Azithromycin] Diarrhea    FAMILY HISTORY:  has no family status information on file.  SOCIAL HISTORY:  reports that he has quit smoking. His smoking use included Cigarettes. He has a 120 pack-year smoking history. He has never used smokeless tobacco. He reports that he does not drink alcohol or use illicit drugs.  REVIEW OF SYSTEMS:  N/A  SUBJECTIVE:   VITAL SIGNS: Temp:  [98.1 F (36.7 C)-99.1 F (37.3 C)] 99.1 F (37.3 C) (07/08 1600) Pulse Rate:  [70-95] 70 (07/08 1645) Resp:  [16-23] 23 (07/08 1645) BP: (84-160)/(40-129) 94/50 mmHg (07/08 1645) SpO2:  [89 %-97 %] 92 % (07/08 1645) Arterial Line BP: (84-150)/(39-76) 84/39 mmHg (07/08 1645) FiO2 (%):  [80 %] 80 % (07/08 1615) Weight:  [127.1 kg (280 lb 3.3 oz)-136.079 kg (300 lb)] 127.1 kg (280 lb 3.3 oz) (07/08 0245) HEMODYNAMICS:   VENTILATOR SETTINGS: Vent Mode:  [-] PRVC FiO2 (%):  [80 %] 80 % Set Rate:  [16 bmp-18 bmp] 16 bmp Vt Set:  [144 mL] 620 mL PEEP:  [5 cmH20] 5 cmH20 Plateau Pressure:  [22 cmH20] 22 cmH20 INTAKE / OUTPUT:  Intake/Output Summary (Last 24 hours) at 01/11/15 1706 Last data filed at 01/11/15 1631  Gross per 24 hour  Intake 5720.14 ml  Output   1350  ml  Net 4370.14 ml    PHYSICAL EXAMINATION: General:  Chronically ill appearing, intubated, RASS -3 Neuro: PERRL, MAEs, DTRs symmetric, No focal deficits HEENT: NCAT Cardiovascular: regular with occasional extrasystoles, no M notes Lungs: diminshed BS, no wheezes or other adventitious sounds Abdomen: Surgical wound present, no BS Ext: cool, diminished DP pulses, no edema  LABS:  CBC  Recent Labs Lab 01/11/15 0325 01/11/15 0650 01/11/15 1630  WBC 14.5* 16.2* 3.6*  HGB 14.4 14.6 12.0*  HCT 43.4 45.1 37.8*  PLT 194 208 136*   Coag's  Recent Labs Lab 01/11/15 0325 01/11/15 1205 01/11/15 1630  APTT 37  --   --   INR 2.14* 1.53* 1.63*   BMET  Recent Labs Lab 01/11/15 0325 01/11/15 1630  NA 139 140  K 4.4 4.0  CL 96* 105  CO2 31 25  BUN 26* 31*  CREATININE 2.44* 2.94*  GLUCOSE 145* 129*   Electrolytes  Recent Labs Lab 01/11/15 0325 01/11/15  1630  CALCIUM 9.2 7.3*   Sepsis Markers  Recent Labs Lab 01/11/15 0325  LATICACIDVEN 1.7   ABG  Recent Labs Lab 01/11/15 0355 01/11/15 1625  PHART 7.406 7.327*  PCO2ART 47.6* 46.9*  PO2ART 54.2* 64.9*   Liver Enzymes  Recent Labs Lab 01/11/15 0325  AST 17  ALT 11*  ALKPHOS 63  BILITOT 1.6*  ALBUMIN 4.1   Cardiac Enzymes No results for input(s): TROPONINI, PROBNP in the last 168 hours. Glucose No results for input(s): GLUCAP in the last 168 hours.  CXR: bibasilar atx    ASSESSMENT / PLAN:  PULMONARY A: VDRF COPD H/O PE - doubt recurrence as was on full anticoagulation Hypoxemia, post op V/Q mismatch P:   Vent settings established Vent bundle implemented Nebulized steroids Nebulized bronchodilators   CARDIOVASCULAR A:  H/O PAF, s/p ablation Post op hypotension Likely hypovolemic  P:  NS X 1000 cc ordered Phenylephrine as needed to maintain MAP > 60 mmHg Consider CVL if hypotension persists or requires high dose of vasopressors  RENAL A:   AKI, likely due ot volume  depletion CKD - last Cr 1.55 11/10/12 P:   Monitor BMET intermittently Monitor I/Os Correct electrolytes as indicated Maintenance IVFs ordered  GASTROINTESTINAL A:   SBO due to incarcerated ventral hernia Post laparotomy 7/08 P:   SUP: IV PPI Decisions re: timing and route of nutrition per CCS  HEMATOLOGIC A:   Mild anemia without overt bleeding Mild thrombocytopenia Chronic warfarin prior to admission P:  DVT px: SCDs Monitor CBC intermittently Transfuse per usual ICU guidelines Resume full anticoagulation when OK with CCS  INFECTIOUS A:   SIRS, concern for sepsis/peritonitis P:   Micro and abx as above PCT algorithm ordered 7/08  ENDOCRINE A:   Mild hyperglycemia without prior dx of DM P:   Monitor glu on chem panels Consider SSI if glu > 180  NEUROLOGIC A:  Post op pain ICU associated discomfort P:   RASS goal: -2, -3 PAD protocol with propofol  PRN fentanyl   Discussed with Dr Glena Norfolk, MD ; [facility] service [place] [phone].  After 5:30 PM or weekends, call [phone] Pulmonary and [place] Pager: [phone]  01/11/2015, 5:06 PM

## 2015-01-11 NOTE — Care Management Note (Signed)
Case Management Note  Patient Details  Name: Gary Boyer MRN: [id number] Date of Birth: [birth year]  Subjective/Objective:   79 y/o m admitted w/SBO, incisional hernia,COPD.Hx:PE, afib, heart ablation.DNR.From home.                 Action/Plan:SX following-SB resection, ventral hernia repair in am.NGT,IVF,IV abx, nebs. Monitor progress for d/c needs.   Expected Discharge Date:               Expected Discharge Plan:  Home/Self Care  In-House Referral:     Discharge planning Services  CM Consult  Post Acute Care Choice:    Choice offered to:     DME Arranged:    DME Agency:     HH Arranged:    HH Agency:     Status of Service:  In process, will continue to follow  Medicare Important Message Given:    Date Medicare IM Given:    Medicare IM give by:    Date Additional Medicare IM Given:    Additional Medicare Important Message give by:     If discussed at [place] of Stay Meetings, dates discussed:    Additional Comments:  Dessa Phi, RN 01/11/2015, 12:35 PM

## 2015-01-11 NOTE — Consult Note (Signed)
General Surgery [facility]  Reason for Consult: ventral incisional hernia with small bowel obstruction  Referring Physician: Dr. Berle Mull, [facility]  Gary Boyer is an 79 y.o. male.   HPI:  Patient is a 79 yo WM transferred from [facility] in [place] from the emergency department for admission for incarcerated ventral hernia and small bowel obstruction.  Patient with significant PMHx of COPD on home oxygen, Afib on anticoagulation, CKD, and cirrhosis.  Patient presented with 3 day history of nausea. Evaluation at [facility] included CT scan of abdomen showing loops of small bowel in the ventral hernia with transition to non-dilated bowel, mild edema.  WBC elevated at 14K, but lactic acid level is normal.  Patient admitted to medical service for optimization in anticipation of urgent surgery for small bowel obstruction and incarcerated ventral incisional hernia.  Case discussed with ER MD at [facility] and with Dr. Posey Pronto at [facility].  Pt had laparotomy for ulcer disease in [place] approx 6 years ago.  Ventral incisional hernia developed about 4 years ago, usually reducible until 3 days ago.  Now with pain and nausea, no emesis.  Patient's wife at bedside.  Past Medical History  Diagnosis Date  . Dysrhythmia     PAST HX OF HEART ABLATION FOR IRREGULAR HEART RATE --SUCCESSFUL-NO FURTHER PROB WITH IRREGULAR HEART BEAT  . COPD (chronic obstructive pulmonary disease)     HX OF SMOKING FOR 60 YRS-PT SEES DR. Winona Legato IN [place]   . Shortness of breath     WITH ANY EXERTION-USES OXYGEN AS NEEDED  . Pneumonia     IN THE PAST - BUT NOT IN THE LAST YR  . Asthma   . Pulmonary embolus 2011 OR 2012    CHRONIC WARFARIN SINCE THE BLOOD CLOT  . Bilateral leg edema     WEARS SUPPORT HOSE  . History of kidney stones     SOME SMALL STONES AT PRESENT TIME- TAKES POTASSIUM CITRATE  . H/O hiatal hernia   . GERD (gastroesophageal reflux disease)   . Cancer    SKIN CANCERS  . Arthritis     ALL OVER  . Sleep apnea     USES BIPAP  SETTING 16 / 11 - PER PULMONOGIST'S OFFICE NOTE    Past Surgical History  Procedure Laterality Date  . Mass removed from stomach - benign  ABOUT 2008 ?  Marland Kitchen Heart ablation    . Multiple tooth extractions    . Tonsillectomy      T & A - AS A CHILD  . Total knee arthroplasty Left 11/08/2012    Procedure: LEFT TOTAL KNEE ARTHROPLASTY;  Surgeon: Mauri Pole, MD;  Location: [facility] ORS;  Service: Orthopedics;  Laterality: Left;    History reviewed. No pertinent family history.  Social History:  reports that he has quit smoking. His smoking use included Cigarettes. He has a 120 pack-year smoking history. He has never used smokeless tobacco. He reports that he does not drink alcohol or use illicit drugs.  Allergies:  Allergies  Allergen Reactions  . Ciprofloxacin Diarrhea  . Morphine And Related     HALLUCINATIONS  . Zithromax [Azithromycin] Diarrhea    Medications: I have reviewed the patient's current medications.  Results for orders placed or performed during the hospital encounter of 01/11/15 (from the past 48 hour(s))  Comprehensive metabolic panel     Status: Abnormal   Collection Time: 01/11/15  3:25 AM  Result Value Ref Range   Sodium  139 135 - 145 mmol/L   Potassium 4.4 3.5 - 5.1 mmol/L   Chloride 96 (L) 101 - 111 mmol/L   CO2 31 22 - 32 mmol/L   Glucose, Bld 145 (H) 65 - 99 mg/dL   BUN 26 (H) 6 - 20 mg/dL   Creatinine, Ser 2.44 (H) 0.61 - 1.24 mg/dL   Calcium 9.2 8.9 - 10.3 mg/dL   Total Protein 8.4 (H) 6.5 - 8.1 g/dL   Albumin 4.1 3.5 - 5.0 g/dL   AST 17 15 - 41 U/L   ALT 11 (L) 17 - 63 U/L   Alkaline Phosphatase 63 38 - 126 U/L   Total Bilirubin 1.6 (H) 0.3 - 1.2 mg/dL   GFR calc non Af Amer 24 (L) >60 mL/min   GFR calc Af Amer 28 (L) >60 mL/min    Comment: (NOTE) The eGFR has been calculated using the CKD EPI equation. This calculation has not been validated in all clinical situations. eGFR's  persistently <60 mL/min signify possible Chronic Kidney Disease.    Anion gap 12 5 - 15  CBC WITH DIFFERENTIAL     Status: Abnormal   Collection Time: 01/11/15  3:25 AM  Result Value Ref Range   WBC 14.5 (H) 4.0 - 10.5 K/uL   RBC 4.95 4.22 - 5.81 MIL/uL   Hemoglobin 14.4 13.0 - 17.0 g/dL   HCT 43.4 39.0 - 52.0 %   MCV 87.7 78.0 - 100.0 fL   MCH 29.1 26.0 - 34.0 pg   MCHC 33.2 30.0 - 36.0 g/dL   RDW 14.8 11.5 - 15.5 %   Platelets 194 150 - 400 K/uL   Neutrophils Relative % 87 (H) 43 - 77 %   Neutro Abs 12.6 (H) 1.7 - 7.7 K/uL   Lymphocytes Relative 5 (L) 12 - 46 %   Lymphs Abs 0.7 0.7 - 4.0 K/uL   Monocytes Relative 8 3 - 12 %   Monocytes Absolute 1.2 (H) 0.1 - 1.0 K/uL   Eosinophils Relative 0 0 - 5 %   Eosinophils Absolute 0.0 0.0 - 0.7 K/uL   Basophils Relative 0 0 - 1 %   Basophils Absolute 0.0 0.0 - 0.1 K/uL  Protime-INR     Status: Abnormal   Collection Time: 01/11/15  3:25 AM  Result Value Ref Range   Prothrombin Time 23.7 (H) 11.6 - 15.2 seconds   INR 2.14 (H) 0.00 - 1.49  APTT     Status: None   Collection Time: 01/11/15  3:25 AM  Result Value Ref Range   aPTT 37 24 - 37 seconds    Comment:        IF BASELINE aPTT IS ELEVATED, SUGGEST PATIENT RISK ASSESSMENT BE USED TO DETERMINE APPROPRIATE ANTICOAGULANT THERAPY.   Brain natriuretic peptide     Status: Abnormal   Collection Time: 01/11/15  3:25 AM  Result Value Ref Range   B Natriuretic Peptide 119.7 (H) 0.0 - 100.0 pg/mL  Lactic acid, plasma     Status: None   Collection Time: 01/11/15  3:25 AM  Result Value Ref Range   Lactic Acid, Venous 1.7 0.5 - 2.0 mmol/L  Type and screen     Status: None   Collection Time: 01/11/15  3:25 AM  Result Value Ref Range   ABO/RH(D) O NEG    Antibody Screen NEG    Sample Expiration 01/14/2015   Blood gas, arterial     Status: Abnormal   Collection Time: 01/11/15  3:55 AM  Result Value Ref Range   O2 Content 2.0 L/min   Delivery systems NASAL CANNULA    pH, Arterial  7.406 7.350 - 7.450   pCO2 arterial 47.6 (H) 35.0 - 45.0 mmHg   pO2, Arterial 54.2 (L) 80.0 - 100.0 mmHg   Bicarbonate 29.2 (H) 20.0 - 24.0 mEq/L   TCO2 25.5 0 - 100 mmol/L   Acid-Base Excess 4.1 (H) 0.0 - 2.0 mmol/L   O2 Saturation 85.1 %   Patient temperature 98.6    Collection site RIGHT RADIAL    Drawn by [id number]    Sample type ARTERIAL    Allens test (pass/fail) PASS PASS    No results found.  Review of Systems  HENT: Negative.   Eyes: Negative.   Respiratory: Positive for shortness of breath and wheezing.   Cardiovascular: Positive for leg swelling.  Gastrointestinal: Positive for nausea and abdominal pain.  Genitourinary: Negative.   Musculoskeletal: Negative.   Skin: Negative.   Neurological: Positive for weakness.  Endo/Heme/Allergies: Bruises/bleeds easily.  Psychiatric/Behavioral: Negative.    Blood pressure 124/71, pulse 74, temperature 98.7 F (37.1 C), temperature source Oral, resp. rate 18, height 6' (1.829 m), weight 127.1 kg (280 lb 3.3 oz), SpO2 89 %. Physical Exam  Constitutional: He is oriented to person, place, and time. He appears well-developed and well-nourished. No distress.  HENT:  Head: Normocephalic and atraumatic.  Right Ear: External ear normal.  Left Ear: External ear normal.  Eyes: Conjunctivae are normal. Pupils are equal, round, and reactive to light. No scleral icterus.  Neck: Normal range of motion. Neck supple. No tracheal deviation present. No thyromegaly present.  Cardiovascular: Normal rate.   Respiratory: Effort normal. He has wheezes (expiratory bilateral).  Rhonchi bilateral  GI: Soft. He exhibits no distension. There is tenderness (tender at mass lower midline surgical incision with incarcerated ventral incisional hernia).  Musculoskeletal: Normal range of motion. He exhibits edema (mild).  Lymphadenopathy:    He has no cervical adenopathy.  Neurological: He is alert and oriented to person, place, and time.  Skin: Skin is warm  and dry.  Psychiatric: He has a normal mood and affect. His behavior is normal.    Assessment/Plan:  Incarcerated ventral incisional hernia with small bowel obstruction  Will require laparotomy with repair of hernia, possible small bowel resection  Reversal of anticoagulation per medical service  Will discuss with Dr. Dalbert Batman ([facility]) this AM Dehydration, CKD Afib on anticoagulation COPD on home oxygen Probable cirrhosis of liver Obesity History of gastric surgery  Earnstine Regal, MD, [facility] Office: [place] 01/11/2015, 5:53 AM

## 2015-01-11 NOTE — Transfer of Care (Signed)
Immediate Anesthesia Transfer of Care Note  Patient: Gary Boyer  Procedure(s) Performed: Procedure(s): STRANGULATED VENTRAL HERNIA REPAIR, SUBTOTAL OMENTECTOMY, SMALL BOWEL RESECTION X2, PLACEMENT OF NEGATIVE PRESSURE DRESSING (N/A)  Patient Location: ICU  Anesthesia Type:General  Level of Consciousness: unresponsive and Patient remains intubated per anesthesia plan  Airway & Oxygen Therapy: Patient remains intubated per anesthesia plan and Patient placed on Ventilator (see vital sign flow sheet for setting)  Post-op Assessment: Report given to RN and Post -op Vital signs reviewed and stable  Post vital signs: Reviewed and stable  Last Vitals:  Filed Vitals:   01/11/15 1215  BP: 84/40  Pulse: 86  Temp: 36.7 C  Resp: 16    Complications: No apparent anesthesia complications

## 2015-01-11 NOTE — Progress Notes (Signed)
eLink Physician-Brief Progress Note Patient Name: Gary Boyer DOB: [birth year] MRN: [id number]   Date of Service  01/11/2015  HPI/Events of Note  Multiple issues: 1. Oliguria and 2. Ventilated patient needs sedation.   eICU Interventions  Will order: 1. 0.9 NaCl IV bolus. 2. Propofol IV infusion.      Intervention Category Intermediate Interventions: Oliguria - evaluation and management Minor Interventions: Agitation / anxiety - evaluation and management  Sommer,Steven Eugene 01/11/2015, 4:04 PM

## 2015-01-11 NOTE — Anesthesia Postprocedure Evaluation (Addendum)
  Anesthesia Post-op Note  Patient: Gary Boyer  Procedure(s) Performed: Procedure(s) (LRB): STRANGULATED VENTRAL HERNIA REPAIR, SUBTOTAL OMENTECTOMY, SMALL BOWEL RESECTION X2, PLACEMENT OF NEGATIVE PRESSURE DRESSING (N/A)  Patient Location: ICU  Anesthesia Type: General  Level of Consciousness: sedated  Airway and Oxygen Therapy: Intubated and ventilated.  Post-op Pain: none apparent  Post-op Assessment: Post-op Vital signs reviewed, some hypotension on neosynephrine drip. Intubated and ventilated per plan.  Intake: 3600 cc cystalloid  250 cc Albumin  EBL 250 cc  Urine output 50 cc   Urine output low. Will be transfused one more unit FFP. Care per CCM.  Last Vitals:  Filed Vitals:   01/11/15 1715  BP: 98/51  Pulse: 72  Temp:   Resp: 23    Post-op Vital Signs: stable   Complications: No apparent anesthesia complications

## 2015-01-11 NOTE — Progress Notes (Addendum)
General Surgery:  I have interviewed and examined this patient this morning. I discussed his presentation with Dr. Harlow Asa. I plan to review his CT scan with radiology this morning, assuming they can access the images.  It appears that he has an incarcerated incisional hernia containing small bowel loops with resulting small bowel obstruction. On exam his abdomen is obese and relatively soft but there is a tender mass to the right of the midline incision.  The skin looks reasonably healthy.  For now he will be treated with nasogastric suction and Foley catheter to monitor output. Because of his anticoagulated state, he will be given vitamin K and 3 units of FFP.  We will recheck his INR at noon. Plan to proceed with surgery in the early afternoon with midline laparotomy, evaluation of small bowel, possible bowel resection, repair of ventral hernia.  I discussed the indications, details, techniques, and numerous risk of the surgery the patient and his wife.  They're aware of the risk of bleeding, infection, recurrence of the hernia, postop sepsis, cardiac, pulmonary, and thromboembolic problems.  They're aware that he has multiple medical problems which increases his risk.  They understand all these issues.  All of their questions are answered.  They agree with this plan.   Edsel Petrin. Dalbert Batman, M.D., [facility] General and Minimally invasive Surgery Breast and Colorectal Surgery Office:   [phone] Pager:   [phone]

## 2015-01-12 ENCOUNTER — Inpatient Hospital Stay (HOSPITAL_COMMUNITY): Payer: Medicare Other

## 2015-01-12 DIAGNOSIS — G8918 Other acute postprocedural pain: Secondary | ICD-10-CM

## 2015-01-12 DIAGNOSIS — J9601 Acute respiratory failure with hypoxia: Secondary | ICD-10-CM | POA: Diagnosis present

## 2015-01-12 DIAGNOSIS — R579 Shock, unspecified: Secondary | ICD-10-CM

## 2015-01-12 DIAGNOSIS — Z7901 Long term (current) use of anticoagulants: Secondary | ICD-10-CM

## 2015-01-12 DIAGNOSIS — N179 Acute kidney failure, unspecified: Secondary | ICD-10-CM

## 2015-01-12 LAB — BASIC METABOLIC PANEL
Anion gap: 9 (ref 5–15)
BUN: 36 mg/dL — AB (ref 6–20)
CHLORIDE: 111 mmol/L (ref 101–111)
CO2: 22 mmol/L (ref 22–32)
Calcium: 7.2 mg/dL — ABNORMAL LOW (ref 8.9–10.3)
Creatinine, Ser: 2.52 mg/dL — ABNORMAL HIGH (ref 0.61–1.24)
GFR, EST AFRICAN AMERICAN: 27 mL/min — AB (ref >60)
GFR, EST NON AFRICAN AMERICAN: 23 mL/min — AB (ref >60)
GLUCOSE: 97 mg/dL (ref 65–99)
Potassium: 4.2 mmol/L (ref 3.5–5.1)
SODIUM: 142 mmol/L (ref 135–145)

## 2015-01-12 LAB — CBC
HCT: 38.1 % — ABNORMAL LOW (ref 39.0–52.0)
Hemoglobin: 12 g/dL — ABNORMAL LOW (ref 13.0–17.0)
MCH: 28.9 pg (ref 26.0–34.0)
MCHC: 31.5 g/dL (ref 30.0–36.0)
MCV: 91.8 fL (ref 78.0–100.0)
PLATELETS: 171 10*3/uL (ref 150–400)
RBC: 4.15 MIL/uL — ABNORMAL LOW (ref 4.22–5.81)
RDW: 15.4 % (ref 11.5–15.5)
WBC: 11.1 10*3/uL — ABNORMAL HIGH (ref 4.0–10.5)

## 2015-01-12 LAB — COMPREHENSIVE METABOLIC PANEL
ALT: 16 U/L — ABNORMAL LOW (ref 17–63)
ANION GAP: 10 (ref 5–15)
AST: 71 U/L — AB (ref 15–41)
Albumin: 3 g/dL — ABNORMAL LOW (ref 3.5–5.0)
Alkaline Phosphatase: 42 U/L (ref 38–126)
BILIRUBIN TOTAL: 1.8 mg/dL — AB (ref 0.3–1.2)
BUN: 37 mg/dL — AB (ref 6–20)
CHLORIDE: 101 mmol/L (ref 101–111)
CO2: 28 mmol/L (ref 22–32)
CREATININE: 3.1 mg/dL — AB (ref 0.61–1.24)
Calcium: 7 mg/dL — ABNORMAL LOW (ref 8.9–10.3)
GFR calc Af Amer: 21 mL/min — ABNORMAL LOW (ref >60)
GFR calc non Af Amer: 18 mL/min — ABNORMAL LOW (ref >60)
GLUCOSE: 115 mg/dL — AB (ref 65–99)
Potassium: 4.1 mmol/L (ref 3.5–5.1)
Sodium: 139 mmol/L (ref 135–145)
Total Protein: 6.8 g/dL (ref 6.5–8.1)

## 2015-01-12 LAB — BLOOD GAS, ARTERIAL
Acid-base deficit: 0.7 mmol/L (ref 0.0–2.0)
Bicarbonate: 23.9 mEq/L (ref 20.0–24.0)
DRAWN BY: 257701
FIO2: 0.5 %
LHR: 16 {breaths}/min
O2 Saturation: 93.8 %
PATIENT TEMPERATURE: 98.6
PEEP/CPAP: 5 cmH2O
PH ART: 7.38 (ref 7.350–7.450)
TCO2: 21.2 mmol/L (ref 0–100)
VT: 620 mL
pCO2 arterial: 41.3 mmHg (ref 35.0–45.0)
pO2, Arterial: 73.1 mmHg — ABNORMAL LOW (ref 80.0–100.0)

## 2015-01-12 LAB — PROTIME-INR
INR: 1.57 — ABNORMAL HIGH (ref 0.00–1.49)
Prothrombin Time: 18.8 seconds — ABNORMAL HIGH (ref 11.6–15.2)

## 2015-01-12 LAB — PHOSPHORUS: PHOSPHORUS: 4.6 mg/dL (ref 2.5–4.6)

## 2015-01-12 LAB — PROCALCITONIN: PROCALCITONIN: 5.08 ng/mL

## 2015-01-12 LAB — TROPONIN I: Troponin I: 0.04 ng/mL — ABNORMAL HIGH (ref <0.031)

## 2015-01-12 LAB — MAGNESIUM: MAGNESIUM: 1.8 mg/dL (ref 1.7–2.4)

## 2015-01-12 MED ORDER — LACTATED RINGERS IV BOLUS (SEPSIS)
1000.0000 mL | Freq: Once | INTRAVENOUS | Status: AC
  Administered 2015-01-12: 1000 mL via INTRAVENOUS

## 2015-01-12 MED ORDER — LIP MEDEX EX OINT
1.0000 "application " | TOPICAL_OINTMENT | Freq: Two times a day (BID) | CUTANEOUS | Status: DC
  Administered 2015-01-12 – 2015-01-17 (×10): 1 via TOPICAL
  Filled 2015-01-12 (×2): qty 7

## 2015-01-12 MED ORDER — CETYLPYRIDINIUM CHLORIDE 0.05 % MT LIQD
7.0000 mL | Freq: Four times a day (QID) | OROMUCOSAL | Status: DC
  Administered 2015-01-12 – 2015-01-13 (×5): 7 mL via OROMUCOSAL

## 2015-01-12 MED ORDER — ALUM & MAG HYDROXIDE-SIMETH 200-200-20 MG/5ML PO SUSP: 30.0000 mL | Freq: Four times a day (QID) | ORAL | Status: DC | PRN

## 2015-01-12 MED ORDER — LACTATED RINGERS IV BOLUS (SEPSIS)
1000.0000 mL | Freq: Three times a day (TID) | INTRAVENOUS | Status: DC | PRN
  Administered 2015-01-12: 1000 mL via INTRAVENOUS
  Filled 2015-01-12: qty 1000

## 2015-01-12 MED ORDER — MENTHOL 3 MG MT LOZG
1.0000 | LOZENGE | OROMUCOSAL | Status: DC | PRN
  Administered 2015-01-13: 3 mg via ORAL
  Filled 2015-01-12: qty 9

## 2015-01-12 MED ORDER — PHENOL 1.4 % MT LIQD: 2.0000 | OROMUCOSAL | Status: DC | PRN

## 2015-01-12 MED ORDER — SODIUM CHLORIDE 0.9 % IV BOLUS (SEPSIS)
500.0000 mL | Freq: Once | INTRAVENOUS | Status: AC
  Administered 2015-01-12: 500 mL via INTRAVENOUS

## 2015-01-12 MED ORDER — SODIUM CHLORIDE 0.9 % IV SOLN
INTRAVENOUS | Status: DC
  Administered 2015-01-12: 1000 mL via INTRAVENOUS
  Administered 2015-01-13 – 2015-01-15 (×3): via INTRAVENOUS

## 2015-01-12 MED ORDER — MAGIC MOUTHWASH
15.0000 mL | Freq: Four times a day (QID) | ORAL | Status: DC | PRN
  Filled 2015-01-12: qty 15

## 2015-01-12 MED ORDER — DIPHENHYDRAMINE HCL 50 MG/ML IJ SOLN: 12.5000 mg | Freq: Four times a day (QID) | INTRAMUSCULAR | Status: DC | PRN

## 2015-01-12 MED ORDER — FENTANYL CITRATE (PF) 100 MCG/2ML IJ SOLN
50.0000 ug | INTRAMUSCULAR | Status: DC | PRN
  Administered 2015-01-12 – 2015-01-13 (×4): 100 ug via INTRAVENOUS
  Filled 2015-01-12 (×4): qty 2

## 2015-01-12 MED ORDER — SODIUM CHLORIDE 0.9 % IV BOLUS (SEPSIS)
2000.0000 mL | Freq: Once | INTRAVENOUS | Status: AC
  Administered 2015-01-12: 2000 mL via INTRAVENOUS

## 2015-01-12 MED ORDER — BISACODYL 10 MG RE SUPP: 10.0000 mg | Freq: Two times a day (BID) | RECTAL | Status: DC | PRN

## 2015-01-12 MED ORDER — CHLORHEXIDINE GLUCONATE 0.12 % MT SOLN
15.0000 mL | Freq: Two times a day (BID) | OROMUCOSAL | Status: DC
  Administered 2015-01-12 – 2015-01-13 (×3): 15 mL via OROMUCOSAL
  Filled 2015-01-12 (×2): qty 15

## 2015-01-12 NOTE — Consult Note (Signed)
PULMONARY / CRITICAL CARE MEDICINE   Name: Gary Boyer MRN: [id number] DOB: [birth year]    ADMISSION DATE:  01/11/2015 CONSULTATION DATE:  01/11/15    INITIAL PRESENTATION:  21 M with PMH of prior abdominal surgery for resection of benign gastric tumor, COPD, prior PE, PAF, OSA adm early AM 7/08 by [facility] in transfer from [facility] with 4 days of abdominal pain due to incarcerated ventral hernia and SBO. His anticoagulation was reversed and he was taken to OR for ex lap, small bowel resection, subtotal omentectomy, repair strangulated incisional hernia, application of neg pressure dressing. Because of high FiO2 reqts intra- and post- operatively, he was left intubated post operatively  INDWELLING DEVICES:: ETT 7/08 >>   MICRO DATA: MRSA PCR 7/08 >> NEG Blood 7/08 >>  Wound 7/08 >>    ANTIMICROBIALS:  Vanc 7/08 >>  Pip-tazo 7/08 >>    MAJOR EVENTS/TEST RESULTS: 7/08 Transferred to [facility], [facility] service with SBO 7/08 CCS consultation. Warfarin reversed. Ex lap for small bowel resection, subtotal omentectomy, repair strangulated incisional hernia, application of neg pressure dressing 7/08 PCCM consultation   SUBJECTIVE:  01/12/15: 50% fio2 need. On neo gtt + diprivan gtt + urine dark. Family says he has been NPO for several days prior to admit. Creatinne rising and in ATN   VITAL SIGNS: Temp:  [99.1 F (37.3 C)-100.7 F (38.2 C)] 100.3 F (37.9 C) (07/09 1156) Pulse Rate:  [57-95] 58 (07/09 1150) Resp:  [16-26] 18 (07/09 1150) BP: (86-160)/(44-129) 113/75 mmHg (07/09 0920) SpO2:  [86 %-98 %] 97 % (07/09 1150) Arterial Line BP: (68-150)/(38-76) 114/51 mmHg (07/09 1150) FiO2 (%):  [50 %-80 %] 50 % (07/09 0920) Weight:  [135 kg (297 lb 9.9 oz)-135.9 kg (299 lb 9.7 oz)] 135.9 kg (299 lb 9.7 oz) (07/09 0609) HEMODYNAMICS:   VENTILATOR SETTINGS: Vent Mode:  [-] PRVC FiO2 (%):  [50 %-80 %] 50 % Set Rate:  [16 bmp-18 bmp] 16 bmp Vt Set:  [620 mL] 620 mL PEEP:  [5 cmH20] 5  cmH20 Plateau Pressure:  [17 cmH20-22 cmH20] 17 cmH20 INTAKE / OUTPUT:  Intake/Output Summary (Last 24 hours) at 01/12/15 1252 Last data filed at 01/12/15 1143  Gross per 24 hour  Intake 9277.58 ml  Output   2150 ml  Net 7127.58 ml    PHYSICAL EXAMINATION: General:  Chronically ill appearing, intubated, RASS -2 Neuro: PERRL, MAEs, DTRs symmetric, No focal deficits. Nods occ for questions HEENT: NCAT Cardiovascular: regular with occasional extrasystoles, no M notes Lungs: diminshed BS, no wheezes or other adventitious sounds Abdomen: Surgical wound present, no BS Ext: cool, diminished DP pulses, no edema  LABS:  PULMONARY  Recent Labs Lab 01/11/15 0355 01/11/15 1625 01/12/15 1230  PHART 7.406 7.327* 7.380  PCO2ART 47.6* 46.9* 41.3  PO2ART 54.2* 64.9* 73.1*  HCO3 29.2* 23.8 23.9  TCO2 25.5 22.0 21.2  O2SAT 85.1 88.8 93.8    CBC  Recent Labs Lab 01/11/15 0650 01/11/15 1630 01/12/15 0622  HGB 14.6 12.0* 12.0*  HCT 45.1 37.8* 38.1*  WBC 16.2* 3.6* 11.1*  PLT 208 136* 171    COAGULATION  Recent Labs Lab 01/11/15 0325 01/11/15 1205 01/11/15 1630 01/12/15 0622  INR 2.14* 1.53* 1.63* 1.57*    CARDIAC   Recent Labs Lab 01/11/15 1630 01/12/15 0622  TROPONINI <0.03 0.04*   No results for input(s): PROBNP in the last 168 hours.   CHEMISTRY  Recent Labs Lab 01/11/15 0325 01/11/15 1630 01/12/15 0622  NA 139 140 139  K  4.4 4.0 4.1  CL 96* 105 101  CO2 31 25 28   GLUCOSE 145* 129* 115*  BUN 26* 31* 37*  CREATININE 2.44* 2.94* 3.10*  CALCIUM 9.2 7.3* 7.0*  MG  --   --  1.8  PHOS  --   --  4.6   Estimated Creatinine Clearance: 28.9 mL/min (by C-G formula based on Cr of 3.1).   LIVER  Recent Labs Lab 01/11/15 0325 01/11/15 1205 01/11/15 1630 01/12/15 0622  AST 17  --   --  71*  ALT 11*  --   --  16*  ALKPHOS 63  --   --  42  BILITOT 1.6*  --   --  1.8*  PROT 8.4*  --   --  6.8  ALBUMIN 4.1  --   --  3.0*  INR 2.14* 1.53* 1.63*  1.57*     INFECTIOUS  Recent Labs Lab 01/11/15 0325 01/11/15 1630 01/12/15 0622  LATICACIDVEN 1.7  --   --   PROCALCITON  --  1.09 5.08     ENDOCRINE CBG (last 3)  No results for input(s): GLUCAP in the last 72 hours.       IMAGING x48h  - image(s) personally visualized  -   highlighted in bold Dg Chest Port 1 View  01/12/2015   CLINICAL DATA:  Acute on chronic respiratory failure with hypoxia. On ventilator. COPD. Pulmonary embolism. Acute on chronic kidney injury.  EXAM: PORTABLE CHEST - 1 VIEW  COMPARISON:  01/11/2015  FINDINGS: Cardiomegaly and ectasia of the thoracic aorta remains stable allowing for differences in patient positioning. Support lines and tubes in appropriate position. No pneumothorax visualized.  There is persistent opacity in the left retrocardiac lung base, which may be due to atelectasis or infiltrate. Right lung remains grossly clear.  IMPRESSION: Stable low lung volumes with left retrocardiac atelectasis versus infiltrate.  Stable cardiomegaly and ectatic thoracic aorta.   Electronically Signed   By: Earle Gell M.D.   On: 01/12/2015 09:39   Dg Chest Port 1 View  01/11/2015   CLINICAL DATA:  Acute and chronic respiratory failure with hypoxia. COPD. Endotracheal tube placement.  EXAM: PORTABLE CHEST - 1 VIEW  COMPARISON:  01/10/2015  FINDINGS: Endotracheal tube tip is seen approximately 5 cm above carina. Nasogastric tube is seen extending into the stomach.  Low lung volumes are noted. There is bibasilar atelectasis which is increased since previous study. Mild cardiomegaly remains stable.  IMPRESSION: The endotracheal tube and nasogastric tube in appropriate position.  Low lung volumes with bibasilar atelectasis.   Electronically Signed   By: Earle Gell M.D.   On: 01/11/2015 16:30   Dg Abd Acute W/chest  01/11/2015   CLINICAL DATA:  Shortness of breath.  NG tube placement.  EXAM: DG ABDOMEN ACUTE W/ 1V CHEST  COMPARISON:  Chest x-ray and CT scan dated  01/10/2015  FINDINGS: NG tube is coiled in the back of the oropharynx and proximal esophagus and needs to be pulled out and repositioned.  Heart size and pulmonary vascularity are normal. COPD. Scarring at the lung bases.  There is persistent small bowel dilatation with stairstep air-fluid levels consistent with small bowel obstruction. The stomach is distended with fluid.  Extensive calcifications in both kidneys.  Severe degenerative disc disease in the mid lumbar spine.  No free air.  No acute osseous abnormality.  IMPRESSION: 1. The NG tube is coiled in the back of the oropharynx and needs to be removed and repositioned. 2. Persistent  small bowel obstruction.  Fluid distended stomach. 3. Critical Value/emergent results were called by telephone at the time of interpretation on 01/11/2015 at 7:32 am to [facility], [place], who verbally acknowledged these results.   Electronically Signed   By: Lorriane Shire M.D.   On: 01/11/2015 07:33          ASSESSMENT / PLAN:  PULMONARY A: #Baseline COPD NOS H/O PE - doubt recurrence as was on full anticoagulation  #Now  - post op acute resp failure with hypoxemia - vq mismatch  P:   Vent settings established Vent bundle implemented Nebulized steroids Nebulized bronchodilators   CARDIOVASCULAR A:  H/O PAF, s/p ablation Post op hypotension Likely hypovolemic    - ongoing neosynephrine  need  P:  DC dioprivan 2L fluid bolus and then saline at 100cc/h Phenylephrine as needed to maintain MAP > 60 mmHg Consider CVL if hypotension persists or requires high dose of vasopressors  RENAL A:   #Baseline CKD - last Cr 1.55 11/10/12  #current  - In ATN v severe pre-renal  P:   Volume resus and recheck bmet at 9pm Monitor BMET intermittently Monitor I/Os Correct electrolytes as indicated Maintenance IVFs ordered  GASTROINTESTINAL A:   SBO due to incarcerated ventral hernia Post laparotomy 7/08 P:   SUP: IV PPI Decisions re: timing and route of  nutrition per CCS  HEMATOLOGIC A:   Mild anemia without overt bleeding Mild thrombocytopenia Chronic warfarin prior to admission P:  DVT px: SCDs Monitor CBC intermittently Transfuse per usual ICU guidelines Resume full anticoagulation when OK with CCS  INFECTIOUS A:   SIRS, concern for sepsis/peritonitis P:   Micro and abx as above PCT algorithm  ENDOCRINE A:   Mild hyperglycemia without prior dx of DM P:   Monitor glu on chem panels Consider SSI if glu > 180  NEUROLOGIC A:  Post op pain ICU associated discomfort  - appears well pain controlled  P:   RASS goal: -2, -3 DC h propofol  PRN fentanyl  FAMILY 01/11/15: PCCM MD Discussed with Dr Dalbert Batman of [place]. On 01/12/2015 - wife and daughers x 2 up[dated. No extubation til bp/creat better   The patient is critically ill with multiple organ systems failure and requires high complexity decision making for assessment and support, frequent evaluation and titration of therapies, application of advanced monitoring technologies and extensive interpretation of multiple databases.   Critical Care Time devoted to patient care services described in this note is  35  Minutes. This time reflects time of care of this signee Dr Brand Males. This critical care time does not reflect procedure time, or teaching time or supervisory time of PA/NP/Med student/Med Resident etc but could involve care discussion time    Dr. Brand Males, M.D., [facility], [place].C.P Pulmonary and Critical Care Medicine Staff Physician [facility] Pager: [phone], If no answer or between  15:00h - 7:00h: call [phone]  01/12/2015 12:52 PM

## 2015-01-12 NOTE — Progress Notes (Signed)
Patient intubated this AM on rounds. Will defer medical management to Critical Care team. As patient's condition improves we will plan on resuming medical management.  Please call flow manager when patient ready for management by [facility] at [phone], [place]

## 2015-01-12 NOTE — Progress Notes (Signed)
[place]  [address] [place], [place] [address] Phone: [phone] FAX: [place] [phone] August 12, 1935   Problem List:   Principal Problem:   SBO (small bowel obstruction) Active Problems:   Morbid obesity   Essential hypertension, benign   COPD exacerbation   Acute and chronic respiratory failure with hypoxia   Acute-on-chronic kidney injury   Atrial fibrillation   Pulmonary embolism   Incisional hernia with gangrene and obstruction   1 Day Post-Op  Date of Surgery: 01/11/2015  Pre op Diagnosis: Incarcerated incisional hernia with small bowel obstruction  Post op Diagnosis: Strangulated incisional hernia with small bowel obstruction, 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 This was probably 18-20 inches in length. After resecting this and performing an anastomosis there was a leak at the staple line not chose to resect the anastomosis and created an entirely new anastomosis, being concerned about the integrity of the bowel. The majority of the omentum was caught in the hernia and was necrotic and had to be resected. The rest of the small bowel and transverse colon looked normal. At the end of the case he seemed to be bleeding a little bit from most of the needle punctures and cut surfaces and so we plan to give further fresh frozen plasma. Dr. Eliseo Squires of the anesthesia department did not think he could be extubated and he was taken to the ICU on the ventilator.  Surgeon: Edsel Petrin. Dalbert Batman, M.D., FACS  Assessment  FAir/Poor  Plan:  -IV bolus with  poor UOP & inc Cr = ARF -IV ABx x 3 days for abdomen (no purulence) -wounc vac q MWF -vent per pulmon/CCM - help appreciated.  Could be days if needs aggressive rehydration to prevent severe ARF -VTE prophylaxis- SCDs, etc -mobilize as tolerated to help recovery  Adin Hector, M.D., F.A.C.S. Gastrointestinal and Minimally Invasive Surgery [facility] [address] [place], [place] [address] [phone] Main / Paging   01/12/2015  Subjective:  Intubated.  Weaning O2 Poor BP - inc IVF & Neo RN Ruby at bedside  Objective:  Vital signs:  Filed Vitals:   01/12/15 0609 01/12/15 0700 01/12/15 0715 01/12/15 0730  BP:  87/47    Pulse:  59 61 58  Temp:      TempSrc:      Resp:  23 24 22   Height:      Weight: 135.9 kg (299 lb 9.7 oz)     SpO2:  93% 93% 93%    Last BM Date: 01/10/15  Intake/Output   Yesterday:  07/08 0701 - 07/09 0700 In: 0233.4 [I.V.:5085.7; Blood:857.5; IV Piggyback:1825] Out: 2850 [Urine:500; Emesis/NG output:2100; Blood:250] This shift:     Bowel function:  Flatus: n  BM: n  Drain: thin NGT output.  Flushes well  Physical Exam:  General: Pt aintubated/sedated Eyes: PERRL, normal EOM.  Sclera clear.  No icterus Neuro: CN II-XII intact w/o focal sensory/motor deficits. Lymph: No head/neck/groin lymphadenopathy Psych:  No delerium/psychosis/paranoia HENT: Normocephalic, Mucus membranes moist.  No thrush.  ETT & NGT in place Neck: Supple, No tracheal deviation Chest:  No chest wall pain w good excursion CV:  Pulses intact.  Regular rhythm MS: Normal AROM  mjr joints.  No obvious deformity Abdomen: Soft.  Superobese w large panniculus.  Nondistended.   Wound vac in place.  No evidence of peritonitis.  No incarcerated hernias. Ext:  SCDs BLE.  No mjr edema.  No cyanosis Skin: No petechiae / purpura  Results:   Labs: Results for orders placed or performed during the hospital encounter of 01/11/15 (from  the past 48 hour(s))  Comprehensive metabolic panel     Status: Abnormal   Collection Time: 01/11/15  3:25 AM  Result Value Ref Range   Sodium 139 135 - 145 mmol/L   Potassium 4.4 3.5 - 5.1 mmol/L   Chloride 96 (L) 101 - 111 mmol/L   CO2 31 22 - 32 mmol/L   Glucose, Bld 145 (H) 65 - 99 mg/dL   BUN 26 (H) 6 - 20 mg/dL   Creatinine, Ser 2.44 (H) 0.61 - 1.24 mg/dL   Calcium 9.2 8.9 - 10.3 mg/dL   Total Protein 8.4 (H) 6.5 - 8.1 g/dL   Albumin 4.1 3.5 - 5.0 g/dL   AST 17 15 - 41 U/L   ALT 11 (L) 17 - 63 U/L   Alkaline Phosphatase 63 38 - 126 U/L   Total Bilirubin 1.6 (H) 0.3 - 1.2 mg/dL   GFR calc non Af Amer 24 (L) >60 mL/min   GFR calc Af Amer 28 (L) >60 mL/min    Comment: (NOTE) The eGFR has been calculated using the CKD EPI equation. This calculation has not been validated in all clinical situations. eGFR's persistently <60 mL/min signify possible Chronic Kidney Disease.    Anion gap 12 5 - 15  CBC WITH DIFFERENTIAL     Status: Abnormal   Collection Time: 01/11/15  3:25 AM  Result Value Ref Range   WBC 14.5 (H) 4.0 - 10.5 K/uL   RBC 4.95 4.22 - 5.81 MIL/uL   Hemoglobin 14.4 13.0 - 17.0 g/dL   HCT 43.4 39.0 - 52.0 %   MCV 87.7 78.0 - 100.0 fL   MCH 29.1 26.0 - 34.0 pg   MCHC 33.2 30.0 - 36.0 g/dL   RDW 14.8 11.5 - 15.5 %   Platelets 194 150 - 400 K/uL   Neutrophils Relative % 87 (H) 43 - 77 %   Neutro Abs 12.6 (H) 1.7 - 7.7 K/uL   Lymphocytes Relative 5 (L) 12 - 46 %   Lymphs Abs 0.7 0.7 - 4.0 K/uL   Monocytes Relative 8 3 - 12 %   Monocytes Absolute 1.2 (H) 0.1 - 1.0 K/uL   Eosinophils Relative 0 0 - 5 %   Eosinophils Absolute 0.0 0.0 - 0.7 K/uL   Basophils Relative 0 0 - 1 %   Basophils Absolute 0.0 0.0 - 0.1 K/uL  Protime-INR     Status: Abnormal   Collection Time: 01/11/15  3:25 AM  Result Value Ref Range   Prothrombin Time 23.7 (H) 11.6 - 15.2 seconds   INR 2.14 (H) 0.00 - 1.49  APTT     Status: None   Collection Time: 01/11/15  3:25 AM  Result Value Ref  Range   aPTT 37 24 - 37 seconds    Comment:        IF BASELINE aPTT IS ELEVATED, SUGGEST PATIENT RISK ASSESSMENT BE USED TO DETERMINE APPROPRIATE ANTICOAGULANT THERAPY.   Brain natriuretic peptide     Status: Abnormal   Collection Time: 01/11/15  3:25 AM  Result Value Ref Range   B Natriuretic Peptide 119.7 (H) 0.0 -  100.0 pg/mL  Lactic acid, plasma     Status: None   Collection Time: 01/11/15  3:25 AM  Result Value Ref Range   Lactic Acid, Venous 1.7 0.5 - 2.0 mmol/L  Type and screen     Status: None   Collection Time: 01/11/15  3:25 AM  Result Value Ref Range   ABO/RH(D) O NEG    Antibody Screen NEG    Sample Expiration 01/14/2015   Blood gas, arterial     Status: Abnormal   Collection Time: 01/11/15  3:55 AM  Result Value Ref Range   O2 Content 2.0 L/min   Delivery systems NASAL CANNULA    pH, Arterial 7.406 7.350 - 7.450   pCO2 arterial 47.6 (H) 35.0 - 45.0 mmHg   pO2, Arterial 54.2 (L) 80.0 - 100.0 mmHg   Bicarbonate 29.2 (H) 20.0 - 24.0 mEq/L   TCO2 25.5 0 - 100 mmol/L   Acid-Base Excess 4.1 (H) 0.0 - 2.0 mmol/L   O2 Saturation 85.1 %   Patient temperature 98.6    Collection site RIGHT RADIAL    Drawn by [id number]    Sample type ARTERIAL    Allens test (pass/fail) PASS PASS  Prepare fresh frozen plasma     Status: None (Preliminary result)   Collection Time: 01/11/15  6:30 AM  Result Value Ref Range   Unit Number [id number]    Blood Component Type THAWED PLASMA    Unit division 00    Status of Unit ISSUED    Transfusion Status OK TO TRANSFUSE    Unit Number [id number]    Blood Component Type THAWED PLASMA    Unit division 00    Status of Unit ISSUED    Transfusion Status OK TO TRANSFUSE    Unit Number [id number]    Blood Component Type THAWED PLASMA    Unit division 00    Status of Unit ISSUED    Transfusion Status OK TO TRANSFUSE   CBC     Status: Abnormal   Collection Time: 01/11/15  6:50 AM  Result Value Ref Range   WBC 16.2 (H) 4.0 - 10.5  K/uL   RBC 5.11 4.22 - 5.81 MIL/uL   Hemoglobin 14.6 13.0 - 17.0 g/dL   HCT 45.1 39.0 - 52.0 %   MCV 88.3 78.0 - 100.0 fL   MCH 28.6 26.0 - 34.0 pg   MCHC 32.4 30.0 - 36.0 g/dL   RDW 14.8 11.5 - 15.5 %   Platelets 208 150 - 400 K/uL  Urinalysis, Routine w reflex microscopic (not at [facility])     Status: Abnormal   Collection Time: 01/11/15  6:59 AM  Result Value Ref Range   Color, Urine AMBER (A) YELLOW    Comment: BIOCHEMICALS MAY BE AFFECTED BY COLOR   APPearance CLOUDY (A) CLEAR   Specific Gravity, Urine 1.023 1.005 - 1.030   pH 5.0 5.0 - 8.0   Glucose, UA NEGATIVE NEGATIVE mg/dL   Hgb urine dipstick MODERATE (A) NEGATIVE   Bilirubin Urine SMALL (A) NEGATIVE   Ketones, ur NEGATIVE NEGATIVE mg/dL   Protein, ur 30 (A) NEGATIVE mg/dL   Urobilinogen, UA 0.2 0.0 - 1.0 mg/dL   Nitrite NEGATIVE NEGATIVE   Leukocytes, UA MODERATE (A) NEGATIVE  Surgical pcr screen     Status: None   Collection Time: 01/11/15  6:59 AM  Result Value Ref Range   MRSA, PCR NEGATIVE NEGATIVE   Staphylococcus aureus NEGATIVE NEGATIVE    Comment:  The Xpert SA Assay (FDA approved for NASAL specimens in patients over 76 years of age), is one component of a comprehensive surveillance program.  Test performance has been validated by [facility] for patients greater than or equal to 61 year old. It is not intended to diagnose infection nor to guide or monitor treatment.   Urine microscopic-add on     Status: Abnormal   Collection Time: 01/11/15  6:59 AM  Result Value Ref Range   Squamous Epithelial / LPF RARE RARE   WBC, UA 7-10 <3 WBC/hpf   RBC / HPF 7-10 <3 RBC/hpf   Bacteria, UA FEW (A) RARE  Protime-INR     Status: Abnormal   Collection Time: 01/11/15 12:05 PM  Result Value Ref Range   Prothrombin Time 18.5 (H) 11.6 - 15.2 seconds   INR 1.53 (H) 0.00 - 1.49  Wound culture     Status: None (Preliminary result)   Collection Time: 01/11/15  1:41 PM  Result Value Ref Range   Specimen  Description WOUND INCARCERATED VENTRAL HERNIA    Special Requests PATIENT ON FOLLOWING VANCOMYCIN AND ZOSYN    Gram Stain PENDING    Culture      NO GROWTH 1 DAY Performed at [facility]    Report Status PENDING   Prepare fresh frozen plasma     Status: None (Preliminary result)   Collection Time: 01/11/15  2:57 PM  Result Value Ref Range   Unit Number [id number]    Blood Component Type THAWED PLASMA    Unit division 00    Status of Unit ALLOCATED    Transfusion Status OK TO TRANSFUSE   Blood gas, arterial     Status: Abnormal   Collection Time: 01/11/15  4:25 PM  Result Value Ref Range   FIO2 0.80 %   Delivery systems VENTILATOR    Mode PRESSURE REGULATED VOLUME CONTROL    VT 620 mL   Rate 18 resp/min   Peep/cpap 5.0 cm H20   pH, Arterial 7.327 (L) 7.350 - 7.450   pCO2 arterial 46.9 (H) 35.0 - 45.0 mmHg   pO2, Arterial 64.9 (L) 80.0 - 100.0 mmHg   Bicarbonate 23.8 20.0 - 24.0 mEq/L   TCO2 22.0 0 - 100 mmol/L   Acid-base deficit 1.8 0.0 - 2.0 mmol/L   O2 Saturation 88.8 %   Patient temperature 98.6    Collection site A-LINE    Drawn by [id number]    Sample type ARTERIAL DRAW    Allens test (pass/fail) PASS PASS  CBC     Status: Abnormal   Collection Time: 01/11/15  4:30 PM  Result Value Ref Range   WBC 3.6 (L) 4.0 - 10.5 K/uL   RBC 4.20 (L) 4.22 - 5.81 MIL/uL   Hemoglobin 12.0 (L) 13.0 - 17.0 g/dL   HCT 37.8 (L) 39.0 - 52.0 %   MCV 90.0 78.0 - 100.0 fL   MCH 28.6 26.0 - 34.0 pg   MCHC 31.7 30.0 - 36.0 g/dL   RDW 15.0 11.5 - 15.5 %   Platelets 136 (L) 150 - 400 K/uL  Basic metabolic panel     Status: Abnormal   Collection Time: 01/11/15  4:30 PM  Result Value Ref Range   Sodium 140 135 - 145 mmol/L   Potassium 4.0 3.5 - 5.1 mmol/L   Chloride 105 101 - 111 mmol/L   CO2 25 22 - 32 mmol/L   Glucose, Bld 129 (H) 65 - 99 mg/dL   BUN  31 (H) 6 - 20 mg/dL   Creatinine, Ser 2.94 (H) 0.61 - 1.24 mg/dL   Calcium 7.3 (L) 8.9 - 10.3 mg/dL   GFR calc non Af Amer  19 (L) >60 mL/min   GFR calc Af Amer 22 (L) >60 mL/min    Comment: (NOTE) The eGFR has been calculated using the CKD EPI equation. This calculation has not been validated in all clinical situations. eGFR's persistently <60 mL/min signify possible Chronic Kidney Disease.    Anion gap 10 5 - 15  Protime-INR     Status: Abnormal   Collection Time: 01/11/15  4:30 PM  Result Value Ref Range   Prothrombin Time 19.4 (H) 11.6 - 15.2 seconds   INR 1.63 (H) 0.00 - 1.49  Procalcitonin - Baseline     Status: None   Collection Time: 01/11/15  4:30 PM  Result Value Ref Range   Procalcitonin 1.09 ng/mL    Comment:        Interpretation: PCT > 0.5 ng/mL and <= 2 ng/mL: Systemic infection (sepsis) is possible, but other conditions are known to elevate PCT as well. (NOTE)         ICU PCT Algorithm               Non ICU PCT Algorithm    ----------------------------     ------------------------------         PCT < 0.25 ng/mL                 PCT < 0.1 ng/mL     Stopping of antibiotics            Stopping of antibiotics       strongly encouraged.               strongly encouraged.    ----------------------------     ------------------------------       PCT level decrease by               PCT < 0.25 ng/mL       >= 80% from peak PCT       OR PCT 0.25 - 0.5 ng/mL          Stopping of antibiotics                                             encouraged.     Stopping of antibiotics           encouraged.    ----------------------------     ------------------------------       PCT level decrease by              PCT >= 0.25 ng/mL       < 80% from peak PCT        AND PCT >= 0.5 ng/mL             Continuing antibiotics                                              encouraged.       Continuing antibiotics            encouraged.    ----------------------------     ------------------------------     PCT level increase compared  PCT > 0.5 ng/mL         with peak PCT AND          PCT >= 0.5 ng/mL              Escalation of antibiotics                                          strongly encouraged.      Escalation of antibiotics        strongly encouraged.   Troponin I     Status: None   Collection Time: 01/11/15  4:30 PM  Result Value Ref Range   Troponin I <0.03 <0.031 ng/mL    Comment:        NO INDICATION OF MYOCARDIAL INJURY.   Protime-INR     Status: Abnormal   Collection Time: 01/12/15  6:22 AM  Result Value Ref Range   Prothrombin Time 18.8 (H) 11.6 - 15.2 seconds   INR 1.57 (H) 0.00 - 1.49  CBC     Status: Abnormal   Collection Time: 01/12/15  6:22 AM  Result Value Ref Range   WBC 11.1 (H) 4.0 - 10.5 K/uL   RBC 4.15 (L) 4.22 - 5.81 MIL/uL   Hemoglobin 12.0 (L) 13.0 - 17.0 g/dL   HCT 38.1 (L) 39.0 - 52.0 %   MCV 91.8 78.0 - 100.0 fL   MCH 28.9 26.0 - 34.0 pg   MCHC 31.5 30.0 - 36.0 g/dL   RDW 15.4 11.5 - 15.5 %   Platelets 171 150 - 400 K/uL    Imaging / Studies: Dg Chest Port 1 View  01/11/2015   CLINICAL DATA:  Acute and chronic respiratory failure with hypoxia. COPD. Endotracheal tube placement.  EXAM: PORTABLE CHEST - 1 VIEW  COMPARISON:  01/10/2015  FINDINGS: Endotracheal tube tip is seen approximately 5 cm above carina. Nasogastric tube is seen extending into the stomach.  Low lung volumes are noted. There is bibasilar atelectasis which is increased since previous study. Mild cardiomegaly remains stable.  IMPRESSION: The endotracheal tube and nasogastric tube in appropriate position.  Low lung volumes with bibasilar atelectasis.   Electronically Signed   By: Earle Gell M.D.   On: 01/11/2015 16:30   Dg Abd Acute W/chest  01/11/2015   CLINICAL DATA:  Shortness of breath.  NG tube placement.  EXAM: DG ABDOMEN ACUTE W/ 1V CHEST  COMPARISON:  Chest x-ray and CT scan dated 01/10/2015  FINDINGS: NG tube is coiled in the back of the oropharynx and proximal esophagus and needs to be pulled out and repositioned.  Heart size and pulmonary vascularity are normal. COPD. Scarring  at the lung bases.  There is persistent small bowel dilatation with stairstep air-fluid levels consistent with small bowel obstruction. The stomach is distended with fluid.  Extensive calcifications in both kidneys.  Severe degenerative disc disease in the mid lumbar spine.  No free air.  No acute osseous abnormality.  IMPRESSION: 1. The NG tube is coiled in the back of the oropharynx and needs to be removed and repositioned. 2. Persistent small bowel obstruction.  Fluid distended stomach. 3. Critical Value/emergent results were called by telephone at the time of interpretation on 01/11/2015 at 7:32 am to [facility] [place], who verbally acknowledged these results.   Electronically Signed   By: Lorriane Shire M.D.   On: 01/11/2015 07:33  Medications / Allergies: per chart  Antibiotics: Anti-infectives    Start     Dose/Rate Route Frequency Ordered Stop   01/11/15 0700  vancomycin (VANCOCIN) 1,750 mg in sodium chloride 0.9 % 500 mL IVPB     1,750 mg 250 mL/hr over 120 Minutes Intravenous Daily 01/11/15 0648     01/11/15 0330  piperacillin-tazobactam (ZOSYN) IVPB 3.375 g     3.375 g 12.5 mL/hr over 240 Minutes Intravenous 3 times per day 01/11/15 0321          Note: Portions of this report may have been transcribed using voice recognition software. Every effort was made to ensure accuracy; however, inadvertent computerized transcription errors may be present.   Any transcriptional errors that result from this process are unintentional.     Adin Hector, M.D., F.A.C.S. Gastrointestinal and Minimally Invasive Surgery [facility] [address], [place], [place] [address] [phone] Main / Paging   01/12/2015  CARE TEAM:  PCP: No primary care provider on file.  Outpatient Care Team: No care team member to display  Inpatient Treatment Team: Treatment Team: Attending Provider: Velvet Bathe, MD; Registered Nurse: Mortimer Fries, RN; Consulting  Physician: Nolon Nations, MD; Rounding Team: Garner Gavel, MD; Rounding Team: Md Pccm, MD; Registered Nurse: Jolene Provost Main, RN; Registered Nurse: Jefm Petty, RN; Registered Nurse: Carl Best, RN; Technician: Abe People, NT

## 2015-01-12 NOTE — Progress Notes (Signed)
[facility] Progress Note Patient Name: Gary Boyer DOB: 26-Sep-1935 MRN: [id number]   Date of Service  01/12/2015  HPI/Events of Note  Ongoing hypotension with current MAP of 56 and goal of 60.  On 100 mcg of NEO for BP support.  eICU Interventions  Plan: Change dosing range to 200 mcg max of NEO Continue to monitor BP     Intervention Category Major Interventions: Hypotension - evaluation and management  DETERDING,ELIZABETH 01/12/2015, 12:00 AM

## 2015-01-13 ENCOUNTER — Inpatient Hospital Stay (HOSPITAL_COMMUNITY): Payer: Medicare Other

## 2015-01-13 DIAGNOSIS — J449 Chronic obstructive pulmonary disease, unspecified: Secondary | ICD-10-CM | POA: Diagnosis present

## 2015-01-13 DIAGNOSIS — J9601 Acute respiratory failure with hypoxia: Secondary | ICD-10-CM

## 2015-01-13 LAB — CBC WITH DIFFERENTIAL/PLATELET
Basophils Absolute: 0 10*3/uL (ref 0.0–0.1)
Basophils Relative: 0 % (ref 0–1)
Eosinophils Absolute: 0.1 10*3/uL (ref 0.0–0.7)
Eosinophils Relative: 1 % (ref 0–5)
HEMATOCRIT: 36.6 % — AB (ref 39.0–52.0)
Hemoglobin: 11.4 g/dL — ABNORMAL LOW (ref 13.0–17.0)
Lymphocytes Relative: 14 % (ref 12–46)
Lymphs Abs: 1.3 10*3/uL (ref 0.7–4.0)
MCH: 28.6 pg (ref 26.0–34.0)
MCHC: 31.1 g/dL (ref 30.0–36.0)
MCV: 91.7 fL (ref 78.0–100.0)
MONO ABS: 0.9 10*3/uL (ref 0.1–1.0)
Monocytes Relative: 9 % (ref 3–12)
NEUTROS ABS: 6.8 10*3/uL (ref 1.7–7.7)
Neutrophils Relative %: 76 % (ref 43–77)
PLATELETS: 155 10*3/uL (ref 150–400)
RBC: 3.99 MIL/uL — ABNORMAL LOW (ref 4.22–5.81)
RDW: 15.3 % (ref 11.5–15.5)
WBC: 9.1 10*3/uL (ref 4.0–10.5)

## 2015-01-13 LAB — COMPREHENSIVE METABOLIC PANEL
ALT: 19 U/L (ref 17–63)
AST: 85 U/L — AB (ref 15–41)
Albumin: 2.6 g/dL — ABNORMAL LOW (ref 3.5–5.0)
Alkaline Phosphatase: 42 U/L (ref 38–126)
Anion gap: 8 (ref 5–15)
BUN: 35 mg/dL — ABNORMAL HIGH (ref 6–20)
CALCIUM: 7.4 mg/dL — AB (ref 8.9–10.3)
CO2: 24 mmol/L (ref 22–32)
CREATININE: 2.22 mg/dL — AB (ref 0.61–1.24)
Chloride: 110 mmol/L (ref 101–111)
GFR calc Af Amer: 31 mL/min — ABNORMAL LOW (ref >60)
GFR, EST NON AFRICAN AMERICAN: 27 mL/min — AB (ref >60)
GLUCOSE: 101 mg/dL — AB (ref 65–99)
Potassium: 3.7 mmol/L (ref 3.5–5.1)
Sodium: 142 mmol/L (ref 135–145)
Total Bilirubin: 2.1 mg/dL — ABNORMAL HIGH (ref 0.3–1.2)
Total Protein: 6.1 g/dL — ABNORMAL LOW (ref 6.5–8.1)

## 2015-01-13 LAB — PREALBUMIN: Prealbumin: 8.3 mg/dL — ABNORMAL LOW (ref 18–38)

## 2015-01-13 LAB — PREPARE FRESH FROZEN PLASMA
UNIT DIVISION: 0
UNIT DIVISION: 0
UNIT DIVISION: 0

## 2015-01-13 LAB — MAGNESIUM: MAGNESIUM: 1.8 mg/dL (ref 1.7–2.4)

## 2015-01-13 LAB — PHOSPHORUS: Phosphorus: 3.5 mg/dL (ref 2.5–4.6)

## 2015-01-13 LAB — PROCALCITONIN: Procalcitonin: 3.83 ng/mL

## 2015-01-13 MED ORDER — VANCOMYCIN HCL 10 G IV SOLR: 1750.0000 mg | INTRAVENOUS | Status: DC

## 2015-01-13 MED ORDER — MAGNESIUM SULFATE IN D5W 10-5 MG/ML-% IV SOLN
1.0000 g | Freq: Once | INTRAVENOUS | Status: AC
  Administered 2015-01-13: 1 g via INTRAVENOUS
  Filled 2015-01-13: qty 100

## 2015-01-13 MED ORDER — CHLORHEXIDINE GLUCONATE 0.12 % MT SOLN
15.0000 mL | Freq: Two times a day (BID) | OROMUCOSAL | Status: DC
  Administered 2015-01-13 – 2015-01-17 (×8): 15 mL via OROMUCOSAL
  Filled 2015-01-13 (×10): qty 15

## 2015-01-13 MED ORDER — CETYLPYRIDINIUM CHLORIDE 0.05 % MT LIQD
7.0000 mL | Freq: Two times a day (BID) | OROMUCOSAL | Status: DC
  Administered 2015-01-14 – 2015-01-16 (×2): 7 mL via OROMUCOSAL

## 2015-01-13 MED ORDER — BUDESONIDE 0.25 MG/2ML IN SUSP
0.2500 mg | Freq: Two times a day (BID) | RESPIRATORY_TRACT | Status: DC
  Administered 2015-01-13 – 2015-01-14 (×2): 0.25 mg via RESPIRATORY_TRACT
  Filled 2015-01-13 (×3): qty 2

## 2015-01-13 MED ORDER — LACTATED RINGERS IV BOLUS (SEPSIS)
1000.0000 mL | Freq: Once | INTRAVENOUS | Status: AC
  Administered 2015-01-13: 1000 mL via INTRAVENOUS

## 2015-01-13 MED ORDER — FENTANYL CITRATE (PF) 100 MCG/2ML IJ SOLN
25.0000 ug | INTRAMUSCULAR | Status: DC | PRN
Start: 2015-01-13 — End: 2015-01-17
  Administered 2015-01-13 – 2015-01-16 (×10): 100 ug via INTRAVENOUS
  Filled 2015-01-13 (×10): qty 2

## 2015-01-13 NOTE — Progress Notes (Signed)
PULMONARY / CRITICAL CARE MEDICINE   Name: Gary Boyer MRN: [id number] DOB: 1935-11-04    ADMISSION DATE:  01/11/2015 CONSULTATION DATE:  01/11/15    INITIAL PRESENTATION:  47 M with PMH of prior abdominal surgery for resection of benign gastric tumor, COPD, prior PE, PAF, OSA adm early AM 7/08 by [facility] in transfer from [facility] with 4 days of abdominal pain due to incarcerated ventral hernia and SBO. His anticoagulation was reversed and he was taken to OR for ex lap, small bowel resection, subtotal omentectomy, repair strangulated incisional hernia, application of neg pressure dressing. Because of high FiO2 reqts intra- and post- operatively, he was left intubated post operatively  INDWELLING DEVICES:: ETT 7/08 >>   MICRO DATA: MRSA PCR 7/08 >> NEG Blood 7/08 >>  Wound 7/08 >>    ANTIMICROBIALS:  Vanc 7/08 >>  Pip-tazo 7/08 >>    MAJOR EVENTS/TEST RESULTS: 7/08 Transferred to [facility], [facility] service with SBO 7/08 CCS consultation. Warfarin reversed. Ex lap for small bowel resection, subtotal omentectomy, repair strangulated incisional hernia, application of neg pressure dressing 7/08 PCCM consultation  01/12/15: 50% fio2 need. On neo gtt + diprivan gtt + urine dark. Family says he has been NPO for several days prior to admit. Creatinne rising and in ATN   SUBJECTIVE/OVERNIGHT/INTERVAL HX 01/13/15: Creat near baseline with fluids. Almiost off pressors. Doing SBT and meets extubation criteria per RN and RT. RN asks for a-line to be dc;'ed. Fentanyl needs are minimal per RN  VITAL SIGNS: Temp:  [97.7 F (36.5 C)-101.3 F (38.5 C)] 97.7 F (36.5 C) (07/10 0800) Pulse Rate:  [57-81] 69 (07/10 0927) Resp:  [15-26] 21 (07/10 0927) BP: (96-105)/(40-68) 96/44 mmHg (07/10 0902) SpO2:  [90 %-97 %] 95 % (07/10 0927) Arterial Line BP: (68-123)/(43-66) 97/62 mmHg (07/10 0927) FiO2 (%):  [50 %] 50 % (07/10 0809) Weight:  [135.5 kg (298 lb 11.6 oz)] 135.5 kg (298 lb 11.6 oz) (07/10  0400) HEMODYNAMICS:   VENTILATOR SETTINGS: Vent Mode:  [-] PSV;CPAP FiO2 (%):  [50 %] 50 % Set Rate:  [16 bmp] 16 bmp Vt Set:  [620 mL] 620 mL PEEP:  [5 cmH20] 5 cmH20 Pressure Support:  [5 cmH20] 5 cmH20 Plateau Pressure:  [11 cmH20-19 cmH20] 11 cmH20 INTAKE / OUTPUT:  Intake/Output Summary (Last 24 hours) at 01/13/15 1004 Last data filed at 01/13/15 0900  Gross per 24 hour  Intake 5248.93 ml  Output   3325 ml  Net 1923.93 ml    PHYSICAL EXAMINATION: General:  Chronically ill appearing, intubated, RASS 0 Neuro: RASS 0. Moves all 4s. CAM-ICU negative for delirium HEENT: NCAT Cardiovascular: regular with occasional extrasystoles, no M notes Lungs: diminshed BS, no wheezes or other adventitious sounds Abdomen: Surgical wound present, no BS. Tenderness + but better Ext: cool, diminished DP pulses, no edema  LABS:  PULMONARY  Recent Labs Lab 01/11/15 0355 01/11/15 1625 01/12/15 1230  PHART 7.406 7.327* 7.380  PCO2ART 47.6* 46.9* 41.3  PO2ART 54.2* 64.9* 73.1*  HCO3 29.2* 23.8 23.9  TCO2 25.5 22.0 21.2  O2SAT 85.1 88.8 93.8    CBC  Recent Labs Lab 01/11/15 1630 01/12/15 0622 01/13/15 0418  HGB 12.0* 12.0* 11.4*  HCT 37.8* 38.1* 36.6*  WBC 3.6* 11.1* 9.1  PLT 136* 171 155    COAGULATION  Recent Labs Lab 01/11/15 0325 01/11/15 1205 01/11/15 1630 01/12/15 0622  INR 2.14* 1.53* 1.63* 1.57*    CARDIAC    Recent Labs Lab 01/11/15 1630 01/12/15 0622  TROPONINI <  0.03 0.04*   No results for input(s): PROBNP in the last 168 hours.   CHEMISTRY  Recent Labs Lab 01/11/15 0325 01/11/15 1630 01/12/15 0622 01/12/15 2155 01/13/15 0418  NA 139 140 139 142 142  K 4.4 4.0 4.1 4.2 3.7  CL 96* 105 101 111 110  CO2 31 25 28 22 24   GLUCOSE 145* 129* 115* 97 101*  BUN 26* 31* 37* 36* 35*  CREATININE 2.44* 2.94* 3.10* 2.52* 2.22*  CALCIUM 9.2 7.3* 7.0* 7.2* 7.4*  MG  --   --  1.8  --  1.8  PHOS  --   --  4.6  --  3.5   Estimated Creatinine  Clearance: 40.2 mL/min (by C-G formula based on Cr of 2.22).   LIVER  Recent Labs Lab 01/11/15 0325 01/11/15 1205 01/11/15 1630 01/12/15 0622 01/13/15 0418  AST 17  --   --  71* 85*  ALT 11*  --   --  16* 19  ALKPHOS 63  --   --  42 42  BILITOT 1.6*  --   --  1.8* 2.1*  PROT 8.4*  --   --  6.8 6.1*  ALBUMIN 4.1  --   --  3.0* 2.6*  INR 2.14* 1.53* 1.63* 1.57*  --      INFECTIOUS  Recent Labs Lab 01/11/15 0325 01/11/15 1630 01/12/15 0622 01/13/15 0418  LATICACIDVEN 1.7  --   --   --   PROCALCITON  --  1.09 5.08 3.83     ENDOCRINE CBG (last 3)  No results for input(s): GLUCAP in the last 72 hours.       IMAGING x48h  - image(s) personally visualized  -   highlighted in bold Dg Chest Port 1 View  01/13/2015   CLINICAL DATA:  Postop from hernia repair and small bowel resection. Acute on chronic respiratory failure with hypoxia. COPD exacerbation. On ventilator.  EXAM: PORTABLE CHEST - 1 VIEW  COMPARISON:  01/12/2015  FINDINGS: Endotracheal tube and nasogastric tube remain in place. Cardiomegaly and diffuse pulmonary interstitial prominence are stable. Atelectasis or infiltrate in the left retrocardiac lung base shows no significant change. No pneumothorax visualized.  IMPRESSION: No significant change in left retrocardiac atelectasis versus infiltrate.  Stable cardiomegaly and diffuse interstitial prominence.   Electronically Signed   By: Earle Gell M.D.   On: 01/13/2015 07:35   Dg Chest Port 1 View  01/12/2015   CLINICAL DATA:  Acute on chronic respiratory failure with hypoxia. On ventilator. COPD. Pulmonary embolism. Acute on chronic kidney injury.  EXAM: PORTABLE CHEST - 1 VIEW  COMPARISON:  01/11/2015  FINDINGS: Cardiomegaly and ectasia of the thoracic aorta remains stable allowing for differences in patient positioning. Support lines and tubes in appropriate position. No pneumothorax visualized.  There is persistent opacity in the left retrocardiac lung base, which  may be due to atelectasis or infiltrate. Right lung remains grossly clear.  IMPRESSION: Stable low lung volumes with left retrocardiac atelectasis versus infiltrate.  Stable cardiomegaly and ectatic thoracic aorta.   Electronically Signed   By: Earle Gell M.D.   On: 01/12/2015 09:39   Dg Chest Port 1 View  01/11/2015   CLINICAL DATA:  Acute and chronic respiratory failure with hypoxia. COPD. Endotracheal tube placement.  EXAM: PORTABLE CHEST - 1 VIEW  COMPARISON:  01/10/2015  FINDINGS: Endotracheal tube tip is seen approximately 5 cm above carina. Nasogastric tube is seen extending into the stomach.  Low lung volumes are noted. There is  bibasilar atelectasis which is increased since previous study. Mild cardiomegaly remains stable.  IMPRESSION: The endotracheal tube and nasogastric tube in appropriate position.  Low lung volumes with bibasilar atelectasis.   Electronically Signed   By: Earle Gell M.D.   On: 01/11/2015 16:30          ASSESSMENT / PLAN:  PULMONARY A: #Baseline COPD NOS - has home o2 but does not use H/O PE - doubt recurrence as was on full anticoagulation  #Now  - post op acute resp failure with hypoxemia - vq mismatch - ready for extubation 01/13/2015   P:   Extubate 01/13/2015  Might need o2 Pulm toilet Avoid post extubation bipap if possible due to p-lap Nebulized steroids Nebulized bronchodilators   CARDIOVASCULAR A:  H/O PAF, s/p ablation Post op hypotension Likely hypovolemic    - ongoing neosynephrine  need but improved with fluids  P:  Fluid bolus and then 50cc/h Phenylephrine as needed to maintain MAP > 60 mmHg (weaning off) Consider CVL if hypotension persists or requires high dose of vasopressors  RENAL A:   #Baseline CKD - last Cr 1.55 11/10/12  #current  - In ATN v severe pre-renal; improved 01/13/2015. Also mild hypomag < 2gm%   P:   Volume resus fluid bolus and 50cc./h Monitor BMET  Monitor I/Os Correct electrolytes as  indicated Maintenance IVFs ordered  GASTROINTESTINAL A:   SBO due to incarcerated ventral hernia Post laparotomy 7/08 P:   SUP: IV PPI Decisions re: timing and route of nutrition per CCS  HEMATOLOGIC A:   Mild anemia without overt bleeding Mild thrombocytopenia Chronic warfarin prior to admission P:  DVT px: SCDs Monitor CBC intermittently Transfuse per usual ICU guidelines Resume full anticoagulation when OK with CCS  INFECTIOUS A:   SIRS, concern for sepsis/peritonitis P:   Micro and abx as above PCT algorithm  ENDOCRINE A:   Mild hyperglycemia without prior dx of DM P:   Monitor glu on chem panels Consider SSI if glu > 180  NEUROLOGIC A:  Post op pain ICU associated discomfort  - appears well pain controlled with occ fent prn  P:   RASS goal: o PRN fentanyl; reduce dose  FAMILY 01/11/15: PCCM MD Discussed with Dr Dalbert Batman of [place]. On 01/13/2015 -  daughers x 2 up[dated. PLAN EXTUBATION      Dr. Brand Males, M.D., [facility].C.P Pulmonary and Critical Care Medicine Staff Physician [facility] Pager: [phone], If no answer or between  15:00h - 7:00h: call [phone]  01/13/2015 10:04 AM

## 2015-01-13 NOTE — Progress Notes (Signed)
[facility]  [address] [place], [place] [address] Phone: [phone] FAX: [place] [phone] 1936/06/21   Problem List:   Principal Problem:   Incisional hernia with gangrene and obstruction s/p exlap/Sb resection & repair 01/11/2015 Active Problems:   Morbid obesity   Essential hypertension, benign   COPD exacerbation   Acute and chronic respiratory failure with hypoxia   Acute-on-chronic kidney injury   Atrial fibrillation   Pulmonary embolism   Chronic anticoagulation   Shock circulatory   Acute renal failure   Acute respiratory failure with hypoxia   Acute post-operative pain   2 Days Post-Op  Date of Surgery: 01/11/2015  Pre op Diagnosis: Incarcerated incisional hernia with small bowel obstruction  Post op Diagnosis: Strangulated incisional hernia with small bowel obstruction, 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 This was probably 18-20 inches in length. After resecting this and performing an anastomosis there was a leak at the staple line not chose to resect the anastomosis and created an entirely new anastomosis, being concerned about the integrity of the bowel. The majority of the omentum was caught in the hernia and was necrotic and had to be resected. The rest of the small bowel and transverse colon looked normal. At the end of the case he seemed to be bleeding a little bit from most of the needle punctures and cut surfaces and so we plan to give further fresh frozen plasma. Dr. Eliseo Squires of the anesthesia department did not think he could be extubated and he was  taken to the ICU on the ventilator.  Surgeon: Edsel Petrin. Dalbert Batman, M.D., FACS  Assessment  Improving but guarded  Plan:  -IV bolus PRN -ARF improving a little w IVF & Cr lower -IV ABx x 5 days for abdomen (no purulence but necrotic omentum -wounc vac q MWF -vent per pulmon/CCM - help appreciated.  Weaning trial today -LFTs elevated mildly - follow ?propofol -VTE prophylaxis- SCDs, etc -mobilize as tolerated to help recovery  Adin Hector, M.D., F.A.C.S. Gastrointestinal and Minimally Invasive [facility] [address] [place], [place] [address] [phone] Main / Paging   01/13/2015  Subjective:  Intubated.  Weaning O2.   Much more alert today Soft BP - better w more IVF.  Weaning Neo RN Ruby at bedside RT at bedside   Objective:  Vital signs:  Filed Vitals:   01/13/15 0500 01/13/15 0600 01/13/15 0700 01/13/15 0809  BP:      Pulse: 63 60 63   Temp:      TempSrc:      Resp: _0 Height:      Weight:      SpO2: 94% 95% 96% 96%    Last BM Date: 01/10/15  Intake/Output   Yesterday:  07/09 0701 - 07/10 0700 In: 7433.4 [I.V.:5245.9; IV Piggyback:2187.5] Out: 4920 [Urine:2375; Emesis/NG output:950] This shift:  Total I/O In: 100 [I.V.:100] Out: -   Bowel function:  Flatus: n  BM: n  Drain: mod thick NGT output.  Flushes well  Physical Exam:  General: Pt intubated but alert & oriented.  Follows commands, writing notes. Eyes: PERRL, normal EOM.  Sclera clear.  No icterus Neuro: CN  II-XII intact w/o focal sensory/motor deficits. Lymph: No head/neck/groin lymphadenopathy Psych:  No delerium/psychosis/paranoia HENT: Normocephalic, Mucus membranes moist.  No thrush.  ETT & NGT in place Neck: Supple, No tracheal deviation Chest:  No chest wall pain w good excursion CV:  Pulses intact.  Regular rhythm MS: Normal AROM mjr joints.  No obvious deformity Abdomen: Soft.  Superobese w  large panniculus.  Nondistended.   Wound vac in place.  No evidence of peritonitis.  No incarcerated hernias. Ext:  SCDs BLE.  No mjr edema.  No cyanosis Skin: No petechiae / purpura  Results:   Labs: Results for orders placed or performed during the hospital encounter of 01/11/15 (from the past 48 hour(s))  Protime-INR     Status: Abnormal   Collection Time: 01/11/15 12:05 PM  Result Value Ref Range   Prothrombin Time 18.5 (H) 11.6 - 15.2 seconds   INR 1.53 (H) 0.00 - 1.49  Anaerobic culture     Status: None (Preliminary result)   Collection Time: 01/11/15  1:41 PM  Result Value Ref Range   Specimen Description WOUND INCARCERATED VENTRAL HERNIA    Special Requests PATIENT ON FOLLOWING VANCOMYCIN AND ZOSYN    Gram Stain      RARE WBC PRESENT, PREDOMINANTLY MONONUCLEAR NO SQUAMOUS EPITHELIAL CELLS SEEN NO ORGANISMS SEEN Performed at [facility]    Culture      NO ANAEROBES ISOLATED; CULTURE IN PROGRESS FOR 5 DAYS Performed at [facility]    Report Status PENDING   Wound culture     Status: None (Preliminary result)   Collection Time: 01/11/15  1:41 PM  Result Value Ref Range   Specimen Description WOUND INCARCERATED VENTRAL HERNIA    Special Requests PATIENT ON FOLLOWING VANCOMYCIN AND ZOSYN    Gram Stain      RARE WBC PRESENT, PREDOMINANTLY MONONUCLEAR NO SQUAMOUS EPITHELIAL CELLS SEEN NO ORGANISMS SEEN Performed at [facility]    Culture      NO GROWTH 2 DAYS Performed at [facility]    Report Status PENDING   Prepare fresh frozen plasma     Status: None (Preliminary result)   Collection Time: 01/11/15  2:57 PM  Result Value Ref Range   Unit Number [id number]    Blood Component Type THAWED PLASMA    Unit division 00    Status of Unit ALLOCATED    Transfusion Status OK TO TRANSFUSE   Blood gas, arterial     Status: Abnormal   Collection Time: 01/11/15  4:25 PM  Result Value Ref Range   FIO2 0.80 %   Delivery systems  VENTILATOR    Mode PRESSURE REGULATED VOLUME CONTROL    VT 620 mL   Rate 18 resp/min   Peep/cpap 5.0 cm H20   pH, Arterial 7.327 (L) 7.350 - 7.450   pCO2 arterial 46.9 (H) 35.0 - 45.0 mmHg   pO2, Arterial 64.9 (L) 80.0 - 100.0 mmHg   Bicarbonate 23.8 20.0 - 24.0 mEq/L   TCO2 22.0 0 - 100 mmol/L   Acid-base deficit 1.8 0.0 - 2.0 mmol/L   O2 Saturation 88.8 %   Patient temperature 98.6    Collection site A-LINE    Drawn by [id number]    Sample type ARTERIAL DRAW    Allens test (pass/fail) PASS PASS  CBC     Status: Abnormal   Collection Time: 01/11/15  4:30 PM  Result Value Ref Range   WBC 3.6 (L) 4.0 - 10.5 K/uL  RBC 4.20 (L) 4.22 - 5.81 MIL/uL   Hemoglobin 12.0 (L) 13.0 - 17.0 g/dL   HCT 37.8 (L) 39.0 - 52.0 %   MCV 90.0 78.0 - 100.0 fL   MCH 28.6 26.0 - 34.0 pg   MCHC 31.7 30.0 - 36.0 g/dL   RDW 15.0 11.5 - 15.5 %   Platelets 136 (L) 150 - 400 K/uL  Basic metabolic panel     Status: Abnormal   Collection Time: 01/11/15  4:30 PM  Result Value Ref Range   Sodium 140 135 - 145 mmol/L   Potassium 4.0 3.5 - 5.1 mmol/L   Chloride 105 101 - 111 mmol/L   CO2 25 22 - 32 mmol/L   Glucose, Bld 129 (H) 65 - 99 mg/dL   BUN 31 (H) 6 - 20 mg/dL   Creatinine, Ser 2.94 (H) 0.61 - 1.24 mg/dL   Calcium 7.3 (L) 8.9 - 10.3 mg/dL   GFR calc non Af Amer 19 (L) >60 mL/min   GFR calc Af Amer 22 (L) >60 mL/min    Comment: (NOTE) The eGFR has been calculated using the CKD EPI equation. This calculation has not been validated in all clinical situations. eGFR's persistently <60 mL/min signify possible Chronic Kidney Disease.    Anion gap 10 5 - 15  Protime-INR     Status: Abnormal   Collection Time: 01/11/15  4:30 PM  Result Value Ref Range   Prothrombin Time 19.4 (H) 11.6 - 15.2 seconds   INR 1.63 (H) 0.00 - 1.49  Procalcitonin - Baseline     Status: None   Collection Time: 01/11/15  4:30 PM  Result Value Ref Range   Procalcitonin 1.09 ng/mL    Comment:        Interpretation: PCT > 0.5  ng/mL and <= 2 ng/mL: Systemic infection (sepsis) is possible, but other conditions are known to elevate PCT as well. (NOTE)         ICU PCT Algorithm               Non ICU PCT Algorithm    ----------------------------     ------------------------------         PCT < 0.25 ng/mL                 PCT < 0.1 ng/mL     Stopping of antibiotics            Stopping of antibiotics       strongly encouraged.               strongly encouraged.    ----------------------------     ------------------------------       PCT level decrease by               PCT < 0.25 ng/mL       >= 80% from peak PCT       OR PCT 0.25 - 0.5 ng/mL          Stopping of antibiotics                                             encouraged.     Stopping of antibiotics           encouraged.    ----------------------------     ------------------------------       PCT level decrease by  PCT >= 0.25 ng/mL       < 80% from peak PCT        AND PCT >= 0.5 ng/mL             Continuing antibiotics                                              encouraged.       Continuing antibiotics            encouraged.    ----------------------------     ------------------------------     PCT level increase compared          PCT > 0.5 ng/mL         with peak PCT AND          PCT >= 0.5 ng/mL             Escalation of antibiotics                                          strongly encouraged.      Escalation of antibiotics        strongly encouraged.   Troponin I     Status: None   Collection Time: 01/11/15  4:30 PM  Result Value Ref Range   Troponin I <0.03 <0.031 ng/mL    Comment:        NO INDICATION OF MYOCARDIAL INJURY.   Comprehensive metabolic panel     Status: Abnormal   Collection Time: 01/12/15  6:22 AM  Result Value Ref Range   Sodium 139 135 - 145 mmol/L   Potassium 4.1 3.5 - 5.1 mmol/L   Chloride 101 101 - 111 mmol/L   CO2 28 22 - 32 mmol/L   Glucose, Bld 115 (H) 65 - 99 mg/dL   BUN 37 (H) 6 - 20 mg/dL    Creatinine, Ser 3.10 (H) 0.61 - 1.24 mg/dL   Calcium 7.0 (L) 8.9 - 10.3 mg/dL   Total Protein 6.8 6.5 - 8.1 g/dL   Albumin 3.0 (L) 3.5 - 5.0 g/dL   AST 71 (H) 15 - 41 U/L   ALT 16 (L) 17 - 63 U/L   Alkaline Phosphatase 42 38 - 126 U/L   Total Bilirubin 1.8 (H) 0.3 - 1.2 mg/dL   GFR calc non Af Amer 18 (L) >60 mL/min   GFR calc Af Amer 21 (L) >60 mL/min    Comment: (NOTE) The eGFR has been calculated using the CKD EPI equation. This calculation has not been validated in all clinical situations. eGFR's persistently <60 mL/min signify possible Chronic Kidney Disease.    Anion gap 10 5 - 15  Protime-INR     Status: Abnormal   Collection Time: 01/12/15  6:22 AM  Result Value Ref Range   Prothrombin Time 18.8 (H) 11.6 - 15.2 seconds   INR 1.57 (H) 0.00 - 1.49  CBC     Status: Abnormal   Collection Time: 01/12/15  6:22 AM  Result Value Ref Range   WBC 11.1 (H) 4.0 - 10.5 K/uL   RBC 4.15 (L) 4.22 - 5.81 MIL/uL   Hemoglobin 12.0 (L) 13.0 - 17.0 g/dL   HCT 38.1 (L) 39.0 - 52.0 %   MCV 91.8 78.0 - 100.0  fL   MCH 28.9 26.0 - 34.0 pg   MCHC 31.5 30.0 - 36.0 g/dL   RDW 15.4 11.5 - 15.5 %   Platelets 171 150 - 400 K/uL  Phosphorus     Status: None   Collection Time: 01/12/15  6:22 AM  Result Value Ref Range   Phosphorus 4.6 2.5 - 4.6 mg/dL  Magnesium     Status: None   Collection Time: 01/12/15  6:22 AM  Result Value Ref Range   Magnesium 1.8 1.7 - 2.4 mg/dL  Procalcitonin     Status: None   Collection Time: 01/12/15  6:22 AM  Result Value Ref Range   Procalcitonin 5.08 ng/mL    Comment:        Interpretation: PCT > 2 ng/mL: Systemic infection (sepsis) is likely, unless other causes are known. (NOTE)         ICU PCT Algorithm               Non ICU PCT Algorithm    ----------------------------     ------------------------------         PCT < 0.25 ng/mL                 PCT < 0.1 ng/mL     Stopping of antibiotics            Stopping of antibiotics       strongly encouraged.                strongly encouraged.    ----------------------------     ------------------------------       PCT level decrease by               PCT < 0.25 ng/mL       >= 80% from peak PCT       OR PCT 0.25 - 0.5 ng/mL          Stopping of antibiotics                                             encouraged.     Stopping of antibiotics           encouraged.    ----------------------------     ------------------------------       PCT level decrease by              PCT >= 0.25 ng/mL       < 80% from peak PCT        AND PCT >= 0.5 ng/mL            Continuing antibiotics                                               encouraged.       Continuing antibiotics            encouraged.    ----------------------------     ------------------------------     PCT level increase compared          PCT > 0.5 ng/mL         with peak PCT AND          PCT >= 0.5 ng/mL  Escalation of antibiotics                                          strongly encouraged.      Escalation of antibiotics        strongly encouraged.   Troponin I (q 6hr x 3)     Status: Abnormal   Collection Time: 01/12/15  6:22 AM  Result Value Ref Range   Troponin I 0.04 (H) <0.031 ng/mL    Comment:        PERSISTENTLY INCREASED TROPONIN VALUES IN THE RANGE OF 0.04-0.49 ng/mL CAN BE SEEN IN:       -UNSTABLE ANGINA       -CONGESTIVE HEART FAILURE       -MYOCARDITIS       -CHEST TRAUMA       -ARRYHTHMIAS       -LATE PRESENTING MYOCARDIAL INFARCTION       -COPD   CLINICAL FOLLOW-UP RECOMMENDED.   Blood gas, arterial     Status: Abnormal   Collection Time: 01/12/15 12:30 PM  Result Value Ref Range   FIO2 0.50 %   Delivery systems VENTILATOR    Mode PRESSURE REGULATED VOLUME CONTROL    VT 620 mL   Rate 16 resp/min   Peep/cpap 5.0 cm H20   pH, Arterial 7.380 7.350 - 7.450   pCO2 arterial 41.3 35.0 - 45.0 mmHg   pO2, Arterial 73.1 (L) 80.0 - 100.0 mmHg   Bicarbonate 23.9 20.0 - 24.0 mEq/L   TCO2 21.2 0 - 100 mmol/L    Acid-base deficit 0.7 0.0 - 2.0 mmol/L   O2 Saturation 93.8 %   Patient temperature 98.6    Collection site A-LINE    Drawn by [id number]    Sample type ARTERIAL DRAW   Basic metabolic panel     Status: Abnormal   Collection Time: 01/12/15  9:55 PM  Result Value Ref Range   Sodium 142 135 - 145 mmol/L   Potassium 4.2 3.5 - 5.1 mmol/L   Chloride 111 101 - 111 mmol/L   CO2 22 22 - 32 mmol/L   Glucose, Bld 97 65 - 99 mg/dL   BUN 36 (H) 6 - 20 mg/dL   Creatinine, Ser 2.52 (H) 0.61 - 1.24 mg/dL   Calcium 7.2 (L) 8.9 - 10.3 mg/dL   GFR calc non Af Amer 23 (L) >60 mL/min   GFR calc Af Amer 27 (L) >60 mL/min    Comment: (NOTE) The eGFR has been calculated using the CKD EPI equation. This calculation has not been validated in all clinical situations. eGFR's persistently <60 mL/min signify possible Chronic Kidney Disease.    Anion gap 9 5 - 15  Procalcitonin     Status: None   Collection Time: 01/13/15  4:18 AM  Result Value Ref Range   Procalcitonin 3.83 ng/mL    Comment:        Interpretation: PCT > 2 ng/mL: Systemic infection (sepsis) is likely, unless other causes are known. (NOTE)         ICU PCT Algorithm               Non ICU PCT Algorithm    ----------------------------     ------------------------------         PCT < 0.25 ng/mL  PCT < 0.1 ng/mL     Stopping of antibiotics            Stopping of antibiotics       strongly encouraged.               strongly encouraged.    ----------------------------     ------------------------------       PCT level decrease by               PCT < 0.25 ng/mL       >= 80% from peak PCT       OR PCT 0.25 - 0.5 ng/mL          Stopping of antibiotics                                             encouraged.     Stopping of antibiotics           encouraged.    ----------------------------     ------------------------------       PCT level decrease by              PCT >= 0.25 ng/mL       < 80% from peak PCT        AND PCT >= 0.5  ng/mL            Continuing antibiotics                                               encouraged.       Continuing antibiotics            encouraged.    ----------------------------     ------------------------------     PCT level increase compared          PCT > 0.5 ng/mL         with peak PCT AND          PCT >= 0.5 ng/mL             Escalation of antibiotics                                          strongly encouraged.      Escalation of antibiotics        strongly encouraged.   Comprehensive metabolic panel     Status: Abnormal   Collection Time: 01/13/15  4:18 AM  Result Value Ref Range   Sodium 142 135 - 145 mmol/L   Potassium 3.7 3.5 - 5.1 mmol/L   Chloride 110 101 - 111 mmol/L   CO2 24 22 - 32 mmol/L   Glucose, Bld 101 (H) 65 - 99 mg/dL   BUN 35 (H) 6 - 20 mg/dL   Creatinine, Ser 2.22 (H) 0.61 - 1.24 mg/dL   Calcium 7.4 (L) 8.9 - 10.3 mg/dL   Total Protein 6.1 (L) 6.5 - 8.1 g/dL   Albumin 2.6 (L) 3.5 - 5.0 g/dL   AST 85 (H) 15 - 41 U/L   ALT 19 17 - 63 U/L   Alkaline Phosphatase 42 38 - 126 U/L   Total Bilirubin 2.1 (H) 0.3 -  1.2 mg/dL   GFR calc non Af Amer 27 (L) >60 mL/min   GFR calc Af Amer 31 (L) >60 mL/min    Comment: (NOTE) The eGFR has been calculated using the CKD EPI equation. This calculation has not been validated in all clinical situations. eGFR's persistently <60 mL/min signify possible Chronic Kidney Disease.    Anion gap 8 5 - 15  Phosphorus     Status: None   Collection Time: 01/13/15  4:18 AM  Result Value Ref Range   Phosphorus 3.5 2.5 - 4.6 mg/dL  Magnesium     Status: None   Collection Time: 01/13/15  4:18 AM  Result Value Ref Range   Magnesium 1.8 1.7 - 2.4 mg/dL  CBC with Differential/Platelet     Status: Abnormal   Collection Time: 01/13/15  4:18 AM  Result Value Ref Range   WBC 9.1 4.0 - 10.5 K/uL   RBC 3.99 (L) 4.22 - 5.81 MIL/uL   Hemoglobin 11.4 (L) 13.0 - 17.0 g/dL   HCT 36.6 (L) 39.0 - 52.0 %   MCV 91.7 78.0 - 100.0 fL   MCH  28.6 26.0 - 34.0 pg   MCHC 31.1 30.0 - 36.0 g/dL   RDW 15.3 11.5 - 15.5 %   Platelets 155 150 - 400 K/uL   Neutrophils Relative % 76 43 - 77 %   Neutro Abs 6.8 1.7 - 7.7 K/uL   Lymphocytes Relative 14 12 - 46 %   Lymphs Abs 1.3 0.7 - 4.0 K/uL   Monocytes Relative 9 3 - 12 %   Monocytes Absolute 0.9 0.1 - 1.0 K/uL   Eosinophils Relative 1 0 - 5 %   Eosinophils Absolute 0.1 0.0 - 0.7 K/uL   Basophils Relative 0 0 - 1 %   Basophils Absolute 0.0 0.0 - 0.1 K/uL    Imaging / Studies: Dg Chest Port 1 View  01/13/2015   CLINICAL DATA:  Postop from hernia repair and small bowel resection. Acute on chronic respiratory failure with hypoxia. COPD exacerbation. On ventilator.  EXAM: PORTABLE CHEST - 1 VIEW  COMPARISON:  01/12/2015  FINDINGS: Endotracheal tube and nasogastric tube remain in place. Cardiomegaly and diffuse pulmonary interstitial prominence are stable. Atelectasis or infiltrate in the left retrocardiac lung base shows no significant change. No pneumothorax visualized.  IMPRESSION: No significant change in left retrocardiac atelectasis versus infiltrate.  Stable cardiomegaly and diffuse interstitial prominence.   Electronically Signed   By: Earle Gell M.D.   On: 01/13/2015 07:35   Dg Chest Port 1 View  01/12/2015   CLINICAL DATA:  Acute on chronic respiratory failure with hypoxia. On ventilator. COPD. Pulmonary embolism. Acute on chronic kidney injury.  EXAM: PORTABLE CHEST - 1 VIEW  COMPARISON:  01/11/2015  FINDINGS: Cardiomegaly and ectasia of the thoracic aorta remains stable allowing for differences in patient positioning. Support lines and tubes in appropriate position. No pneumothorax visualized.  There is persistent opacity in the left retrocardiac lung base, which may be due to atelectasis or infiltrate. Right lung remains grossly clear.  IMPRESSION: Stable low lung volumes with left retrocardiac atelectasis versus infiltrate.  Stable cardiomegaly and ectatic thoracic aorta.    Electronically Signed   By: Earle Gell M.D.   On: 01/12/2015 09:39   Dg Chest Port 1 View  01/11/2015   CLINICAL DATA:  Acute and chronic respiratory failure with hypoxia. COPD. Endotracheal tube placement.  EXAM: PORTABLE CHEST - 1 VIEW  COMPARISON:  01/10/2015  FINDINGS: Endotracheal tube tip  is seen approximately 5 cm above carina. Nasogastric tube is seen extending into the stomach.  Low lung volumes are noted. There is bibasilar atelectasis which is increased since previous study. Mild cardiomegaly remains stable.  IMPRESSION: The endotracheal tube and nasogastric tube in appropriate position.  Low lung volumes with bibasilar atelectasis.   Electronically Signed   By: Earle Gell M.D.   On: 01/11/2015 16:30    Medications / Allergies: per chart  Antibiotics: Anti-infectives    Start     Dose/Rate Route Frequency Ordered Stop   01/11/15 0700  vancomycin (VANCOCIN) 1,750 mg in sodium chloride 0.9 % 500 mL IVPB     1,750 mg 250 mL/hr over 120 Minutes Intravenous Daily 01/11/15 0648     01/11/15 0330  piperacillin-tazobactam (ZOSYN) IVPB 3.375 g     3.375 g 12.5 mL/hr over 240 Minutes Intravenous 3 times per day 01/11/15 0321          Note: Portions of this report may have been transcribed using voice recognition software. Every effort was made to ensure accuracy; however, inadvertent computerized transcription errors may be present.   Any transcriptional errors that result from this process are unintentional.     Adin Hector, M.D., F.A.C.S. Gastrointestinal and Minimally Invasive Surgery [facility] [address], [place], [place] [address] [phone] Main / Paging   01/13/2015  CARE TEAM:  PCP: No primary care provider on file.  Outpatient Care Team: No care team member to display  Inpatient Treatment Team: Treatment Team: Attending Provider: Brand Males, MD; Registered Nurse: Mortimer Fries, RN; Consulting Physician:  Nolon Nations, MD; Rounding Team: Md Pccm, MD; Registered Nurse: Jolene Provost Main, RN; Registered Nurse: Carl Best, RN; Technician: Abe People, NT; Registered Nurse: Meredith Mody, RN; Respiratory Therapist: Nelly Laurence, RRT

## 2015-01-13 NOTE — Progress Notes (Signed)
ANTIBIOTIC CONSULT NOTE - follow up  Pharmacy Consult for zosyn/vancomycin Indication: intraabdominal infection/sepsis  Allergies  Allergen Reactions  . Ciprofloxacin Diarrhea  . Morphine And Related     HALLUCINATIONS  . Zithromax [Azithromycin] Diarrhea    Patient Measurements: Height: 6\' 1"  (185.4 cm) Weight: 298 lb 11.6 oz (135.5 kg) IBW/kg (Calculated) : 79.9 Adjusted Body Weight:   Vital Signs: Temp: 98.6 F (37 C) (07/10 1200) Temp Source: Axillary (07/10 1200) BP: 96/44 mmHg (07/10 0902) Pulse Rate: 75 (07/10 1200) Intake/Output from previous day: 07/09 0701 - 07/10 0700 In: 7433.4 [I.V.:5245.9; IV Piggyback:2187.5] Out: 8921 [Urine:2375; Emesis/NG output:950] Intake/Output from this shift: Total I/O In: 433.7 [I.V.:433.7] Out: 1250 [Urine:850; Emesis/NG output:400]  Labs:  Recent Labs  01/11/15 1630 01/12/15 0622 01/12/15 2155 01/13/15 0418  WBC 3.6* 11.1*  --  9.1  HGB 12.0* 12.0*  --  11.4*  PLT 136* 171  --  155  CREATININE 2.94* 3.10* 2.52* 2.22*   Estimated Creatinine Clearance: 40.2 mL/min (by C-G formula based on Cr of 2.22). No results for input(s): VANCOTROUGH, VANCOPEAK, VANCORANDOM, GENTTROUGH, GENTPEAK, GENTRANDOM, TOBRATROUGH, TOBRAPEAK, TOBRARND, AMIKACINPEAK, AMIKACINTROU, AMIKACIN in the last 72 hours.   Microbiology: Recent Results (from the past 720 hour(s))  Culture, blood (routine x 2)     Status: None (Preliminary result)   Collection Time: 01/11/15  6:50 AM  Result Value Ref Range Status   Specimen Description BLOOD RIGHT HAND  Final   Special Requests BOTTLES DRAWN AEROBIC ONLY 3CC  Final   Culture   Final    NO GROWTH 1 DAY Performed at [facility]    Report Status PENDING  Incomplete  Culture, blood (routine x 2)     Status: None (Preliminary result)   Collection Time: 01/11/15  6:50 AM  Result Value Ref Range Status   Specimen Description BLOOD RIGHT ARM  Final   Special Requests BOTTLES DRAWN AEROBIC AND  ANAEROBIC 5CC  Final   Culture   Final    NO GROWTH 1 DAY Performed at [facility]    Report Status PENDING  Incomplete  Surgical pcr screen     Status: None   Collection Time: 01/11/15  6:59 AM  Result Value Ref Range Status   MRSA, PCR NEGATIVE NEGATIVE Final   Staphylococcus aureus NEGATIVE NEGATIVE Final    Comment:        The Xpert SA Assay (FDA approved for NASAL specimens in patients over 9 years of age), is one component of a comprehensive surveillance program.  Test performance has been validated by [facility] for patients greater than or equal to 40 year old. It is not intended to diagnose infection nor to guide or monitor treatment.   Anaerobic culture     Status: None (Preliminary result)   Collection Time: 01/11/15  1:41 PM  Result Value Ref Range Status   Specimen Description WOUND INCARCERATED VENTRAL HERNIA  Final   Special Requests PATIENT ON FOLLOWING VANCOMYCIN AND ZOSYN  Final   Gram Stain   Final    RARE WBC PRESENT, PREDOMINANTLY MONONUCLEAR NO SQUAMOUS EPITHELIAL CELLS SEEN NO ORGANISMS SEEN Performed at [facility]    Culture   Final    NO ANAEROBES ISOLATED; CULTURE IN PROGRESS FOR 5 DAYS Performed at [facility]    Report Status PENDING  Incomplete  Wound culture     Status: None (Preliminary result)   Collection Time: 01/11/15  1:41 PM  Result Value Ref Range Status  Specimen Description WOUND INCARCERATED VENTRAL HERNIA  Final   Special Requests PATIENT ON FOLLOWING VANCOMYCIN AND ZOSYN  Final   Gram Stain   Final    RARE WBC PRESENT, PREDOMINANTLY MONONUCLEAR NO SQUAMOUS EPITHELIAL CELLS SEEN NO ORGANISMS SEEN Performed at [facility]    Culture   Final    NO GROWTH 2 DAYS Performed at [facility]    Report Status PENDING  Incomplete    Medical History: Past Medical History  Diagnosis Date  . Dysrhythmia     PAST HX OF HEART ABLATION FOR IRREGULAR HEART RATE --SUCCESSFUL-NO  FURTHER PROB WITH IRREGULAR HEART BEAT  . COPD (chronic obstructive pulmonary disease)     HX OF SMOKING FOR 60 YRS-PT SEES DR. Winona Legato IN [place]   . Shortness of breath     WITH ANY EXERTION-USES OXYGEN AS NEEDED  . Pneumonia     IN THE PAST - BUT NOT IN THE LAST YR  . Asthma   . Pulmonary embolus 2011 OR 2012    CHRONIC WARFARIN SINCE THE BLOOD CLOT  . Bilateral leg edema     WEARS SUPPORT HOSE  . History of kidney stones     SOME SMALL STONES AT PRESENT TIME- TAKES POTASSIUM CITRATE  . H/O hiatal hernia   . GERD (gastroesophageal reflux disease)   . Cancer     SKIN CANCERS  . Arthritis     ALL OVER  . Sleep apnea     USES BIPAP  SETTING 16 / 11 - PER PULMONOGIST'S OFFICE NOTE    Medications:  Anti-infectives    Start     Dose/Rate Route Frequency Ordered Stop   01/11/15 0700  vancomycin (VANCOCIN) 1,750 mg in sodium chloride 0.9 % 500 mL IVPB     1,750 mg 250 mL/hr over 120 Minutes Intravenous Daily 01/11/15 0648 01/16/15 0559   01/11/15 0330  piperacillin-tazobactam (ZOSYN) IVPB 3.375 g     3.375 g 12.5 mL/hr over 240 Minutes Intravenous 3 times per day 01/11/15 0321 01/16/15 0559     Assessment: 79yo male with COPD, hx of PE and afib, chronic warfarin use presents with SOB, cough, abdominal pain, and diarrhea last 3-4 days. Incarcerated hernia so plan for OR 7/8. Started on vanc and Zosyn on 7/8 per pharmacy  Tmax 101.3, WBC improving, SCr improving but CrCl N still 28 ml/min/1.42m2  Goal of Therapy:  Zosyn based on renal function  Vancomycin level 15-20  Plan:  1) Change vanc from 1750mg  q24 to q48 per dosing nomogram 2) Continue current Zosyn dosing   Adrian Saran, PharmD, BCPS Pager [phone] 01/13/2015 12:53 PM

## 2015-01-13 NOTE — Progress Notes (Signed)
Pt. Placed on 100%O2, suctioned trach and orally, cuff deflated and extubated to a 5lpm [place]. Vital signs stable. O2 sat 90%. Able to verbalize well.

## 2015-01-14 ENCOUNTER — Encounter (HOSPITAL_COMMUNITY): Payer: Self-pay | Admitting: General Surgery

## 2015-01-14 DIAGNOSIS — Z7901 Long term (current) use of anticoagulants: Secondary | ICD-10-CM

## 2015-01-14 LAB — CBC WITH DIFFERENTIAL/PLATELET
Basophils Absolute: 0 10*3/uL (ref 0.0–0.1)
Basophils Relative: 0 % (ref 0–1)
EOS ABS: 0.1 10*3/uL (ref 0.0–0.7)
Eosinophils Relative: 2 % (ref 0–5)
HCT: 32.4 % — ABNORMAL LOW (ref 39.0–52.0)
HEMOGLOBIN: 10 g/dL — AB (ref 13.0–17.0)
LYMPHS ABS: 0.8 10*3/uL (ref 0.7–4.0)
LYMPHS PCT: 11 % — AB (ref 12–46)
MCH: 28.3 pg (ref 26.0–34.0)
MCHC: 30.9 g/dL (ref 30.0–36.0)
MCV: 91.8 fL (ref 78.0–100.0)
MONOS PCT: 9 % (ref 3–12)
Monocytes Absolute: 0.6 10*3/uL (ref 0.1–1.0)
Neutro Abs: 5.5 10*3/uL (ref 1.7–7.7)
Neutrophils Relative %: 78 % — ABNORMAL HIGH (ref 43–77)
Platelets: 145 10*3/uL — ABNORMAL LOW (ref 150–400)
RBC: 3.53 MIL/uL — ABNORMAL LOW (ref 4.22–5.81)
RDW: 15.3 % (ref 11.5–15.5)
WBC: 7.1 10*3/uL (ref 4.0–10.5)

## 2015-01-14 LAB — MAGNESIUM: Magnesium: 2.3 mg/dL (ref 1.7–2.4)

## 2015-01-14 LAB — PHOSPHORUS: Phosphorus: 3.2 mg/dL (ref 2.5–4.6)

## 2015-01-14 LAB — WOUND CULTURE: Culture: NO GROWTH

## 2015-01-14 LAB — PREPARE FRESH FROZEN PLASMA: UNIT DIVISION: 0

## 2015-01-14 LAB — BASIC METABOLIC PANEL
Anion gap: 8 (ref 5–15)
BUN: 33 mg/dL — AB (ref 6–20)
CO2: 23 mmol/L (ref 22–32)
CREATININE: 1.81 mg/dL — AB (ref 0.61–1.24)
Calcium: 7.8 mg/dL — ABNORMAL LOW (ref 8.9–10.3)
Chloride: 111 mmol/L (ref 101–111)
GFR calc non Af Amer: 34 mL/min — ABNORMAL LOW (ref >60)
GFR, EST AFRICAN AMERICAN: 40 mL/min — AB (ref >60)
GLUCOSE: 97 mg/dL (ref 65–99)
Potassium: 3.7 mmol/L (ref 3.5–5.1)
Sodium: 142 mmol/L (ref 135–145)

## 2015-01-14 MED ORDER — IPRATROPIUM-ALBUTEROL 0.5-2.5 (3) MG/3ML IN SOLN: 3.0000 mL | RESPIRATORY_TRACT | Status: DC | PRN

## 2015-01-14 MED ORDER — QUETIAPINE FUMARATE 25 MG PO TABS
25.0000 mg | ORAL_TABLET | Freq: Every evening | ORAL | Status: DC | PRN
  Administered 2015-01-14 – 2015-01-16 (×3): 25 mg via ORAL
  Filled 2015-01-14 (×7): qty 1

## 2015-01-14 MED ORDER — ARFORMOTEROL TARTRATE 15 MCG/2ML IN NEBU
15.0000 ug | INHALATION_SOLUTION | Freq: Two times a day (BID) | RESPIRATORY_TRACT | Status: DC
  Administered 2015-01-14 – 2015-01-17 (×6): 15 ug via RESPIRATORY_TRACT
  Filled 2015-01-14 (×9): qty 2

## 2015-01-14 MED ORDER — BUDESONIDE 0.5 MG/2ML IN SUSP
0.5000 mg | Freq: Two times a day (BID) | RESPIRATORY_TRACT | Status: DC
  Administered 2015-01-14 – 2015-01-17 (×6): 0.5 mg via RESPIRATORY_TRACT
  Filled 2015-01-14 (×6): qty 2

## 2015-01-14 NOTE — Consult Note (Signed)
WOC wound consult note Reason for Consult: Change NPWT dressings Wound type:Surgical Pressure Ulcer POA: No Measurement: 16cm x 6cm x 7cm Wound bed: red, moist Drainage (amount, consistency, odor) serosanguinous Periwound:Intact Dressing procedure/placement/frequency: NPWT dressing and drape removed (2 pieces of black foam) and wound cleansed with NS.  Wound measurements taken, black foam trimmed to fit defect.  Two (2) pieces of black foam used to fill defect. Foam covered with drape, dressing attached to vacuum.  135mmHg continuous negative pressure achieved easily and tolerated well. Patient was premedicated prior to dressing change. Next dressing change due on Wednesday, 01/16/15. [place] nursing team will not follow, but will remain available to this patient, the nursing, surgical and medical teams.  Please re-consult if needed. Thanks, Maudie Flakes, MSN, RN, [place], [place], [place] [phone])

## 2015-01-14 NOTE — Progress Notes (Signed)
Patient ID: Gary Boyer, male   DOB: September 15, 1935, 79 y.o.   MRN: [id number]     [facility]      [address] [place], [place] [address]    Phone: [phone] FAX: [phone]     Subjective: 1846m of NGT output which is light brown.  +800 of ice chips/water.  No flatus.  No n/v.  VSS.  Afebrile.    Objective:  Vital signs:  Filed Vitals:   01/14/15 0400 01/14/15 0500 01/14/15 0530 01/14/15 0600  BP: 106/60   114/50  Pulse: 77 46 80 50  Temp: 98.5 F (36.9 C)     TempSrc: Oral     Resp: _0 Height:      Weight: 135.9 kg (299 lb 9.7 oz)     SpO2: 89% 88% 89% 93%    Last BM Date: 01/10/15  Intake/Output   Yesterday:  07/10 0701 - 07/11 0700 In: 2303.7 [P.O.:820; I.V.:1333.7; IV Piggyback:150] Out: 4160 [Urine:2310; Emesis/NG output:1850] This shift:    I/O last 3 completed shifts: In: 6476.5 [P.O.:820; I.V.:4406.5; IV Piggyback:1250] Out: 6210 [Urine:3660; Emesis/NG output:2550]    Physical Exam: General: Pt awake/alert/oriented x4 in no acute distress Abdomen: Soft.  Nondistended. Midline wound-vac in place with serosanguinous output.  Mildly tender at incisions only.  No evidence of peritonitis.  No incarcerated hernias.    Problem List:   Principal Problem:   Incisional hernia with gangrene and obstruction s/p exlap/Sb resection & repair 01/11/2015 Active Problems:   Morbid obesity   Essential hypertension, benign   COPD exacerbation   Acute and chronic respiratory failure with hypoxia   Acute-on-chronic kidney injury   Atrial fibrillation   Pulmonary embolism   Chronic anticoagulation   Shock circulatory   Acute renal failure   Acute respiratory failure with hypoxia   Acute post-operative pain   COPD (chronic obstructive pulmonary disease)    Results:   Labs: Results for orders placed or performed during the hospital encounter of 01/11/15 (from the past 48 hour(s))  Blood gas, arterial      Status: Abnormal   Collection Time: 01/12/15 12:30 PM  Result Value Ref Range   FIO2 0.50 %   Delivery systems VENTILATOR    Mode PRESSURE REGULATED VOLUME CONTROL    VT 620 mL   Rate 16 resp/min   Peep/cpap 5.0 cm H20   pH, Arterial 7.380 7.350 - 7.450   pCO2 arterial 41.3 35.0 - 45.0 mmHg   pO2, Arterial 73.1 (L) 80.0 - 100.0 mmHg   Bicarbonate 23.9 20.0 - 24.0 mEq/L   TCO2 21.2 0 - 100 mmol/L   Acid-base deficit 0.7 0.0 - 2.0 mmol/L   O2 Saturation 93.8 %   Patient temperature 98.6    Collection site A-LINE    Drawn by [id number]   Sample type ARTERIAL DRAW   Basic metabolic panel     Status: Abnormal   Collection Time: 01/12/15  9:55 PM  Result Value Ref Range   Sodium 142 135 - 145 mmol/L   Potassium 4.2 3.5 - 5.1 mmol/L   Chloride 111 101 - 111 mmol/L   CO2 22 22 - 32 mmol/L   Glucose, Bld 97 65 - 99 mg/dL   BUN 36 (H) 6 - 20 mg/dL   Creatinine, Ser 2.52 (H) 0.61 - 1.24 mg/dL   Calcium 7.2 (L) 8.9 - 10.3 mg/dL   GFR calc non Af Amer 23 (L) >60 mL/min  GFR calc Af Amer 27 (L) >60 mL/min    Comment: (NOTE) The eGFR has been calculated using the CKD EPI equation. This calculation has not been validated in all clinical situations. eGFR's persistently <60 mL/min signify possible Chronic Kidney Disease.    Anion gap 9 5 - 15  Prealbumin     Status: Abnormal   Collection Time: 01/13/15  4:18 AM  Result Value Ref Range   Prealbumin 8.3 (L) 18 - 38 mg/dL    Comment: Performed at [facility]  Procalcitonin     Status: None   Collection Time: 01/13/15  4:18 AM  Result Value Ref Range   Procalcitonin 3.83 ng/mL    Comment:        Interpretation: PCT > 2 ng/mL: Systemic infection (sepsis) is likely, unless other causes are known. (NOTE)         ICU PCT Algorithm               Non ICU PCT Algorithm    ----------------------------     ------------------------------         PCT < 0.25 ng/mL                 PCT < 0.1 ng/mL     Stopping of antibiotics             Stopping of antibiotics       strongly encouraged.               strongly encouraged.    ----------------------------     ------------------------------       PCT level decrease by               PCT < 0.25 ng/mL       >= 80% from peak PCT       OR PCT 0.25 - 0.5 ng/mL          Stopping of antibiotics                                             encouraged.     Stopping of antibiotics           encouraged.    ----------------------------     ------------------------------       PCT level decrease by              PCT >= 0.25 ng/mL       < 80% from peak PCT        AND PCT >= 0.5 ng/mL            Continuing antibiotics                                               encouraged.       Continuing antibiotics            encouraged.    ----------------------------     ------------------------------     PCT level increase compared          PCT > 0.5 ng/mL         with peak PCT AND          PCT >= 0.5 ng/mL  Escalation of antibiotics                                          strongly encouraged.      Escalation of antibiotics        strongly encouraged.   Comprehensive metabolic panel     Status: Abnormal   Collection Time: 01/13/15  4:18 AM  Result Value Ref Range   Sodium 142 135 - 145 mmol/L   Potassium 3.7 3.5 - 5.1 mmol/L   Chloride 110 101 - 111 mmol/L   CO2 24 22 - 32 mmol/L   Glucose, Bld 101 (H) 65 - 99 mg/dL   BUN 35 (H) 6 - 20 mg/dL   Creatinine, Ser 2.22 (H) 0.61 - 1.24 mg/dL   Calcium 7.4 (L) 8.9 - 10.3 mg/dL   Total Protein 6.1 (L) 6.5 - 8.1 g/dL   Albumin 2.6 (L) 3.5 - 5.0 g/dL   AST 85 (H) 15 - 41 U/L   ALT 19 17 - 63 U/L   Alkaline Phosphatase 42 38 - 126 U/L   Total Bilirubin 2.1 (H) 0.3 - 1.2 mg/dL   GFR calc non Af Amer 27 (L) >60 mL/min   GFR calc Af Amer 31 (L) >60 mL/min    Comment: (NOTE) The eGFR has been calculated using the CKD EPI equation. This calculation has not been validated in all clinical situations. eGFR's persistently <60 mL/min signify  possible Chronic Kidney Disease.    Anion gap 8 5 - 15  Phosphorus     Status: None   Collection Time: 01/13/15  4:18 AM  Result Value Ref Range   Phosphorus 3.5 2.5 - 4.6 mg/dL  Magnesium     Status: None   Collection Time: 01/13/15  4:18 AM  Result Value Ref Range   Magnesium 1.8 1.7 - 2.4 mg/dL  CBC with Differential/Platelet     Status: Abnormal   Collection Time: 01/13/15  4:18 AM  Result Value Ref Range   WBC 9.1 4.0 - 10.5 K/uL   RBC 3.99 (L) 4.22 - 5.81 MIL/uL   Hemoglobin 11.4 (L) 13.0 - 17.0 g/dL   HCT 36.6 (L) 39.0 - 52.0 %   MCV 91.7 78.0 - 100.0 fL   MCH 28.6 26.0 - 34.0 pg   MCHC 31.1 30.0 - 36.0 g/dL   RDW 15.3 11.5 - 15.5 %   Platelets 155 150 - 400 K/uL   Neutrophils Relative % 76 43 - 77 %   Neutro Abs 6.8 1.7 - 7.7 K/uL   Lymphocytes Relative 14 12 - 46 %   Lymphs Abs 1.3 0.7 - 4.0 K/uL   Monocytes Relative 9 3 - 12 %   Monocytes Absolute 0.9 0.1 - 1.0 K/uL   Eosinophils Relative 1 0 - 5 %   Eosinophils Absolute 0.1 0.0 - 0.7 K/uL   Basophils Relative 0 0 - 1 %   Basophils Absolute 0.0 0.0 - 0.1 K/uL  Basic metabolic panel     Status: Abnormal   Collection Time: 01/14/15  3:40 AM  Result Value Ref Range   Sodium 142 135 - 145 mmol/L   Potassium 3.7 3.5 - 5.1 mmol/L   Chloride 111 101 - 111 mmol/L   CO2 23 22 - 32 mmol/L   Glucose, Bld 97 65 - 99 mg/dL   BUN 33 (H) 6 - 20 mg/dL   Creatinine,  Ser 1.81 (H) 0.61 - 1.24 mg/dL   Calcium 7.8 (L) 8.9 - 10.3 mg/dL   GFR calc non Af Amer 34 (L) >60 mL/min   GFR calc Af Amer 40 (L) >60 mL/min    Comment: (NOTE) The eGFR has been calculated using the CKD EPI equation. This calculation has not been validated in all clinical situations. eGFR's persistently <60 mL/min signify possible Chronic Kidney Disease.    Anion gap 8 5 - 15  Phosphorus     Status: None   Collection Time: 01/14/15  3:40 AM  Result Value Ref Range   Phosphorus 3.2 2.5 - 4.6 mg/dL  Magnesium     Status: None   Collection Time:  01/14/15  3:40 AM  Result Value Ref Range   Magnesium 2.3 1.7 - 2.4 mg/dL  CBC with Differential/Platelet     Status: Abnormal   Collection Time: 01/14/15  3:40 AM  Result Value Ref Range   WBC 7.1 4.0 - 10.5 K/uL   RBC 3.53 (L) 4.22 - 5.81 MIL/uL   Hemoglobin 10.0 (L) 13.0 - 17.0 g/dL   HCT 32.4 (L) 39.0 - 52.0 %   MCV 91.8 78.0 - 100.0 fL   MCH 28.3 26.0 - 34.0 pg   MCHC 30.9 30.0 - 36.0 g/dL   RDW 15.3 11.5 - 15.5 %   Platelets 145 (L) 150 - 400 K/uL   Neutrophils Relative % 78 (H) 43 - 77 %   Neutro Abs 5.5 1.7 - 7.7 K/uL   Lymphocytes Relative 11 (L) 12 - 46 %   Lymphs Abs 0.8 0.7 - 4.0 K/uL   Monocytes Relative 9 3 - 12 %   Monocytes Absolute 0.6 0.1 - 1.0 K/uL   Eosinophils Relative 2 0 - 5 %   Eosinophils Absolute 0.1 0.0 - 0.7 K/uL   Basophils Relative 0 0 - 1 %   Basophils Absolute 0.0 0.0 - 0.1 K/uL    Imaging / Studies: Dg Chest Port 1 View  01/13/2015   CLINICAL DATA:  Postop from hernia repair and small bowel resection. Acute on chronic respiratory failure with hypoxia. COPD exacerbation. On ventilator.  EXAM: PORTABLE CHEST - 1 VIEW  COMPARISON:  01/12/2015  FINDINGS: Endotracheal tube and nasogastric tube remain in place. Cardiomegaly and diffuse pulmonary interstitial prominence are stable. Atelectasis or infiltrate in the left retrocardiac lung base shows no significant change. No pneumothorax visualized.  IMPRESSION: No significant change in left retrocardiac atelectasis versus infiltrate.  Stable cardiomegaly and diffuse interstitial prominence.   Electronically Signed   By: Earle Gell M.D.   On: 01/13/2015 07:35    Medications / Allergies:  Scheduled Meds: . antiseptic oral rinse  7 mL Mouth Rinse q12n4p  . budesonide (PULMICORT) nebulizer solution  0.25 mg Nebulization BID  . chlorhexidine  15 mL Mouth Rinse BID  . ipratropium-albuterol  3 mL Nebulization Q6H  . lip balm  1 application Topical BID  . pantoprazole (PROTONIX) IV  40 mg Intravenous Q12H  .  piperacillin-tazobactam (ZOSYN)  IV  3.375 g Intravenous 3 times per day  . [START ON 01/15/2015] vancomycin  1,750 mg Intravenous Q48H   Continuous Infusions: . sodium chloride 50 mL/hr at 01/13/15 1626  . phenylephrine (NEO-SYNEPHRINE) Adult infusion Stopped (01/13/15 1022)   PRN Meds:.alum & mag hydroxide-simeth, bisacodyl, fentaNYL (SUBLIMAZE) injection, magic mouthwash, menthol-cetylpyridinium, phenol  Antibiotics: Anti-infectives    Start     Dose/Rate Route Frequency Ordered Stop   01/15/15 0430  vancomycin (VANCOCIN) 1,750 mg  in sodium chloride 0.9 % 500 mL IVPB     1,750 mg 250 mL/hr over 120 Minutes Intravenous Every 48 hours 01/13/15 1254 01/17/15 0429   01/11/15 0700  vancomycin (VANCOCIN) 1,750 mg in sodium chloride 0.9 % 500 mL IVPB  Status:  Discontinued     1,750 mg 250 mL/hr over 120 Minutes Intravenous Daily 01/11/15 0648 01/13/15 1254   01/11/15 0330  piperacillin-tazobactam (ZOSYN) IVPB 3.375 g     3.375 g 12.5 mL/hr over 240 Minutes Intravenous 3 times per day 01/11/15 0321 01/16/15 0559        Assessment/Plan Strangulated incisional hernia with small bowel obstruction, 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 Dalbert Batman -stable and improving.  ~1043m NGT output, +BS.  Clamping trial soon. -VAC change M-W-F, will evaluate wound today.  -needs to mobilize -IS ID-zosyn D#3/5 for peritonitis. Vanc D#3.  Follow cultures.  VTE prophylaxis-will discuss if ok to resume anticoagulation with attending.  Hx PAF and PE on coumadin CKD COPD FEN-NPO, hope to start clamping trial in the next day or 2.  If he fails to open up soon, then will start TPN as his prealbumin is 8. IVF, pain control.   EErby Pian ALegacy [facility] Pager [phone](7A-4:30P)   01/14/2015 8:31 AM

## 2015-01-14 NOTE — Progress Notes (Signed)
Patient instructed on the use of Flutter valve. Patient was able to demonstrate proper technique on use.  RT will continue to monitor.

## 2015-01-14 NOTE — Evaluation (Signed)
Physical Therapy Evaluation Patient Details Name: Gary Boyer MRN: [id number] DOB: 12-20-1935 Today's Date: 01/14/2015   History of Present Illness  79 yo male transferred from [facility] hospital 01/11/15  with abdominal pain from incarcerated ventral hernia and SBO >> had SB resection with sub-total omentectomy 01/11/15, placement of  wound VAC and remained on Vent post-op. extubated 01/13/15.  Clinical Impression  Patient  Able to mobilize to recliner, stands  And pivots. Patient will benefit from PT to address problems listed in note below to return to functional independence. Pt's sats on 5 liters drop to 85%.    Follow Up Recommendations SNF;Supervision/Assistance - 24 hour (unless recovers to level for HHPT)    Equipment Recommendations  None recommended by PT    Recommendations for Other Services OT consult     Precautions / Restrictions Precautions Precautions: Fall Precaution Comments: wound VAC, NGS, monitor sats, on O2 Restrictions Weight Bearing Restrictions: No      Mobility  Bed Mobility Overal bed mobility: Needs Assistance;+ 2 for safety/equipment Bed Mobility: Rolling;Sidelying to Sit Rolling: Mod assist Sidelying to sit: Mod assist       General bed mobility comments: cues for abdominal protection,   Transfers Overall transfer level: Needs assistance Equipment used: Rolling walker (2 wheeled) Transfers: Sit to/from [facility] Sit to Stand: Mod assist;+2 physical assistance;+2 safety/equipment;From elevated surface Stand pivot transfers: Mod assist;+2 physical assistance;+2 safety/equipment       General transfer comment: cues for safety, extra time to  take  a few steps to get to recliner, good weight bearing, uses RW  Ambulation/Gait                Stairs            Wheelchair Mobility    Modified Rankin (Stroke Patients Only)       Balance Overall balance assessment: Needs assistance Sitting-balance support:  Bilateral upper extremity supported;Feet supported Sitting balance-Leahy Scale: Fair Sitting balance - Comments: maintains balance in sitting.                                     Pertinent Vitals/Pain Pain Assessment: Faces Faces Pain Scale: Hurts little more Pain Location: aqbdomen Pain Descriptors / Indicators: Discomfort;Cramping;Tightness Pain Intervention(s): Limited activity within patient's tolerance;Monitored during session;Repositioned    Home Living Family/patient expects to be discharged to:: Private residence Living Arrangements: Spouse/significant other Available Help at Discharge: Family Type of Home: House Home Access: Stairs to enter Entrance Stairs-Rails: None Technical brewer of Steps: 3 Home Layout: Two level Home Equipment: Environmental consultant - 2 wheels      Prior Function Level of Independence: Independent               Hand Dominance        Extremity/Trunk Assessment   Upper Extremity Assessment: Generalized weakness           Lower Extremity Assessment: Generalized weakness      Cervical / Trunk Assessment: Normal  Communication   Communication: No difficulties  Cognition Arousal/Alertness: Awake/alert Behavior During Therapy: WFL for tasks assessed/performed Overall Cognitive Status: Within Functional Limits for tasks assessed                      General Comments      Exercises        Assessment/Plan    PT Assessment Patient needs continued PT services  PT Diagnosis  Difficulty walking;Generalized weakness   PT Problem List Decreased strength;Decreased activity tolerance;Decreased range of motion;Decreased mobility;Cardiopulmonary status limiting activity;Decreased knowledge of precautions;Decreased safety awareness;Decreased knowledge of use of DME  PT Treatment Interventions DME instruction;Gait training;Stair training;Functional mobility training;Therapeutic activities;Therapeutic  exercise;Patient/family education   PT Goals (Current goals can be found in the Care Plan section) Acute Rehab PT Goals Patient Stated Goal: to get up and move PT Goal Formulation: With patient Time For Goal Achievement: 01/28/15 Potential to Achieve Goals: Good    Frequency Min 3X/week   Barriers to discharge        Co-evaluation               End of Session Equipment Utilized During Treatment: Oxygen Activity Tolerance: Patient tolerated treatment well Patient left: in chair;with call bell/phone within reach;with chair alarm set Nurse Communication: Mobility status         Time: 2707-8675 PT Time Calculation (min) (ACUTE ONLY): 22 min   Charges:   PT Evaluation $Initial PT Evaluation Tier I: 1 Procedure     PT G CodesClaretha Cooper 01/14/2015, 10:52 AM Tresa Endo PT [phone]

## 2015-01-14 NOTE — Progress Notes (Addendum)
PULMONARY / CRITICAL CARE MEDICINE   Name: Gary Boyer MRN: [id number] DOB: Feb 12, 1936    ADMISSION DATE:  01/11/2015 CONSULTATION DATE:  01/11/15    INITIAL PRESENTATION:  79 yo male transferred from [facility] hospital with abdominal pain from incarcerated ventral hernia and SBO >> had SB resection with sub-total omentectomy, and remained on Vent post-op. 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   VITAL SIGNS: Temp:  [98.3 F (36.8 C)-99.2 F (37.3 C)] 98.5 F (36.9 C) (07/11 0400) Pulse Rate:  [46-94] 50 (07/11 0600) Resp:  [19-31] 21 (07/11 0600) BP: (96-119)/(40-60) 114/50 mmHg (07/11 0600) SpO2:  [87 %-98 %] 93 % (07/11 0600) Arterial Line BP: (91-116)/(51-63) 103/63 mmHg (07/10 1023) Weight:  [135.9 kg (299 lb 9.7 oz)] 135.9 kg (299 lb 9.7 oz) (07/11 0400) 5 liters   INTAKE / OUTPUT:  Intake/Output Summary (Last 24 hours) at 01/14/15 0856 Last data filed at 01/14/15 0600  Gross per 24 hour  Intake 2203.66 ml  Output   4160 ml  Net -1956.34 ml    PHYSICAL EXAMINATION: General:  Chronically ill appearing, up in chair. No distress.  Neuro: awake, alert, no focal def  HEENT: NCAT Cardiovascular: regular with occasional extra systoles, no M notes Lungs: diminshed BS, no wheezes or other adventitious sounds Abdomen: Surgical wound present, no BS. Tenderness + but better Ext: cool, diminished DP pulses, no edema  LABS:  PULMONARY  Recent Labs Lab 01/11/15 0355 01/11/15 1625 01/12/15 1230  PHART 7.406 7.327* 7.380  PCO2ART 47.6* 46.9* 41.3  PO2ART 54.2* 64.9* 73.1*  HCO3 29.2* 23.8 23.9  TCO2 25.5 22.0 21.2  O2SAT 85.1 88.8 93.8    CBC  Recent Labs Lab 01/12/15 0622 01/13/15 0418 01/14/15 0340  HGB 12.0* 11.4*  10.0*  HCT 38.1* 36.6* 32.4*  WBC 11.1* 9.1 7.1  PLT 171 155 145*    COAGULATION  Recent Labs Lab 01/11/15 0325 01/11/15 1205 01/11/15 1630 01/12/15 0622  INR 2.14* 1.53* 1.63* 1.57*   CARDIAC    Recent Labs Lab 01/11/15 1630 01/12/15 0622  TROPONINI <0.03 0.04*   No results for input(s): PROBNP in the last 168 hours.  CHEMISTRY  Recent Labs Lab 01/11/15 1630 01/12/15 0622 01/12/15 2155 01/13/15 0418 01/14/15 0340  NA 140 139 142 142 142  K 4.0 4.1 4.2 3.7 3.7  CL 105 101 111 110 111  CO2 25 28 22 24 23   GLUCOSE 129* 115* 97 101* 97  BUN 31* 37* 36* 35* 33*  CREATININE 2.94* 3.10* 2.52* 2.22* 1.81*  CALCIUM 7.3* 7.0* 7.2* 7.4* 7.8*  MG  --  1.8  --  1.8 2.3  PHOS  --  4.6  --  3.5 3.2   Estimated Creatinine Clearance: 49.5 mL/min (by C-G formula based on Cr of 1.81). LIVER  Recent Labs Lab 01/11/15 0325 01/11/15 1205 01/11/15 1630 01/12/15 0622 01/13/15 0418  AST 17  --   --  71* 85*  ALT 11*  --   --  16* 19  ALKPHOS 63  --   --  42 42  BILITOT 1.6*  --   --  1.8* 2.1*  PROT 8.4*  --   --  6.8 6.1*  ALBUMIN 4.1  --   --  3.0* 2.6*  INR 2.14* 1.53* 1.63* 1.57*  --  INFECTIOUS  Recent Labs Lab 01/11/15 0325 01/11/15 1630 01/12/15 0622 01/13/15 0418  LATICACIDVEN 1.7  --   --   --   PROCALCITON  --  1.09 5.08 3.83   IMAGING x48h Dg Chest Port 1 View  01/13/2015   CLINICAL DATA:  Postop from hernia repair and small bowel resection. Acute on chronic respiratory failure with hypoxia. COPD exacerbation. On ventilator.  EXAM: PORTABLE CHEST - 1 VIEW  COMPARISON:  01/12/2015  FINDINGS: Endotracheal tube and nasogastric tube remain in place. Cardiomegaly and diffuse pulmonary interstitial prominence are stable. Atelectasis or infiltrate in the left retrocardiac lung base shows no significant change. No pneumothorax visualized.  IMPRESSION: No significant change in left retrocardiac atelectasis versus infiltrate.  Stable cardiomegaly and diffuse  interstitial prominence.   Electronically Signed   By: Earle Gell M.D.   On: 01/13/2015 07:35    ASSESSMENT / PLAN:  PULMONARY A: Acute hypoxic respiratory failure after surgery >> atelectasis and pulmonary edema. COPD. Hx of recurrent PE. Hx of OSA. P:   Continue pulmicort Restart brovana Change duonebs to prn prob resume lasix 7/12 Resume anticoagulation when okay with surgery CPAP qhs as tolerated  CARDIOVASCULAR A:  Hx of PAF s/p ablation. Frequent PVCs. Hypotension 2nd to hypovolemia >> resolved. P:  KVO IVFs Continue tele  Hold outpt norvasc, vasotec for now Consider adding back lasix 7/12  RENAL A:   AKI >> not sure what baseline renal fx is >> improving. P:   Monitor renal fx, urine outpt  GASTROINTESTINAL A:   SBO due to incarcerated ventral hernia Post laparotomy 7/08. Hx of HH, GERD. P:   Protonix for SUP Nutrition, post-op care per surgery  HEMATOLOGIC A:   Anemia of critical illness. P:  F/u CBC intermittently  INFECTIOUS A:   Severe sepsis from peritonitis. P:   Day 4 of zosyn   Blood 7/08 >> Abd wound 7/08 >>  ENDOCRINE A:   Hyperglycemia >> improved. P:   Monitor blood sugar on BMET Consider SSI if glucose > 180  NEUROLOGIC A:  Post-op pain. Hx of depression. P:   PRN fentanyl Resume outpt zoloft when he can take po meds  Erick Colace ACNP-BC [place] Pager # [phone] OR # [phone] if no answer   01/14/2015 8:56 AM  Reviewed above, and examined.  He denies chest pain.  Breathing okay.  Abdominal pain controlled.  He is sitting in chair.  NG tube in place.  Heart rate regular with frequent extra beats.  No wheezing.  Surgical wound site clean.  No edema.  Labs from 7/11 reviewed >> mild anemia/thrombocytopenia.  Creatinine improving.  CXR from 7/10 reviewed >> ATX with mild interstitial edema.  Continue bronchial hygiene, adjust BD regimen, adjust oxygen to keep SpO2 90 to 95%.  If renal  fx better and hemodynamics stable, then consider resuming lasix 7/12.  Will ask [facility] to resume primary care from 7/13.  PCCM will f/u as pulmonary consult.  Chesley Mires, MD [facility] Pulmonary/Critical Care 01/14/2015, 10:10 AM Pager:  [phone] After 3pm call: [phone]

## 2015-01-14 NOTE — Progress Notes (Signed)
Patient placed on Auto Titrate Cpap on 8 to 20 cmh2o. 3L of oxygen blend in va nasal mask. (Patient wears nasal mask at home). Patient stats he is comfortable and is tolerating well. RT will continue to monitor as needed.

## 2015-01-15 LAB — BASIC METABOLIC PANEL
Anion gap: 10 (ref 5–15)
BUN: 32 mg/dL — ABNORMAL HIGH (ref 6–20)
CALCIUM: 8.2 mg/dL — AB (ref 8.9–10.3)
CO2: 24 mmol/L (ref 22–32)
Chloride: 112 mmol/L — ABNORMAL HIGH (ref 101–111)
Creatinine, Ser: 1.66 mg/dL — ABNORMAL HIGH (ref 0.61–1.24)
GFR calc Af Amer: 44 mL/min — ABNORMAL LOW (ref >60)
GFR, EST NON AFRICAN AMERICAN: 38 mL/min — AB (ref >60)
GLUCOSE: 96 mg/dL (ref 65–99)
POTASSIUM: 3.7 mmol/L (ref 3.5–5.1)
Sodium: 146 mmol/L — ABNORMAL HIGH (ref 135–145)

## 2015-01-15 MED ORDER — ENOXAPARIN SODIUM 150 MG/ML ~~LOC~~ SOLN
1.0000 mg/kg | Freq: Two times a day (BID) | SUBCUTANEOUS | Status: DC
  Administered 2015-01-15 (×2): 135 mg via SUBCUTANEOUS
  Filled 2015-01-15 (×5): qty 1

## 2015-01-15 MED ORDER — SERTRALINE HCL 50 MG PO TABS
50.0000 mg | ORAL_TABLET | Freq: Every day | ORAL | Status: DC
  Administered 2015-01-15 – 2015-01-17 (×3): 50 mg via ORAL
  Filled 2015-01-15 (×3): qty 1

## 2015-01-15 NOTE — Progress Notes (Signed)
RT placed patient on CPAP. Patient setting is auto 8-20 cmH2O. Sterile water added to water chamber for humidification. Patient is tolerating well. RT will continue to monitor.

## 2015-01-15 NOTE — Progress Notes (Signed)
Patient transferred to 19. Has voided large amount urine incontinently. NG clamped and pt is tolerating clear liquids. NG residual to be checked at1415 today ,and if less than 400cc ng to be d/c per order received this am.

## 2015-01-15 NOTE — Progress Notes (Signed)
[facility] PROGRESS NOTE  Gary Boyer OEU:[id number] DOB: [birth year] DOA: 01/11/2015 PCP: No primary care provider on file.   Brief narrative from critical care: 79 yo male transferred from [facility] with abdominal pain from incarcerated ventral hernia and SBO >> had SB resection with sub-total omentectomy, and remained on Vent post-op. PMHx of COPD, recurrent PE on coumadin, dysrhythmia s/p ablation, GERD, OSA  Assessment/Plan: Principal Problem:   Incisional hernia with gangrene and obstruction s/p exlap/Sb resection & repair 01/11/2015 - Gen. surgery on board and managing  Active Problems:   Essential hypertension, benign -Blood pressures relatively well controlled off of hypertensive medication.    COPD exacerbation -Pulmonary doctor on board and currently is made recommendations. We'll continue pulmonologist recommendations. Please refer to their note    Acute-on-chronic kidney injury - Improving currently with improved oral intake. May have been secondary to hypotension. Hypotension as mentioned above resolved - We'll continue to monitor    Atrial fibrillation - Rate controlled off of rate controlling medication. Prior to admission medications reviewed and no rate controlling medications were listed -Anticoagulation to be resumed once okay with general surgery, of note patient was on Coumadin as outpatient    Pulmonary embolism - Anticoagulation to be resumed once okay with surgery   Code Status: Full Family Communication: None at bedside discussed directly with patient Disposition Plan: Transfer to telemetry   Consultants:  Critical care   general surgery  Procedures: 7/08 Transfer from [place], [place] consulted, coumadin reversed, to OR 7/09 Elevated creatinine, remains on phenylephrine 7/10 Off pressors, extubated 7/11 To SDU  Antibiotics:  Zosyn  HPI/Subjective: Patient has no new complaints today  Objective: Filed Vitals:   01/15/15 1146   BP: 137/65  Pulse: 75  Temp: 97.7 F (36.5 C)  Resp: 21    Intake/Output Summary (Last 24 hours) at 01/15/15 1238 Last data filed at 01/15/15 1100  Gross per 24 hour  Intake   1752 ml  Output   4175 ml  Net  -2423 ml   Filed Weights   01/13/15 0400 01/14/15 0400 01/15/15 0535  Weight: 135.5 kg (298 lb 11.6 oz) 135.9 kg (299 lb 9.7 oz) 133 kg (293 lb 3.4 oz)    Exam:   General:  Patient in no acute distress, alert and awake  Cardiovascular: S1 and S2 present, no rubs  Respiratory: No increased work of breathing, nasal cannula in place, equal chest rise, no wheezes  Abdomen: Soft, obese, no guarding  Musculoskeletal: Equal tone Bilaterally   Data Reviewed: Basic Metabolic Panel:  Recent Labs Lab 01/12/15 0622 01/12/15 2155 01/13/15 0418 01/14/15 0340 01/15/15 0340  NA 139 142 142 142 146*  K 4.1 4.2 3.7 3.7 3.7  CL 101 111 110 111 112*  CO2 28 22 24 23 24   GLUCOSE 115* 97 101* 97 96  BUN 37* 36* 35* 33* 32*  CREATININE 3.10* 2.52* 2.22* 1.81* 1.66*  CALCIUM 7.0* 7.2* 7.4* 7.8* 8.2*  MG 1.8  --  1.8 2.3  --   PHOS 4.6  --  3.5 3.2  --    Liver Function Tests:  Recent Labs Lab 01/11/15 0325 01/12/15 0622 01/13/15 0418  AST 17 71* 85*  ALT 11* 16* 19  ALKPHOS 63 42 42  BILITOT 1.6* 1.8* 2.1*  PROT 8.4* 6.8 6.1*  ALBUMIN 4.1 3.0* 2.6*   No results for input(s): LIPASE, AMYLASE in the last 168 hours. No results for input(s): AMMONIA in the last 168 hours. CBC:  Recent Labs Lab  01/11/15 0325 01/11/15 0650 01/11/15 1630 01/12/15 0622 01/13/15 0418 01/14/15 0340  WBC 14.5* 16.2* 3.6* 11.1* 9.1 7.1  NEUTROABS 12.6*  --   --   --  6.8 5.5  HGB 14.4 14.6 12.0* 12.0* 11.4* 10.0*  HCT 43.4 45.1 37.8* 38.1* 36.6* 32.4*  MCV 87.7 88.3 90.0 91.8 91.7 91.8  PLT 194 208 136* 171 155 145*   Cardiac Enzymes:  Recent Labs Lab 01/11/15 1630 01/12/15 0622  TROPONINI <0.03 0.04*   BNP (last 3 results)  Recent Labs  01/11/15 0325  BNP 119.7*     ProBNP (last 3 results) No results for input(s): PROBNP in the last 8760 hours.  CBG: No results for input(s): GLUCAP in the last 168 hours.  Recent Results (from the past 240 hour(s))  Culture, blood (routine x 2)     Status: None (Preliminary result)   Collection Time: 01/11/15  6:50 AM  Result Value Ref Range Status   Specimen Description BLOOD RIGHT HAND  Final   Special Requests BOTTLES DRAWN AEROBIC ONLY 3CC  Final   Culture   Final    NO GROWTH 3 DAYS Performed at [facility]    Report Status PENDING  Incomplete  Culture, blood (routine x 2)     Status: None (Preliminary result)   Collection Time: 01/11/15  6:50 AM  Result Value Ref Range Status   Specimen Description BLOOD RIGHT ARM  Final   Special Requests BOTTLES DRAWN AEROBIC AND ANAEROBIC 5CC  Final   Culture   Final    NO GROWTH 3 DAYS Performed at [facility]    Report Status PENDING  Incomplete  Surgical pcr screen     Status: None   Collection Time: 01/11/15  6:59 AM  Result Value Ref Range Status   MRSA, PCR NEGATIVE NEGATIVE Final   Staphylococcus aureus NEGATIVE NEGATIVE Final    Comment:        The Xpert SA Assay (FDA approved for NASAL specimens in patients over 73 years of age), is one component of a comprehensive surveillance program.  Test performance has been validated by [facility] for patients greater than or equal to 64 year old. It is not intended to diagnose infection nor to guide or monitor treatment.   Anaerobic culture     Status: None (Preliminary result)   Collection Time: 01/11/15  1:41 PM  Result Value Ref Range Status   Specimen Description WOUND INCARCERATED VENTRAL HERNIA  Final   Special Requests PATIENT ON FOLLOWING VANCOMYCIN AND ZOSYN  Final   Gram Stain   Final    RARE WBC PRESENT, PREDOMINANTLY MONONUCLEAR NO SQUAMOUS EPITHELIAL CELLS SEEN NO ORGANISMS SEEN Performed at [facility]    Culture   Final    NO ANAEROBES ISOLATED;  CULTURE IN PROGRESS FOR 5 DAYS Performed at [facility]    Report Status PENDING  Incomplete  Wound culture     Status: None   Collection Time: 01/11/15  1:41 PM  Result Value Ref Range Status   Specimen Description WOUND INCARCERATED VENTRAL HERNIA  Final   Special Requests PATIENT ON FOLLOWING VANCOMYCIN AND ZOSYN  Final   Gram Stain   Final    RARE WBC PRESENT, PREDOMINANTLY MONONUCLEAR NO SQUAMOUS EPITHELIAL CELLS SEEN NO ORGANISMS SEEN Performed at [facility]    Culture   Final    NO GROWTH 2 DAYS Performed at [facility]    Report Status 01/14/2015 FINAL  Final  Studies: No results found.  Scheduled Meds: . antiseptic oral rinse  7 mL Mouth Rinse q12n4p  . arformoterol  15 mcg Nebulization BID  . budesonide (PULMICORT) nebulizer solution  0.5 mg Nebulization BID  . chlorhexidine  15 mL Mouth Rinse BID  . enoxaparin (LOVENOX) injection  1 mg/kg Subcutaneous Q12H  . lip balm  1 application Topical BID  . pantoprazole (PROTONIX) IV  40 mg Intravenous Q12H  . piperacillin-tazobactam (ZOSYN)  IV  3.375 g Intravenous 3 times per day  . QUEtiapine  25 mg Oral QHS,MR X 1  . sertraline  50 mg Oral Daily   Continuous Infusions: . sodium chloride 10 mL/hr at 01/15/15 1037    Time spent: > 35 minutes   Velvet Bathe  [facility] Pager [phone] If 7PM-7AM, please contact night-coverage at [email or web address], password [facility] 01/15/2015, 12:38 PM  LOS: 4 days

## 2015-01-15 NOTE — Progress Notes (Signed)
Patient ID: Gary Boyer, male   DOB: Dec 05, 1935, 79 y.o.   MRN: [id number]     [facility]      [address] [place], [place] [address]    Phone: [phone] FAX: [phone]     Subjective: Passing flatus.  Thirsty.  ~585NGT output.  Up to [facility].  Pulling 1250 on IS but uses infrequently.   Objective:  Vital signs:  Filed Vitals:   01/15/15 0500 01/15/15 0535 01/15/15 0600 01/15/15 0751  BP:   124/55   Pulse: 82  74   Temp:      TempSrc:      Resp: 28  27   Height:      Weight:  133 kg (293 lb 3.4 oz)    SpO2: 89%  92% 93%    Last BM Date: 01/10/15  Intake/Output   Yesterday:  07/11 0701 - 07/12 0700 In: 1635.3 [P.O.:1240; I.V.:245.3; IV Piggyback:150] Out: 6283 [Urine:1825; Emesis/NG output:1850; Drains:150] This shift:    I/O last 3 completed shifts: In: 2765.3 [P.O.:1720; I.V.:795.3; IV Piggyback:250] Out: 1517 [OHYWV:3710; Emesis/NG output:2950; Drains:150]    Physical Exam: General: Pt awake/alert/oriented x4 in no acute distress Abdomen: Soft. Nondistended. Midline wound-vac in place with serosanguinous output. Mildly tender at incisions only. No evidence of peritonitis. No incarcerated hernias.    Problem List:   Principal Problem:   Incisional hernia with gangrene and obstruction s/p exlap/Sb resection & repair 01/11/2015 Active Problems:   Morbid obesity   Essential hypertension, benign   COPD exacerbation   Acute and chronic respiratory failure with hypoxia   Acute-on-chronic kidney injury   Atrial fibrillation   Pulmonary embolism   Chronic anticoagulation   Shock circulatory   Acute renal failure   Acute respiratory failure with hypoxia   Acute post-operative pain   COPD (chronic obstructive pulmonary disease)    Results:   Labs: Results for orders placed or performed during the hospital encounter of 01/11/15 (from the past 48 hour(s))  Basic metabolic panel     Status: Abnormal   Collection Time: 01/14/15  3:40 AM  Result Value Ref Range   Sodium 142 135 - 145 mmol/L   Potassium 3.7 3.5 - 5.1 mmol/L   Chloride 111 101 - 111 mmol/L   CO2 23 22 - 32 mmol/L   Glucose, Bld 97 65 - 99 mg/dL   BUN 33 (H) 6 - 20 mg/dL   Creatinine, Ser 1.81 (H) 0.61 - 1.24 mg/dL   Calcium 7.8 (L) 8.9 - 10.3 mg/dL   GFR calc non Af Amer 34 (L) >60 mL/min   GFR calc Af Amer 40 (L) >60 mL/min    Comment: (NOTE) The eGFR has been calculated using the CKD EPI equation. This calculation has not been validated in all clinical situations. eGFR's persistently <60 mL/min signify possible Chronic Kidney Disease.    Anion gap 8 5 - 15  Phosphorus     Status: None   Collection Time: 01/14/15  3:40 AM  Result Value Ref Range   Phosphorus 3.2 2.5 - 4.6 mg/dL  Magnesium     Status: None   Collection Time: 01/14/15  3:40 AM  Result Value Ref Range   Magnesium 2.3 1.7 - 2.4 mg/dL  CBC with Differential/Platelet     Status: Abnormal   Collection Time: 01/14/15  3:40 AM  Result Value Ref Range   WBC 7.1 4.0 - 10.5 K/uL   RBC 3.53 (L) 4.22 - 5.81 MIL/uL  Hemoglobin 10.0 (L) 13.0 - 17.0 g/dL   HCT 32.4 (L) 39.0 - 52.0 %   MCV 91.8 78.0 - 100.0 fL   MCH 28.3 26.0 - 34.0 pg   MCHC 30.9 30.0 - 36.0 g/dL   RDW 15.3 11.5 - 15.5 %   Platelets 145 (L) 150 - 400 K/uL   Neutrophils Relative % 78 (H) 43 - 77 %   Neutro Abs 5.5 1.7 - 7.7 K/uL   Lymphocytes Relative 11 (L) 12 - 46 %   Lymphs Abs 0.8 0.7 - 4.0 K/uL   Monocytes Relative 9 3 - 12 %   Monocytes Absolute 0.6 0.1 - 1.0 K/uL   Eosinophils Relative 2 0 - 5 %   Eosinophils Absolute 0.1 0.0 - 0.7 K/uL   Basophils Relative 0 0 - 1 %   Basophils Absolute 0.0 0.0 - 0.1 K/uL  Basic metabolic panel     Status: Abnormal   Collection Time: 01/15/15  3:40 AM  Result Value Ref Range   Sodium 146 (H) 135 - 145 mmol/L   Potassium 3.7 3.5 - 5.1 mmol/L   Chloride 112 (H) 101 - 111 mmol/L   CO2 24 22 - 32 mmol/L   Glucose, Bld 96 65 - 99 mg/dL    BUN 32 (H) 6 - 20 mg/dL   Creatinine, Ser 1.66 (H) 0.61 - 1.24 mg/dL   Calcium 8.2 (L) 8.9 - 10.3 mg/dL   GFR calc non Af Amer 38 (L) >60 mL/min   GFR calc Af Amer 44 (L) >60 mL/min    Comment: (NOTE) The eGFR has been calculated using the CKD EPI equation. This calculation has not been validated in all clinical situations. eGFR's persistently <60 mL/min signify possible Chronic Kidney Disease.    Anion gap 10 5 - 15    Imaging / Studies: No results found.  Medications / Allergies:  Scheduled Meds: . antiseptic oral rinse  7 mL Mouth Rinse q12n4p  . arformoterol  15 mcg Nebulization BID  . budesonide (PULMICORT) nebulizer solution  0.5 mg Nebulization BID  . chlorhexidine  15 mL Mouth Rinse BID  . lip balm  1 application Topical BID  . pantoprazole (PROTONIX) IV  40 mg Intravenous Q12H  . piperacillin-tazobactam (ZOSYN)  IV  3.375 g Intravenous 3 times per day  . QUEtiapine  25 mg Oral QHS,MR X 1   Continuous Infusions: . sodium chloride 10 mL/hr at 01/14/15 0938   PRN Meds:.alum & mag hydroxide-simeth, bisacodyl, fentaNYL (SUBLIMAZE) injection, ipratropium-albuterol, magic mouthwash, menthol-cetylpyridinium, phenol  Antibiotics: Anti-infectives    Start     Dose/Rate Route Frequency Ordered Stop   01/15/15 0430  vancomycin (VANCOCIN) 1,750 mg in sodium chloride 0.9 % 500 mL IVPB  Status:  Discontinued     1,750 mg 250 mL/hr over 120 Minutes Intravenous Every 48 hours 01/13/15 1254 01/14/15 0938   01/11/15 0700  vancomycin (VANCOCIN) 1,750 mg in sodium chloride 0.9 % 500 mL IVPB  Status:  Discontinued     1,750 mg 250 mL/hr over 120 Minutes Intravenous Daily 01/11/15 0648 01/13/15 1254   01/11/15 0330  piperacillin-tazobactam (ZOSYN) IVPB 3.375 g     3.375 g 12.5 mL/hr over 240 Minutes Intravenous 3 times per day 01/11/15 0321 01/16/15 0559        Assessment/Plan Strangulated incisional hernia with small bowel obstruction, infarcted small bowel without perforation,  infarcted omentum POD#4 exploratory laparotomy, small bowel resection x2, subtotal omentectomy, primary repair of strangulated incisional hernia---Dr. Dalbert Batman -stable.  Clamp  NGT and give clears.  DC in 6 hours if no n/v, distention.  -VAC change M-W-F -needs to mobilize -AV(6979) -DC foley ID-zosyn D#4/5 for peritonitis. Follow cultures.  VTE prophylaxis-resume lovenox. Hold off on coumadin until tolerating POs.  Hx PAF and PE on coumadin CKD COPD FEN-clamping trials.  If he fails, will need maintenance fluids.  Add po pain meds.  Dispo-can transfer from surgical standpoint.   Erby Pian, [facility] Surgery Pager [phone](7A-4:30P)   01/15/2015 8:02 AM

## 2015-01-15 NOTE — Progress Notes (Signed)
ANTICOAGULATION CONSULT NOTE - Initial Consult  Pharmacy Consult for enoxaparin Indication: PE  Allergies  Allergen Reactions  . Ciprofloxacin Diarrhea  . Morphine And Related     HALLUCINATIONS  . Zithromax [Azithromycin] Diarrhea    Patient Measurements: Height: 6\' 1"  (185.4 cm) Weight: 293 lb 3.4 oz (133 kg) IBW/kg (Calculated) : 79.9   Vital Signs: Temp: 98.2 F (36.8 C) (07/12 0300) Temp Source: Axillary (07/12 0300) BP: 124/55 mmHg (07/12 0600) Pulse Rate: 74 (07/12 0600)  Labs:  Recent Labs  01/13/15 0418 01/14/15 0340 01/15/15 0340  HGB 11.4* 10.0*  --   HCT 36.6* 32.4*  --   PLT 155 145*  --   CREATININE 2.22* 1.81* 1.66*    Estimated Creatinine Clearance: 53.3 mL/min (by C-G formula based on Cr of 1.66).   Medical History: Past Medical History  Diagnosis Date  . Dysrhythmia     PAST HX OF HEART ABLATION FOR IRREGULAR HEART RATE --SUCCESSFUL-NO FURTHER PROB WITH IRREGULAR HEART BEAT  . COPD (chronic obstructive pulmonary disease)     HX OF SMOKING FOR 60 YRS-PT SEES DR. Winona Legato IN [place]   . Shortness of breath     WITH ANY EXERTION-USES OXYGEN AS NEEDED  . Pneumonia     IN THE PAST - BUT NOT IN THE LAST YR  . Asthma   . Pulmonary embolus 2011 OR 2012    CHRONIC WARFARIN SINCE THE BLOOD CLOT  . Bilateral leg edema     WEARS SUPPORT HOSE  . History of kidney stones     SOME SMALL STONES AT PRESENT TIME- TAKES POTASSIUM CITRATE  . H/O hiatal hernia   . GERD (gastroesophageal reflux disease)   . Cancer     SKIN CANCERS  . Arthritis     ALL OVER  . Sleep apnea     USES BIPAP  SETTING 16 / 11 - PER PULMONOGIST'S OFFICE NOTE    Medications:  Scheduled:  . antiseptic oral rinse  7 mL Mouth Rinse q12n4p  . arformoterol  15 mcg Nebulization BID  . budesonide (PULMICORT) nebulizer solution  0.5 mg Nebulization BID  . chlorhexidine  15 mL Mouth Rinse BID  . lip balm  1 application Topical BID  . pantoprazole (PROTONIX) IV  40 mg  Intravenous Q12H  . piperacillin-tazobactam (ZOSYN)  IV  3.375 g Intravenous 3 times per day  . QUEtiapine  25 mg Oral QHS,MR X 1    Assessment: 79 yo male transferred from [place] with abdominal pain from incarcerated ventral hernia and SBO >> had SB resection with sub-total omentectomy, and remained on Vent post-op. 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  Pharmacy consulted to dose enoxaparin until pt can be transitioned back on warfarin.  Today 01/15/2015  INR 1.57 (7/9) H/H low but stable Plts 145 Scr 1.66, CrCl 3mls/min   Goal of Therapy:  Anti-Xa level 0.6-1 units/ml 4hrs after LMWH dose given Monitor platelets by anticoagulation protocol   Plan:   Enoxaparin 1mg /kg (130mg ) SQ q12h  Monitor renal function  CBC q72 hours  Follow up resuming warfarin (tolerating po's)  Dolly Rias RPh 01/15/2015, 8:13 AM Pager [phone]

## 2015-01-15 NOTE — Care Management Important Message (Signed)
Important Message  Patient Details  Name: Gary Boyer MRN: [id number] Date of Birth: 09-02-1935   Medicare Important Message Given:  Yes-second notification given    Loann Quill 01/15/2015, 3:14 PM

## 2015-01-15 NOTE — Progress Notes (Addendum)
PULMONARY / CRITICAL CARE MEDICINE   Name: Gary Boyer MRN: [id number] DOB: 1935-09-27    ADMISSION DATE:  01/11/2015 CONSULTATION DATE:  01/11/15    INITIAL PRESENTATION:  79 yo male transferred from [facility] hospital with abdominal pain from incarcerated ventral hernia and SBO >> had SB resection with sub-total omentectomy, and remained on Vent post-op. 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  Had trouble wearing CPAP last night due to NG tube.  VITAL SIGNS: Temp:  [98.2 F (36.8 C)-98.7 F (37.1 C)] 98.2 F (36.8 C) (07/12 0300) Pulse Rate:  [74-91] 74 (07/12 0600) Resp:  [17-28] 27 (07/12 0600) BP: (103-148)/(32-69) 124/55 mmHg (07/12 0600) SpO2:  [83 %-95 %] 93 % (07/12 0751) Weight:  [133 kg (293 lb 3.4 oz)] 133 kg (293 lb 3.4 oz) (07/12 0535) 5 liters   INTAKE / OUTPUT:  Intake/Output Summary (Last 24 hours) at 01/15/15 0809 Last data filed at 01/15/15 8366  Gross per 24 hour  Intake 1635.33 ml  Output   3825 ml  Net -2189.67 ml    PHYSICAL EXAMINATION: General:  Chronically ill appearing, up in chair. No distress.  Neuro: awake, alert, no focal def  HEENT: NCAT Cardiovascular: regular with occasional extra systoles, no M notes Lungs: diminshed BS, no wheezes or other adventitious sounds Abdomen: Surgical wound present, no BS. Tenderness + but better Ext: cool, diminished DP pulses, no edema  LABS:  Recent Labs Lab 01/12/15 0622 01/13/15 0418 01/14/15 0340  HGB 12.0* 11.4* 10.0*  HCT 38.1* 36.6* 32.4*  WBC 11.1* 9.1 7.1  PLT 171 155 145*    COAGULATION  Recent Labs Lab 01/11/15 0325 01/11/15 1205 01/11/15 1630 01/12/15 0622  INR 2.14* 1.53* 1.63* 1.57*   No results for input(s): PROBNP in the last 168  hours.  CHEMISTRY  Recent Labs Lab 01/12/15 0622 01/12/15 2155 01/13/15 0418 01/14/15 0340 01/15/15 0340  NA 139 142 142 142 146*  K 4.1 4.2 3.7 3.7 3.7  CL 101 111 110 111 112*  CO2 28 22 24 23 24   GLUCOSE 115* 97 101* 97 96  BUN 37* 36* 35* 33* 32*  CREATININE 3.10* 2.52* 2.22* 1.81* 1.66*  CALCIUM 7.0* 7.2* 7.4* 7.8* 8.2*  MG 1.8  --  1.8 2.3  --   PHOS 4.6  --  3.5 3.2  --    Estimated Creatinine Clearance: 53.3 mL/min (by C-G formula based on Cr of 1.66).   LIVER  Recent Labs Lab 01/11/15 0325 01/11/15 1205 01/11/15 1630 01/12/15 0622 01/13/15 0418  AST 17  --   --  71* 85*  ALT 11*  --   --  16* 19  ALKPHOS 63  --   --  42 42  BILITOT 1.6*  --   --  1.8* 2.1*  PROT 8.4*  --   --  6.8 6.1*  ALBUMIN 4.1  --   --  3.0* 2.6*  INR 2.14* 1.53* 1.63* 1.57*  --    INFECTIOUS  Recent Labs Lab 01/11/15 0325 01/11/15 1630 01/12/15 0622 01/13/15 0418  LATICACIDVEN 1.7  --   --   --   PROCALCITON  --  1.09 5.08 3.83   IMAGING x48h No results found.  ASSESSMENT / PLAN:  PULMONARY A: Acute hypoxic respiratory failure after surgery >> atelectasis  and pulmonary edema. COPD. Hx of recurrent PE. Hx of OSA. > improved w/ decreased O2 requirements  P:   Continue pulmicort and brovana Changed duonebs to prn Would hold off on lasix for now-->autodiuresing well  Resume anticoagulation when okay with surgery CPAP qhs as tolerated after NG tube removed  CARDIOVASCULAR A:  Hx of PAF s/p ablation. Frequent PVCs. Hypotension 2nd to hypovolemia >> resolved. P:  KVO IVFs Continue tele  Hold outpt norvasc, vasotec for now  RENAL A:   AKI >> not sure what baseline renal fx is >> improving. P:   Monitor renal fx, urine outpt  GASTROINTESTINAL A:   SBO due to incarcerated ventral hernia Post laparotomy 7/08. Hx of HH, GERD. P:   Protonix for SUP Nutrition, post-op care per surgery >> to start clears today   HEMATOLOGIC A:   Anemia of critical  illness. P:  F/u CBC intermittently  INFECTIOUS A:   Severe sepsis from peritonitis. P:   Day 5 of zosyn   Blood 7/08 >> Abd wound 7/08 >>neg   ENDOCRINE A:   Hyperglycemia >> improved. P:   Monitor blood sugar on BMET Consider SSI if glucose > 180  NEUROLOGIC A:  Post-op pain. Hx of depression. P:   PRN fentanyl Resume outpt zoloft   He looks good. Autodiuresis well and O2 requirements have improved. Ready for transfer to tele. Cont current BD regimen and HS CPAP. We will s/o.   Erick Colace ACNP-[place] Pager # [phone] OR # [phone] if no answer   01/15/2015 8:09 AM  Reviewed above, examined.  Breathing better.  Starting clears this AM.  Denies chest pain.  Alert, heart rate regular, basilar crackles, no wheeze, no edema.  Labs from 7/12 reviewed.  Continue BD regimen, bronchial hygiene.  Resume CPAP after NG tube removed.  Adjust oxygen to keep SpO2 90 to 95%.  Okay to transfer out of SDU from pulmonary standpoint.  Pulmonary plan in place.  PCCM will sign off.  Please call if further assistance needed.  Chesley Mires, MD [facility] Pulmonary/Critical Care 01/15/2015, 9:20 AM Pager:  [phone] After 3pm call: [phone]

## 2015-01-16 DIAGNOSIS — J441 Chronic obstructive pulmonary disease with (acute) exacerbation: Secondary | ICD-10-CM

## 2015-01-16 DIAGNOSIS — N189 Chronic kidney disease, unspecified: Secondary | ICD-10-CM

## 2015-01-16 DIAGNOSIS — N179 Acute kidney failure, unspecified: Secondary | ICD-10-CM

## 2015-01-16 DIAGNOSIS — K431 Incisional hernia with gangrene: Principal | ICD-10-CM

## 2015-01-16 LAB — ANAEROBIC CULTURE

## 2015-01-16 LAB — CULTURE, BLOOD (ROUTINE X 2)
Culture: NO GROWTH
Culture: NO GROWTH

## 2015-01-16 MED ORDER — WARFARIN SODIUM 5 MG PO TABS
5.0000 mg | ORAL_TABLET | Freq: Once | ORAL | Status: DC
  Filled 2015-01-16: qty 1

## 2015-01-16 MED ORDER — WARFARIN - PHARMACIST DOSING INPATIENT: Freq: Every day | Status: DC

## 2015-01-16 MED ORDER — ENOXAPARIN SODIUM 40 MG/0.4ML ~~LOC~~ SOLN
40.0000 mg | SUBCUTANEOUS | Status: DC
  Filled 2015-01-16: qty 0.4

## 2015-01-16 MED ORDER — ENOXAPARIN SODIUM 80 MG/0.8ML ~~LOC~~ SOLN
65.0000 mg | SUBCUTANEOUS | Status: DC
  Administered 2015-01-16 – 2015-01-17 (×2): 65 mg via SUBCUTANEOUS
  Filled 2015-01-16 (×2): qty 0.8

## 2015-01-16 MED ORDER — WARFARIN SODIUM 7.5 MG PO TABS
7.5000 mg | ORAL_TABLET | Freq: Once | ORAL | Status: AC
  Administered 2015-01-16: 7.5 mg via ORAL
  Filled 2015-01-16: qty 1

## 2015-01-16 MED ORDER — HYDROCODONE-ACETAMINOPHEN 5-325 MG PO TABS
1.0000 | ORAL_TABLET | ORAL | Status: DC | PRN
  Administered 2015-01-17: 2 via ORAL
  Filled 2015-01-16: qty 2

## 2015-01-16 NOTE — Progress Notes (Signed)
[facility] PROGRESS NOTE  Gary Boyer NWG:[id number] DOB: 09-23-1935 DOA: 01/11/2015 PCP: No primary care provider on file.  Brief narrative 79 year old male with history of hypertension, COPD, chronic kidney disease, GERD, OSA , A. fib on Coumadin, history of PE who was transferred from [facility] after being admitted with abdominal pain from incarcerated ventral hernia with small bowel obstruction. Patient underwent small bowel resection with subtotal omentectomy . He was monitored in ICU on vent postop and then transferred to hospitalist service.  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 Stable postop) day 5) Currently on wound VAC being changed 3 times a week. Wound care following. Completed 5 day course of IV Zosyn for peritonitis. -Tolerating diet and having several bowel movements. Pain controlled with when necessary fentanyl and Vicodin. -Surgery following.  Active problems COPD exacerbation Symptoms better today. Continue Pulmicort and Brovana nebs.  A. fib Rate controlled. Coumadin resumed. Bridging with Lovenox until therapeutic  Chronic kidney disease stage III Renal function appears to be improved to baseline.  Depression Continue Zoloft and Seroquel.  History of PE in ?2012 On Coumadin.  Diet: Soft DVT prophylaxis: On Coumadin and Lovenox   Code Status: Full code Family Communication: None at bedside Disposition Plan: SNF for PT.   Consultants:  CCS  Procedures:  Laparotomy 7/8  Antibiotics:  Completed 5 day course of IV Zosyn on 7/13  HPI/Subjective: Patient seen and examined. Reports feeling very weak and tired. Denies shortness of breath.  Objective: Filed Vitals:   01/16/15 0620  BP: 120/60  Pulse: 72   Temp: 97.8 F (36.6 C)  Resp: 18    Intake/Output Summary (Last 24 hours) at 01/16/15 1313 Last data filed at 01/16/15 1045  Gross per 24 hour  Intake    480 ml  Output    875 ml  Net   -395 ml   Filed Weights   01/13/15 0400 01/14/15 0400 01/15/15 0535  Weight: 135.5 kg (298 lb 11.6 oz) 135.9 kg (299 lb 9.7 oz) 133 kg (293 lb 3.4 oz)    Exam:   General:  Elderly male in no acute distress  HEENT: No pallor, moist oral mucosa, supple  Chest: Clear to auscultation bilaterally, no added sounds  CVS: S1 and S2 is irregularly irregular, no murmurs rub or gallop  GI: Wound VAC in place, bowel sounds present, soft, nondistended, nontender  Musculoskeletal musculoskeletal: Warm, no edema  CNS: Alert and oriented    Data Reviewed: Basic Metabolic Panel:  Recent Labs Lab 01/12/15 0622 01/12/15 2155 01/13/15 0418 01/14/15 0340 01/15/15 0340  NA 139 142 142 142 146*  K 4.1 4.2 3.7 3.7 3.7  CL 101 111 110 111 112*  CO2 28 22 24 23 24   GLUCOSE 115* 97 101* 97 96  BUN 37* 36* 35* 33* 32*  CREATININE 3.10* 2.52* 2.22* 1.81* 1.66*  CALCIUM 7.0* 7.2* 7.4* 7.8* 8.2*  MG 1.8  --  1.8 2.3  --   PHOS 4.6  --  3.5 3.2  --    Liver Function Tests:  Recent Labs Lab 01/11/15 0325 01/12/15 0622 01/13/15 0418  AST 17 71* 85*  ALT 11* 16* 19  ALKPHOS 63 42 42  BILITOT 1.6* 1.8* 2.1*  PROT 8.4* 6.8 6.1*  ALBUMIN 4.1 3.0* 2.6*   No results for input(s): LIPASE, AMYLASE in the last 168 hours. No  results for input(s): AMMONIA in the last 168 hours. CBC:  Recent Labs Lab 01/11/15 0325 01/11/15 0650 01/11/15 1630 01/12/15 0622 01/13/15 0418 01/14/15 0340  WBC 14.5* 16.2* 3.6* 11.1* 9.1 7.1  NEUTROABS 12.6*  --   --   --  6.8 5.5  HGB 14.4 14.6 12.0* 12.0* 11.4* 10.0*  HCT 43.4 45.1 37.8* 38.1* 36.6* 32.4*  MCV 87.7 88.3 90.0 91.8 91.7 91.8  PLT 194 208 136* 171 155 145*   Cardiac Enzymes:  Recent Labs Lab 01/11/15 1630 01/12/15 0622  TROPONINI <0.03  0.04*   BNP (last 3 results)  Recent Labs  01/11/15 0325  BNP 119.7*    ProBNP (last 3 results) No results for input(s): PROBNP in the last 8760 hours.  CBG: No results for input(s): GLUCAP in the last 168 hours.  Recent Results (from the past 240 hour(s))  Culture, blood (routine x 2)     Status: None   Collection Time: 01/11/15  6:50 AM  Result Value Ref Range Status   Specimen Description BLOOD RIGHT HAND  Final   Special Requests BOTTLES DRAWN AEROBIC ONLY 3CC  Final   Culture   Final    NO GROWTH 5 DAYS Performed at [facility]    Report Status 01/16/2015 FINAL  Final  Culture, blood (routine x 2)     Status: None   Collection Time: 01/11/15  6:50 AM  Result Value Ref Range Status   Specimen Description BLOOD RIGHT ARM  Final   Special Requests BOTTLES DRAWN AEROBIC AND ANAEROBIC 5CC  Final   Culture   Final    NO GROWTH 5 DAYS Performed at [facility]    Report Status 01/16/2015 FINAL  Final  Surgical pcr screen     Status: None   Collection Time: 01/11/15  6:59 AM  Result Value Ref Range Status   MRSA, PCR NEGATIVE NEGATIVE Final   Staphylococcus aureus NEGATIVE NEGATIVE Final    Comment:        The Xpert SA Assay (FDA approved for NASAL specimens in patients over 104 years of age), is one component of a comprehensive surveillance program.  Test performance has been validated by [facility] for patients greater than or equal to 61 year old. It is not intended to diagnose infection nor to guide or monitor treatment.   Anaerobic culture     Status: None (Preliminary result)   Collection Time: 01/11/15  1:41 PM  Result Value Ref Range Status   Specimen Description WOUND INCARCERATED VENTRAL HERNIA  Final   Special Requests PATIENT ON FOLLOWING VANCOMYCIN AND ZOSYN  Final   Gram Stain   Final    RARE WBC PRESENT, PREDOMINANTLY MONONUCLEAR NO SQUAMOUS EPITHELIAL CELLS SEEN NO ORGANISMS SEEN Performed at [facility]    Culture    Final    NO ANAEROBES ISOLATED; CULTURE IN PROGRESS FOR 5 DAYS Performed at [facility]    Report Status PENDING  Incomplete  Wound culture     Status: None   Collection Time: 01/11/15  1:41 PM  Result Value Ref Range Status   Specimen Description WOUND INCARCERATED VENTRAL HERNIA  Final   Special Requests PATIENT ON FOLLOWING VANCOMYCIN AND ZOSYN  Final   Gram Stain   Final    RARE WBC PRESENT, PREDOMINANTLY MONONUCLEAR NO SQUAMOUS EPITHELIAL CELLS SEEN NO ORGANISMS SEEN Performed at [facility]    Culture   Final    NO GROWTH 2 DAYS Performed at [facility]    Report Status 01/14/2015 FINAL  Final     Studies: No results found.  Scheduled Meds: . antiseptic oral rinse  7 mL Mouth Rinse q12n4p  . arformoterol  15 mcg Nebulization BID  . budesonide (PULMICORT) nebulizer solution  0.5 mg Nebulization BID  . chlorhexidine  15 mL Mouth Rinse BID  . enoxaparin (LOVENOX) injection  65 mg Subcutaneous Q24H  . lip balm  1 application Topical BID  . pantoprazole (PROTONIX) IV  40 mg Intravenous Q12H  . QUEtiapine  25 mg Oral QHS,MR X 1  . sertraline  50 mg Oral Daily  . warfarin  7.5 mg Oral Once  . Warfarin - Pharmacist Dosing Inpatient   Does not apply q1800   Continuous Infusions: . sodium chloride 10 mL/hr at 01/15/15 1037     Time spent: 25 minutes    Ilianna Bown, [place]  [facility] Pager [phone] If 7PM-7AM, please contact night-coverage at [email or web address], password [facility] 01/16/2015, 1:13 PM  LOS: 5 days

## 2015-01-16 NOTE — Progress Notes (Signed)
Patient ID: Gary Boyer, male   DOB: [birth year], 79 y.o.   MRN: [id number]     [facility]      [address] [place], [place] [address]    Phone: [phone] FAX: [phone]     Subjective: Says he feels like he's been hit by a truck.  Had several BMS and tolerating clears.  Up to bathroom several times.  No n/v.    Objective:  Vital signs:  Filed Vitals:   01/15/15 2134 01/15/15 2223 01/16/15 0620 01/16/15 0817  BP:  124/48 120/60   Pulse: 69 74 72   Temp:  98.5 F (36.9 C) 97.8 F (36.6 C)   TempSrc:  Oral Oral   Resp: 18 18 18    Height:      Weight:      SpO2: 98% 98% 97% 95%    Last BM Date: 01/15/15  Intake/Output   Yesterday:  07/12 0701 - 07/13 0700 In: 530 [P.O.:480; I.V.:50] Out: 775 [Urine:775] This shift:     Physical Exam: General: Pt awake/alert/oriented x4 in no acute distress Abdomen: Soft. Nondistended. Midline wound-vac in place with serosanguinous output. Mildly tender at incisions only. No evidence of peritonitis. No incarcerated hernias.   Problem List:   Principal Problem:   Incisional hernia with gangrene and obstruction s/p exlap/Sb resection & repair 01/11/2015 Active Problems:   Morbid obesity   Essential hypertension, benign   COPD exacerbation   Acute and chronic respiratory failure with hypoxia   Acute-on-chronic kidney injury   Atrial fibrillation   Pulmonary embolism   Chronic anticoagulation   Shock circulatory   Acute renal failure   Acute respiratory failure with hypoxia   Acute post-operative pain   COPD (chronic obstructive pulmonary disease)    Results:   Labs: Results for orders placed or performed during the hospital encounter of 01/11/15 (from the past 48 hour(s))  Basic metabolic panel     Status: Abnormal   Collection Time: 01/15/15  3:40 AM  Result Value Ref Range   Sodium 146 (H) 135 - 145 mmol/L   Potassium 3.7 3.5 - 5.1 mmol/L   Chloride 112 (H)  101 - 111 mmol/L   CO2 24 22 - 32 mmol/L   Glucose, Bld 96 65 - 99 mg/dL   BUN 32 (H) 6 - 20 mg/dL   Creatinine, Ser 1.66 (H) 0.61 - 1.24 mg/dL   Calcium 8.2 (L) 8.9 - 10.3 mg/dL   GFR calc non Af Amer 38 (L) >60 mL/min   GFR calc Af Amer 44 (L) >60 mL/min    Comment: (NOTE) The eGFR has been calculated using the CKD EPI equation. This calculation has not been validated in all clinical situations. eGFR's persistently <60 mL/min signify possible Chronic Kidney Disease.    Anion gap 10 5 - 15    Imaging / Studies: No results found.  Medications / Allergies:  Scheduled Meds: . antiseptic oral rinse  7 mL Mouth Rinse q12n4p  . arformoterol  15 mcg Nebulization BID  . budesonide (PULMICORT) nebulizer solution  0.5 mg Nebulization BID  . chlorhexidine  15 mL Mouth Rinse BID  . enoxaparin (LOVENOX) injection  1 mg/kg Subcutaneous Q12H  . lip balm  1 application Topical BID  . pantoprazole (PROTONIX) IV  40 mg Intravenous Q12H  . QUEtiapine  25 mg Oral QHS,MR X 1  . sertraline  50 mg Oral Daily   Continuous Infusions: . sodium chloride 10 mL/hr at 01/15/15  1037   PRN Meds:.alum & mag hydroxide-simeth, bisacodyl, fentaNYL (SUBLIMAZE) injection, ipratropium-albuterol, magic mouthwash, menthol-cetylpyridinium, phenol  Antibiotics: Anti-infectives    Start     Dose/Rate Route Frequency Ordered Stop   01/15/15 0430  vancomycin (VANCOCIN) 1,750 mg in sodium chloride 0.9 % 500 mL IVPB  Status:  Discontinued     1,750 mg 250 mL/hr over 120 Minutes Intravenous Every 48 hours 01/13/15 1254 01/14/15 0938   01/11/15 0700  vancomycin (VANCOCIN) 1,750 mg in sodium chloride 0.9 % 500 mL IVPB  Status:  Discontinued     1,750 mg 250 mL/hr over 120 Minutes Intravenous Daily 01/11/15 0648 01/13/15 1254   01/11/15 0330  piperacillin-tazobactam (ZOSYN) IVPB 3.375 g     3.375 g 12.5 mL/hr over 240 Minutes Intravenous 3 times per day 01/11/15 0321 01/16/15 0309        Assessment/Plan Strangulated incisional hernia with small bowel obstruction, 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 Dalbert Batman -stable.  -VAC change M-W-F.  Will eval wound today with vac change. -needs to mobilize -EI(3539) -DC foley ID-zosyn D#5/5 for peritonitis. Cultures negative to date. VTE prophylaxis-resume lovenox, may resume coumadin from surgical standpoint.  Hx PAF and PE on coumadin, resume CKD COPD FEN-advance to a soft diet. Add po pain meds.  Dispo-anticipate DC soon, therapies recommending SNF   Erby Pian, [facility] Pager [phone](7A-4:30P)   01/16/2015 8:36 AM

## 2015-01-16 NOTE — Progress Notes (Signed)
Physical Therapy Treatment Patient Details Name: Gary Boyer MRN: [id number] DOB: 03-Apr-1936 Today's Date: 01/16/2015    History of Present Illness 79 yo male transferred from [facility] hospital 01/11/15  with abdominal pain from incarcerated ventral hernia and SBO >> had SB resection with sub-total omentectomy 01/11/15, placement of  wound VAC and remained on Vent post-op. extubated 01/13/15.    PT Comments    Pt assisted with short distance ambulation today.  Pt with [place] off to side of face set on 5L and SPO2 93% upon entering room (basically room air).  Pt was placed on 2L O2 [place] for mobility and SpO2 94% during gait, and upon return to supine 93% (RN informed pt left and 2L O2, and pt aware to call out if feeling SOB).     Follow Up Recommendations  SNF;Supervision/Assistance - 24 hour     Equipment Recommendations  None recommended by PT    Recommendations for Other Services       Precautions / Restrictions Precautions Precautions: Fall Precaution Comments: wound VAC, monitor sats, on O2    Mobility  Bed Mobility Overal bed mobility: Needs Assistance Bed Mobility: Supine to Sit;Sit to Supine     Supine to sit: Min assist Sit to supine: Mod assist   General bed mobility comments: assist for trunk upright (pt reports difficulty rolling), assist for LEs onto bed  Transfers Overall transfer level: Needs assistance Equipment used: Rolling walker (2 wheeled) Transfers: Sit to/from Stand Sit to Stand: Min assist;+2 safety/equipment         General transfer comment: verbal cues for safe technique, tends to keep wide BOS   Ambulation/Gait Ambulation/Gait assistance: Min assist;+2 safety/equipment Ambulation Distance (Feet): 50 Feet Assistive device: Rolling walker (2 wheeled) Gait Pattern/deviations: Step-through pattern;Wide base of support;Trunk flexed     General Gait Details: verbal cues for posture, RW positioning, remained on 2L O2 [place] with SpO2 94% during gait, pt  reports 2/4 dyspnea   Stairs            Wheelchair Mobility    Modified Rankin (Stroke Patients Only)       Balance                                    Cognition Arousal/Alertness: Awake/alert Behavior During Therapy: WFL for tasks assessed/performed Overall Cognitive Status: Within Functional Limits for tasks assessed                      Exercises      General Comments        Pertinent Vitals/Pain Pain Assessment: Faces Faces Pain Scale: Hurts little more Pain Location: abdomen Pain Descriptors / Indicators: Grimacing;Discomfort Pain Intervention(s): Limited activity within patient's tolerance;Monitored during session    Home Living                      Prior Function            PT Goals (current goals can now be found in the care plan section) Progress towards PT goals: Progressing toward goals    Frequency  Min 3X/week    PT Plan Current plan remains appropriate    Co-evaluation             End of Session Equipment Utilized During Treatment: Oxygen Activity Tolerance: Patient tolerated treatment well Patient left: with call bell/phone within reach;in bed;with bed alarm set;with family/visitor  present     Time: 9798-9211 PT Time Calculation (min) (ACUTE ONLY): 22 min  Charges:  $Gait Training: 8-22 mins                    G Codes:      Sanari Offner,KATHrine E 01-29-15, 3:10 PM Carmelia Bake, PT, DPT Jan 29, 2015 Pager: [phone]

## 2015-01-16 NOTE — Progress Notes (Signed)
Pt placed on Auto CPAP 8-20 CMH20 via Nasal mask with 4 LPM O2 bleed in.  Pt tolerating well at this time, RT to monitor and assess as needed.

## 2015-01-16 NOTE — Progress Notes (Addendum)
ANTICOAGULATION CONSULT NOTE - Follow Up Consult  Pharmacy Consult for Lovenox >> warfarin Indication: Afib, Hx DVT  Allergies  Allergen Reactions  . Ciprofloxacin Diarrhea  . Morphine And Related     HALLUCINATIONS  . Zithromax [Azithromycin] Diarrhea    Patient Measurements: Height: 6\' 1"  (185.4 cm) Weight: 293 lb 3.4 oz (133 kg) IBW/kg (Calculated) : 79.9  Vital Signs: Temp: 97.8 F (36.6 C) (07/13 0620) Temp Source: Oral (07/13 0620) BP: 120/60 mmHg (07/13 0620) Pulse Rate: 72 (07/13 0620)  Labs:  Recent Labs  01/14/15 0340 01/15/15 0340  HGB 10.0*  --   HCT 32.4*  --   PLT 145*  --   CREATININE 1.81* 1.66*    Estimated Creatinine Clearance: 53.3 mL/min (by C-G formula based on Cr of 1.66).   Medications:  Prescriptions prior to admission  Medication Sig Dispense Refill Last Dose  . albuterol (PROVENTIL) (2.5 MG/3ML) 0.083% nebulizer solution Take 2.5 mg by nebulization every 6 (six) hours as needed for wheezing.   01/10/2015 at Unknown time  . amLODipine (NORVASC) 10 MG tablet Take 10 mg by mouth every morning.   01/10/2015 at Unknown time  . arformoterol (BROVANA) 15 MCG/2ML NEBU Take 15 mcg by nebulization 2 (two) times daily.   01/10/2015 at Unknown time  . budesonide (PULMICORT) 0.5 MG/2ML nebulizer solution Take 0.5 mg by nebulization 2 (two) times daily.   01/10/2015 at Unknown time  . cholecalciferol (VITAMIN D) 1000 UNITS tablet Take 1,000 Units by mouth daily.   01/10/2015 at Unknown time  . enalapril (VASOTEC) 10 MG tablet Take 10 mg by mouth 2 (two) times daily.   01/10/2015 at Unknown time  . furosemide (LASIX) 40 MG tablet Take 40 mg by mouth daily.   01/10/2015 at Unknown time  . ipratropium (ATROVENT) 0.02 % nebulizer solution Take 500 mcg by nebulization 4 (four) times daily.   01/10/2015 at Unknown time  . polyethylene glycol (MIRALAX / GLYCOLAX) packet Take 17 g by mouth daily. (Patient taking differently: Take 17 g by mouth once. ) 14 each  01/10/2015 at  Unknown time  . Potassium Citrate 15 MEQ (1620 MG) TBCR Take 1 tablet by mouth 2 (two) times daily.   01/10/2015 at Unknown time  . ranitidine (ZANTAC) 150 MG tablet Take 150 mg by mouth 2 (two) times daily.   01/10/2015 at Unknown time  . vitamin B-12 (CYANOCOBALAMIN) 1000 MCG tablet Take 1,000 mcg by mouth daily.   01/10/2015 at Unknown time  . warfarin (COUMADIN) 1 MG tablet Take 1 mg by mouth daily.   01/09/2015 at 0000  . warfarin (COUMADIN) 4 MG tablet Take 1 tablet (4 mg total) by mouth daily. Take with a 0.5 mg to make 4.5 mg (Patient taking differently: Take 4 mg by mouth daily. Take with a 1mg  to make 5 mg) 45 tablet 0 01/09/2015 at 0000  . ferrous sulfate 325 (65 FE) MG tablet Take 1 tablet (325 mg total) by mouth 3 (three) times daily after meals. (Patient not taking: Reported on 01/11/2015)     . guaiFENesin (MUCINEX) 600 MG 12 hr tablet Take 1 tablet (600 mg total) by mouth 2 (two) times daily as needed for congestion. (Patient not taking: Reported on 01/11/2015)     . HYDROcodone-acetaminophen (NORCO/VICODIN) 5-325 MG per tablet Take 1-2 tablets by mouth every 4 (four) hours as needed for pain. (Patient not taking: Reported on 01/11/2015) 100 tablet 0   . methocarbamol (ROBAXIN) 500 MG tablet Take 1 tablet (500  mg total) by mouth every 6 (six) hours as needed (muscle spasms). (Patient not taking: Reported on 01/11/2015) 50 tablet 0   . sertraline (ZOLOFT) 50 MG tablet Take 50 mg by mouth at bedtime.   01/09/2015   Scheduled:  . antiseptic oral rinse  7 mL Mouth Rinse q12n4p  . arformoterol  15 mcg Nebulization BID  . budesonide (PULMICORT) nebulizer solution  0.5 mg Nebulization BID  . chlorhexidine  15 mL Mouth Rinse BID  . enoxaparin (LOVENOX) injection  40 mg Subcutaneous Q24H  . lip balm  1 application Topical BID  . pantoprazole (PROTONIX) IV  40 mg Intravenous Q12H  . QUEtiapine  25 mg Oral QHS,MR X 1  . sertraline  50 mg Oral Daily  . warfarin  5 mg Oral Once  . Warfarin - Pharmacist Dosing  Inpatient   Does not apply q1800    Assessment: 79 yo male transferred from [facility] with abdominal pain from incarcerated ventral hernia and SBO >> had SB resection 7/8 with sub-total omentectomy, and remained on Vent post-op.  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 Patient currently receiving therapeutic-dose Lovenox; cleared by surgery today to resume warfarin.   Baseline INR 1.57 on 7/9  Prior anticoagulation: warfarin 5 mg daily, last dose 7/6  Today, 01/16/2015:  CBC: Hgb/Plt slightly low on 7/11  INR subtherapeutic as expected  Major drug interactions: none; to transition to ppx-dose Lovenox  No bleeding issues per nursing  Soft diet, intake not recorded yet  Goal of Therapy: INR 2-3  Plan:  Warfarin 7.5 mg PO tonight at 18:00 (1.5 x home dose)  Change Lovenox to 0.5 mg/kg SQ q24 hr (prophylactic dosing in obese patients with BMI > 30)  Daily INR  CBC at least q72 hr while on warfarin  Monitor for signs of bleeding or thrombosis   Reuel Boom, PharmD Pager: [phone] 01/16/2015, 9:34 AM

## 2015-01-16 NOTE — Clinical Social Work Note (Signed)
Clinical Social Work Assessment  Patient Details  Name: Gary Boyer MRN: [id number] Date of Birth: [birth year]  Date of referral:  01/16/15               Reason for consult:  Discharge Planning                Permission sought to share information with:  Family Supports Permission granted to share information::  Yes, Verbal Permission Granted  Name::     Leisure centre manager::     Relationship::  Wife  Contact Information:     Housing/Transportation Living arrangements for the past 2 months:  Single Family Home Source of Information:  Patient Patient Interpreter Needed:  None Criminal Activity/Legal Involvement Pertinent to Current Situation/Hospitalization:  No - Comment as needed Significant Relationships:  Spouse, Adult Children Lives with:  Spouse Do you feel safe going back to the place where you live?  No Need for family participation in patient care:  Yes (Comment)  Care giving concerns:  Patient agreeable to SNF placement because he does not feel wife can manage needs at home.   Social Worker assessment / plan:  CSW received referral to assist with DC planning. CSW reviewed chart and met with patient and wife at bedside. CSW introduced myself and explained role. Patient was living at home with wife prior to admission but is aware of PT recommendations for SNF placement. Patient has been to SNF in the past and aware of process.  CSW provided SNF list and explained process again. Patient has been to [facility] in the past and is agreeable to [facility] and [facility]. CSW completed FL2 and faxed out. CSW will follow up with bed offers.  Employment status:  Retired Forensic scientist:  Medicare PT Recommendations:  [place] / Referral to community resources:  [place]  Patient/Family's Response to care:  Patient engaged and agreeable to assessment.  Patient/Family's Understanding of and Emotional Response to Diagnosis,  Current Treatment, and Prognosis:  Patient reports he would prefer to return home but knows that the wound vac can require extra attention and would be agreeable to SNF to assist with needs.  Emotional Assessment Appearance:  Appears stated age Attitude/Demeanor/Rapport:  Other (Engaged) Affect (typically observed):  Appropriate Orientation:  Oriented to Self, Oriented to Place, Oriented to  Time, Oriented to Situation Alcohol / Substance use:  Not Applicable Psych involvement (Current and /or in the community):  No (Comment)  Discharge Needs  Concerns to be addressed:  No discharge needs identified Readmission within the last 30 days:  No Current discharge risk:  None Barriers to Discharge:  Continued Medical Work up   [employer], [place] 01/16/2015, 4:28 PM [phone]

## 2015-01-16 NOTE — Progress Notes (Signed)
Pt wants RT to come back at around 2230 to place CPAP on.  RT to monitor and assess as needed.

## 2015-01-16 NOTE — Care Management Note (Signed)
Case Management Note  Patient Details  Name: Gary Boyer MRN: [id number] Date of Birth: 25-Oct-1935  Subjective/Objective:                   abdominal pain from incarcerated ventral hernia with small bowel obstruction. Action/Plan:  Discharge  planning Expected Discharge Date:  01/17/15               Expected Discharge Plan:  [place]  In-House Referral:     Discharge planning Services  CM Consult  Post Acute Care Choice:    Choice offered to:     DME Arranged:    DME Agency:     HH Arranged:    [place] Agency:     Status of Service:  In process, will continue to follow  Medicare Important Message Given:  Yes-second notification given Date Medicare IM Given:    Medicare IM give by:    Date Additional Medicare IM Given:    Additional Medicare Important Message give by:     If discussed at [place] of Stay Meetings, dates discussed:    Additional Comments: CM notes plan is for pt to go to SNF post discharge.  CSW aware. CM will follow for disposition changes. Dellie Catholic, RN 01/16/2015, 2:27 PM

## 2015-01-16 NOTE — Clinical Social Work Placement (Signed)
   CLINICAL SOCIAL WORK PLACEMENT  NOTE  Date:  01/16/2015  Patient Details  Name: Gary Boyer MRN: [id number] Date of Birth: 1936-01-29  Clinical Social Work is seeking post-discharge placement for this patient at the [place] level of care (*CSW will initial, date and re-position this form in  chart as items are completed):  Yes   Patient/family provided with [facility] Work Department's list of facilities offering this level of care within the geographic area requested by the patient (or if unable, by the patient's family).  Yes   Patient/family informed of their freedom to choose among providers that offer the needed level of care, that participate in Medicare, Medicaid or managed care program needed by the patient, have an available bed and are willing to accept the patient.  Yes   Patient/family informed of [facility]'s ownership interest in [facility] and [facility], as well as of the fact that they are under no obligation to receive care at these facilities.  PASRR submitted to EDS on       PASRR number received on       Existing PASRR number confirmed on 01/16/15     FL2 transmitted to all facilities in geographic area requested by pt/family on 01/16/15     FL2 transmitted to all facilities within larger geographic area on       Patient informed that his/her managed care company has contracts with or will negotiate with certain facilities, including the following:            Patient/family informed of bed offers received.  Patient chooses bed at       Physician recommends and patient chooses bed at      Patient to be transferred to   on  .  Patient to be transferred to facility by       Patient family notified on   of transfer.  Name of family member notified:        PHYSICIAN       Additional Comment:    _______________________________________________ Boone Master, [place] 01/16/2015, 4:26 PM

## 2015-01-17 LAB — CBC
HEMATOCRIT: 30.7 % — AB (ref 39.0–52.0)
HEMOGLOBIN: 9.6 g/dL — AB (ref 13.0–17.0)
MCH: 27.7 pg (ref 26.0–34.0)
MCHC: 31.3 g/dL (ref 30.0–36.0)
MCV: 88.7 fL (ref 78.0–100.0)
Platelets: 139 10*3/uL — ABNORMAL LOW (ref 150–400)
RBC: 3.46 MIL/uL — AB (ref 4.22–5.81)
RDW: 14.5 % (ref 11.5–15.5)
WBC: 5.3 10*3/uL (ref 4.0–10.5)

## 2015-01-17 LAB — CLOSTRIDIUM DIFFICILE BY PCR: CDIFFPCR: NEGATIVE

## 2015-01-17 LAB — PROTIME-INR
INR: 1.43 (ref 0.00–1.49)
Prothrombin Time: 17.5 seconds — ABNORMAL HIGH (ref 11.6–15.2)

## 2015-01-17 MED ORDER — WARFARIN SODIUM 7.5 MG PO TABS: 7.5000 mg | ORAL_TABLET | Freq: Every day | ORAL | Status: DC

## 2015-01-17 MED ORDER — HYDROCODONE-ACETAMINOPHEN 5-325 MG PO TABS: 1.0000 | ORAL_TABLET | Freq: Four times a day (QID) | ORAL | Status: DC | PRN

## 2015-01-17 MED ORDER — ALUM & MAG HYDROXIDE-SIMETH 200-200-20 MG/5ML PO SUSP: 30.0000 mL | Freq: Four times a day (QID) | ORAL | Status: AC | PRN

## 2015-01-17 MED ORDER — FUROSEMIDE 10 MG/ML IJ SOLN
40.0000 mg | Freq: Once | INTRAMUSCULAR | Status: AC
  Administered 2015-01-17: 40 mg via INTRAVENOUS
  Filled 2015-01-17: qty 4

## 2015-01-17 MED ORDER — PANTOPRAZOLE SODIUM 40 MG PO TBEC
40.0000 mg | DELAYED_RELEASE_TABLET | Freq: Two times a day (BID) | ORAL | Status: DC
  Administered 2015-01-17: 40 mg via ORAL
  Filled 2015-01-17: qty 1

## 2015-01-17 MED ORDER — WARFARIN SODIUM 7.5 MG PO TABS
7.5000 mg | ORAL_TABLET | Freq: Once | ORAL | Status: AC
  Filled 2015-01-17: qty 1

## 2015-01-17 NOTE — Progress Notes (Signed)
Gave report to Sharyn Lull, Therapist, sports at [employer]. Left number if she had additional questions.

## 2015-01-17 NOTE — Progress Notes (Signed)
Patient ID: Gary Boyer, male   DOB: [birth year], 79 y.o.   MRN: [id number]     [facility]      [address] [place], [place] [address]    Phone: [phone] FAX: [phone]     Subjective: Tolerating POs.  C/o watery stools.  No n/v. Afebrile.   Objective:  Vital signs:  Filed Vitals:   01/16/15 1942 01/16/15 2050 01/17/15 0554 01/17/15 0909  BP:  140/69 119/53   Pulse:  71 67   Temp:  98.1 F (36.7 C) 97.9 F (36.6 C)   TempSrc:  Oral Oral   Resp:  22 20   Height:      Weight:      SpO2: 89% 94% 95% 94%    Last BM Date: 01/16/15  Intake/Output   Yesterday:  07/13 0701 - 07/14 0700 In: 840 [P.O.:840] Out: 1000 [Urine:1000] This shift:  Total I/O In: -  Out: 250 [Urine:250]   Physical Exam: General: Pt awake/alert/oriented x4 in no acute distress Abdomen: Soft. Nondistended. Midline wound-vac in place with serosanguinous output. Mildly tender at incisions only. No evidence of peritonitis. No incarcerated hernias.  Problem List:   Principal Problem:   Incisional hernia with gangrene and obstruction s/p exlap/Sb resection & repair 01/11/2015 Active Problems:   Morbid obesity   Essential hypertension, benign   COPD exacerbation   Acute and chronic respiratory failure with hypoxia   Acute-on-chronic kidney injury   Atrial fibrillation   Pulmonary embolism   Chronic anticoagulation   Shock circulatory   Acute renal failure   Acute respiratory failure with hypoxia   Acute post-operative pain   COPD (chronic obstructive pulmonary disease)    Results:   Labs: Results for orders placed or performed during the hospital encounter of 01/11/15 (from the past 48 hour(s))  Clostridium Difficile by PCR (not at [facility])     Status: None   Collection Time: 01/16/15 11:29 PM  Result Value Ref Range   C difficile by pcr NEGATIVE NEGATIVE  Protime-INR     Status: Abnormal   Collection Time: 01/17/15  5:05 AM   Result Value Ref Range   Prothrombin Time 17.5 (H) 11.6 - 15.2 seconds   INR 1.43 0.00 - 1.49  CBC     Status: Abnormal   Collection Time: 01/17/15  5:05 AM  Result Value Ref Range   WBC 5.3 4.0 - 10.5 K/uL   RBC 3.46 (L) 4.22 - 5.81 MIL/uL   Hemoglobin 9.6 (L) 13.0 - 17.0 g/dL   HCT 30.7 (L) 39.0 - 52.0 %   MCV 88.7 78.0 - 100.0 fL   MCH 27.7 26.0 - 34.0 pg   MCHC 31.3 30.0 - 36.0 g/dL   RDW 14.5 11.5 - 15.5 %   Platelets 139 (L) 150 - 400 K/uL    Imaging / Studies: No results found.  Medications / Allergies:  Scheduled Meds: . antiseptic oral rinse  7 mL Mouth Rinse q12n4p  . arformoterol  15 mcg Nebulization BID  . budesonide (PULMICORT) nebulizer solution  0.5 mg Nebulization BID  . chlorhexidine  15 mL Mouth Rinse BID  . enoxaparin (LOVENOX) injection  65 mg Subcutaneous Q24H  . furosemide  40 mg Intravenous Once  . lip balm  1 application Topical BID  . pantoprazole  40 mg Oral BID  . QUEtiapine  25 mg Oral QHS,MR X 1  . sertraline  50 mg Oral Daily  . [COMPLETED] warfarin  7.5  mg Oral ONCE-1800  . Warfarin - Pharmacist Dosing Inpatient   Does not apply q1800   Continuous Infusions: . sodium chloride 10 mL/hr at 01/15/15 1037   PRN Meds:.alum & mag hydroxide-simeth, bisacodyl, fentaNYL (SUBLIMAZE) injection, HYDROcodone-acetaminophen, ipratropium-albuterol, magic mouthwash, menthol-cetylpyridinium, phenol  Antibiotics: Anti-infectives    Start     Dose/Rate Route Frequency Ordered Stop   01/15/15 0430  vancomycin (VANCOCIN) 1,750 mg in sodium chloride 0.9 % 500 mL IVPB  Status:  Discontinued     1,750 mg 250 mL/hr over 120 Minutes Intravenous Every 48 hours 01/13/15 1254 01/14/15 0938   01/11/15 0700  vancomycin (VANCOCIN) 1,750 mg in sodium chloride 0.9 % 500 mL IVPB  Status:  Discontinued     1,750 mg 250 mL/hr over 120 Minutes Intravenous Daily 01/11/15 0648 01/13/15 1254   01/11/15 0330  piperacillin-tazobactam (ZOSYN) IVPB 3.375 g     3.375 g 12.5 mL/hr  over 240 Minutes Intravenous 3 times per day 01/11/15 0321 01/16/15 0309      Assessment/Plan Strangulated incisional hernia with small bowel obstruction, 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 Dalbert Boyer -stable.  -I opened the wound as it was granulating and VAC sponge not being packed.  I think it would be better to DC VAC and start BID wet to dry dressing changes for concerns that it will continue to be placed inadequately as he is transitioned to a SNF.  -mobilize -TI(1443) Hx PAF and PE on coumadin CKD COPD FEN-advance to a soft diet. Add po pain meds.  Dispo-stable for DC from surgical standpoint. therapies recommending SNF.  I have arranged his follow up.    Gary Boyer, [facility] Pager [phone]-[phone])   01/17/2015 10:43 AM

## 2015-01-17 NOTE — Discharge Instructions (Signed)

## 2015-01-17 NOTE — Discharge Summary (Signed)
Physician Discharge Summary  Gary Boyer ZOX:[id number] DOB: 08-22-1935 DOA: 01/11/2015  PCP: No primary care provider on file.  Admit date: 01/11/2015 Discharge date: 01/17/2015  Time spent: 35 minutes  Recommendations for Outpatient Follow-up:  1. Discharge skilled nursing facility 2. Follow up with [facility] (Dr. Dalbert Batman) on 01/30/2015 3. Please monitor INR daily until therapeutic and adjust Coumadin dose.  Discharge Diagnoses:  Principal Problem:   Incisional hernia with gangrene and obstruction s/p exlap/Sb resection & repair 01/11/2015  Active Problems:   Morbid obesity   Essential hypertension, benign   COPD exacerbation   Acute and chronic respiratory failure with hypoxia   Acute-on-chronic kidney injury   Atrial fibrillation   Pulmonary embolism   Chronic anticoagulation   Shock circulatory   Acute respiratory failure with hypoxia    Discharge Condition: Fair  Diet recommendation: Soft  Filed Weights   01/13/15 0400 01/14/15 0400 01/15/15 0535  Weight: 135.5 kg (298 lb 11.6 oz) 135.9 kg (299 lb 9.7 oz) 133 kg (293 lb 3.4 oz)    History of present illness:  Please refer to admission H&P for details, in brief,79 year old male with history of hypertension, COPD, chronic kidney disease, GERD, OSA , A. fib on Coumadin, history of PE who was transferred from [facility] after being admitted with abdominal pain from incarcerated ventral hernia with small bowel obstruction. Patient underwent small bowel resection with subtotal omentectomy . He was monitored in ICU on vent postop and then transferred to hospitalist service.  Hospital Course:  Strangulated incisional hernia with small bowel obstruction, infarcted small bowel and omentum without perforation Patient underwent exploratory laparotomy with small bowel resection, subtotal omentectomy and repair of strangulated incisional hernia. -Patient placed on wound VAC which was removed today with recommendations for  twice-daily wet-to-dry dressings. -Completed 5 day course of IV Zosyn for peritonitis. -Tolerating soft diet and having several bowel movements. Pain controlled with when necessary Vicodin. -Patient seen by physical therapy and recommend skilled nursing facility. He is stable for discharge today. -he should follow-up with surgery on 7/27.  Active problems COPD exacerbation Symptoms resolved. Continue Pulmicort and Brovana nebs.  A. fib Rate controlled. Coumadin resumed. Bridging with Lovenox since INR is subtherapeutic. Coumadin dose increased to 7.5 mg daily and INR should be monitored daily until therapeutic.   acute on Chronic kidney disease stage III Renal function appears to be improved to baseline.  Essential hypertension Blood Pressure stable. Continue home blood pressure medications.  Depression Continue Zoloft and Seroquel.  Iron deficiency anemia Continue iron supplements  History of PE in ?2012 On Coumadin.      Code Status: Full code Family Communication: None at bedside Disposition Plan: SNF    Consultants:  CCS  Procedures:  Laparotomy 7/8  Antibiotics:  Completed 5 day course of IV Zosyn on 7/13   Discharge Exam: Filed Vitals:   01/17/15 0554  BP: 119/53  Pulse: 67  Temp: 97.9 F (36.6 C)  Resp: 20    General: Elderly male in no acute distress  HEENT: No pallor, moist oral mucosa, supple neck  Chest: Clear to auscultation bilaterally, no added sounds  CVS: S1 and S2  irregularly irregular, no murmurs rub or gallop  GI: wound vac removed with dressing applied, bowel sounds present, soft, nondistended, nontender  Musculoskeletal : Warm, no edema  CNS: Alert and oriented  Discharge Instructions    Current Discharge Medication List    START taking these medications   Details  alum & mag hydroxide-simeth (MAALOX/MYLANTA) 200-200-20  MG/5ML suspension Take 30 mLs by mouth every 6 (six) hours as needed for indigestion or  heartburn (or bloating). Qty: 355 mL, Refills: 0      CONTINUE these medications which have CHANGED   Details  HYDROcodone-acetaminophen (NORCO/VICODIN) 5-325 MG per tablet Take 1 tablet by mouth every 6 (six) hours as needed for moderate pain. Qty: 30 tablet, Refills: 0    warfarin (COUMADIN) 7.5 MG tablet Take 1 tablet (7.5 mg total) by mouth daily. Take 1 tablet daily after dinner. Please check INR daily until therapeutic and adjust dose Qty: 10 tablet, Refills: 0      CONTINUE these medications which have NOT CHANGED   Details  albuterol (PROVENTIL) (2.5 MG/3ML) 0.083% nebulizer solution Take 2.5 mg by nebulization every 6 (six) hours as needed for wheezing.    amLODipine (NORVASC) 10 MG tablet Take 10 mg by mouth every morning.    arformoterol (BROVANA) 15 MCG/2ML NEBU Take 15 mcg by nebulization 2 (two) times daily.    budesonide (PULMICORT) 0.5 MG/2ML nebulizer solution Take 0.5 mg by nebulization 2 (two) times daily.    cholecalciferol (VITAMIN D) 1000 UNITS tablet Take 1,000 Units by mouth daily.    enalapril (VASOTEC) 10 MG tablet Take 10 mg by mouth 2 (two) times daily.    furosemide (LASIX) 40 MG tablet Take 40 mg by mouth daily.    ipratropium (ATROVENT) 0.02 % nebulizer solution Take 500 mcg by nebulization 4 (four) times daily.    polyethylene glycol (MIRALAX / GLYCOLAX) packet Take 17 g by mouth daily. Qty: 14 each    Potassium Citrate 15 MEQ (1620 MG) TBCR Take 1 tablet by mouth 2 (two) times daily.    ranitidine (ZANTAC) 150 MG tablet Take 150 mg by mouth 2 (two) times daily.    vitamin B-12 (CYANOCOBALAMIN) 1000 MCG tablet Take 1,000 mcg by mouth daily.    ferrous sulfate 325 (65 FE) MG tablet Take 1 tablet (325 mg total) by mouth 3 (three) times daily after meals.    guaiFENesin (MUCINEX) 600 MG 12 hr tablet Take 1 tablet (600 mg total) by mouth 2 (two) times daily as needed for congestion.    sertraline (ZOLOFT) 50 MG tablet Take 50 mg by mouth at  bedtime.      STOP taking these medications     methocarbamol (ROBAXIN) 500 MG tablet        Allergies  Allergen Reactions  . Ciprofloxacin Diarrhea  . Morphine And Related     HALLUCINATIONS  . Zithromax [Azithromycin] Diarrhea   Follow-up Information    Follow up with Adin Hector, MD On 01/30/2015.   Specialty:  General Surgery   Why:  arrive by 3PM for a 3:30PM post operative check with your surgeon.   Contact information:   [address] [place] [address] [phone]        The results of significant diagnostics from this hospitalization (including imaging, microbiology, ancillary and laboratory) are listed below for reference.    Significant Diagnostic Studies: Dg Chest Port 1 View  01/13/2015   CLINICAL DATA:  Postop from hernia repair and small bowel resection. Acute on chronic respiratory failure with hypoxia. COPD exacerbation. On ventilator.  EXAM: PORTABLE CHEST - 1 VIEW  COMPARISON:  01/12/2015  FINDINGS: Endotracheal tube and nasogastric tube remain in place. Cardiomegaly and diffuse pulmonary interstitial prominence are stable. Atelectasis or infiltrate in the left retrocardiac lung base shows no significant change. No pneumothorax visualized.  IMPRESSION: No significant  change in left retrocardiac atelectasis versus infiltrate.  Stable cardiomegaly and diffuse interstitial prominence.   Electronically Signed   By: Earle Gell M.D.   On: 01/13/2015 07:35   Dg Chest Port 1 View  01/12/2015   CLINICAL DATA:  Acute on chronic respiratory failure with hypoxia. On ventilator. COPD. Pulmonary embolism. Acute on chronic kidney injury.  EXAM: PORTABLE CHEST - 1 VIEW  COMPARISON:  01/11/2015  FINDINGS: Cardiomegaly and ectasia of the thoracic aorta remains stable allowing for differences in patient positioning. Support lines and tubes in appropriate position. No pneumothorax visualized.  There is persistent opacity in the left retrocardiac lung base, which  may be due to atelectasis or infiltrate. Right lung remains grossly clear.  IMPRESSION: Stable low lung volumes with left retrocardiac atelectasis versus infiltrate.  Stable cardiomegaly and ectatic thoracic aorta.   Electronically Signed   By: Earle Gell M.D.   On: 01/12/2015 09:39   Dg Chest Port 1 View  01/11/2015   CLINICAL DATA:  Acute and chronic respiratory failure with hypoxia. COPD. Endotracheal tube placement.  EXAM: PORTABLE CHEST - 1 VIEW  COMPARISON:  01/10/2015  FINDINGS: Endotracheal tube tip is seen approximately 5 cm above carina. Nasogastric tube is seen extending into the stomach.  Low lung volumes are noted. There is bibasilar atelectasis which is increased since previous study. Mild cardiomegaly remains stable.  IMPRESSION: The endotracheal tube and nasogastric tube in appropriate position.  Low lung volumes with bibasilar atelectasis.   Electronically Signed   By: Earle Gell M.D.   On: 01/11/2015 16:30   Dg Abd Acute W/chest  01/11/2015   CLINICAL DATA:  Shortness of breath.  NG tube placement.  EXAM: DG ABDOMEN ACUTE W/ 1V CHEST  COMPARISON:  Chest x-ray and CT scan dated 01/10/2015  FINDINGS: NG tube is coiled in the back of the oropharynx and proximal esophagus and needs to be pulled out and repositioned.  Heart size and pulmonary vascularity are normal. COPD. Scarring at the lung bases.  There is persistent small bowel dilatation with stairstep air-fluid levels consistent with small bowel obstruction. The stomach is distended with fluid.  Extensive calcifications in both kidneys.  Severe degenerative disc disease in the mid lumbar spine.  No free air.  No acute osseous abnormality.  IMPRESSION: 1. The NG tube is coiled in the back of the oropharynx and needs to be removed and repositioned. 2. Persistent small bowel obstruction.  Fluid distended stomach. 3. Critical Value/emergent results were called by telephone at the time of interpretation on 01/11/2015 at 7:32 am to [employer], [place], who  verbally acknowledged these results.   Electronically Signed   By: Lorriane Shire M.D.   On: 01/11/2015 07:33    Microbiology: Recent Results (from the past 240 hour(s))  Culture, blood (routine x 2)     Status: None   Collection Time: 01/11/15  6:50 AM  Result Value Ref Range Status   Specimen Description BLOOD RIGHT HAND  Final   Special Requests BOTTLES DRAWN AEROBIC ONLY 3CC  Final   Culture   Final    NO GROWTH 5 DAYS Performed at [facility]    Report Status 01/16/2015 FINAL  Final  Culture, blood (routine x 2)     Status: None   Collection Time: 01/11/15  6:50 AM  Result Value Ref Range Status   Specimen Description BLOOD RIGHT ARM  Final   Special Requests BOTTLES DRAWN AEROBIC AND ANAEROBIC 5CC  Final   Culture  Final    NO GROWTH 5 DAYS Performed at [facility]    Report Status 01/16/2015 FINAL  Final  Surgical pcr screen     Status: None   Collection Time: 01/11/15  6:59 AM  Result Value Ref Range Status   MRSA, PCR NEGATIVE NEGATIVE Final   Staphylococcus aureus NEGATIVE NEGATIVE Final    Comment:        The Xpert SA Assay (FDA approved for NASAL specimens in patients over 73 years of age), is one component of a comprehensive surveillance program.  Test performance has been validated by [facility] for patients greater than or equal to 44 year old. It is not intended to diagnose infection nor to guide or monitor treatment.   Anaerobic culture     Status: None   Collection Time: 01/11/15  1:41 PM  Result Value Ref Range Status   Specimen Description WOUND INCARCERATED VENTRAL HERNIA  Final   Special Requests PATIENT ON FOLLOWING VANCOMYCIN AND ZOSYN  Final   Gram Stain   Final    RARE WBC PRESENT, PREDOMINANTLY MONONUCLEAR NO SQUAMOUS EPITHELIAL CELLS SEEN NO ORGANISMS SEEN Performed at [facility]    Culture   Final    NO ANAEROBES ISOLATED Performed at [facility]    Report Status 01/16/2015 FINAL  Final  Wound  culture     Status: None   Collection Time: 01/11/15  1:41 PM  Result Value Ref Range Status   Specimen Description WOUND INCARCERATED VENTRAL HERNIA  Final   Special Requests PATIENT ON FOLLOWING VANCOMYCIN AND ZOSYN  Final   Gram Stain   Final    RARE WBC PRESENT, PREDOMINANTLY MONONUCLEAR NO SQUAMOUS EPITHELIAL CELLS SEEN NO ORGANISMS SEEN Performed at [facility]    Culture   Final    NO GROWTH 2 DAYS Performed at [facility]    Report Status 01/14/2015 FINAL  Final  Clostridium Difficile by PCR (not at [facility])     Status: None   Collection Time: 01/16/15 11:29 PM  Result Value Ref Range Status   C difficile by pcr NEGATIVE NEGATIVE Final     Labs: Basic Metabolic Panel:  Recent Labs Lab 01/12/15 0622 01/12/15 2155 01/13/15 0418 01/14/15 0340 01/15/15 0340  NA 139 142 142 142 146*  K 4.1 4.2 3.7 3.7 3.7  CL 101 111 110 111 112*  CO2 28 22 24 23 24   GLUCOSE 115* 97 101* 97 96  BUN 37* 36* 35* 33* 32*  CREATININE 3.10* 2.52* 2.22* 1.81* 1.66*  CALCIUM 7.0* 7.2* 7.4* 7.8* 8.2*  MG 1.8  --  1.8 2.3  --   PHOS 4.6  --  3.5 3.2  --    Liver Function Tests:  Recent Labs Lab 01/11/15 0325 01/12/15 0622 01/13/15 0418  AST 17 71* 85*  ALT 11* 16* 19  ALKPHOS 63 42 42  BILITOT 1.6* 1.8* 2.1*  PROT 8.4* 6.8 6.1*  ALBUMIN 4.1 3.0* 2.6*   No results for input(s): LIPASE, AMYLASE in the last 168 hours. No results for input(s): AMMONIA in the last 168 hours. CBC:  Recent Labs Lab 01/11/15 0325  01/11/15 1630 01/12/15 0622 01/13/15 0418 01/14/15 0340 01/17/15 0505  WBC 14.5*  < > 3.6* 11.1* 9.1 7.1 5.3  NEUTROABS 12.6*  --   --   --  6.8 5.5  --   HGB 14.4  < > 12.0* 12.0* 11.4* 10.0* 9.6*  HCT 43.4  < > 37.8* 38.1* 36.6*  32.4* 30.7*  MCV 87.7  < > 90.0 91.8 91.7 91.8 88.7  PLT 194  < > 136* 171 155 145* 139*  < > = values in this interval not displayed. Cardiac Enzymes:  Recent Labs Lab 01/11/15 1630 01/12/15 0622  TROPONINI <0.03  0.04*   BNP: BNP (last 3 results)  Recent Labs  01/11/15 0325  BNP 119.7*    ProBNP (last 3 results) No results for input(s): PROBNP in the last 8760 hours.  CBG: No results for input(s): GLUCAP in the last 168 hours.     SignedLouellen Molder  [facility] 01/17/2015, 12:55 PM

## 2015-01-17 NOTE — Progress Notes (Signed)
Pharmacy IV to PO conversion  The patient is receiving pantoprazole by the intravenous route.  Based on criteria approved by the Pharmacy and [facility], the medication is being converted to the equivalent oral dose form.   No active GI bleeding or impaired absorption  Not s/p esophagectomy  Documented ability to take oral medications for > 24 hr  Plan to continue treatment for at least 1 day  If you have any questions about this conversion, please contact the Pharmacy Department (ext [phone]).  Thank you.  Reuel Boom, PharmD Pager: [phone] 01/17/2015, 9:44 AM

## 2015-01-17 NOTE — Progress Notes (Signed)
ANTICOAGULATION CONSULT NOTE - Follow Up Consult  Pharmacy Consult for Lovenox >> warfarin Indication: Afib, Hx DVT  Allergies  Allergen Reactions  . Ciprofloxacin Diarrhea  . Morphine And Related     HALLUCINATIONS  . Zithromax [Azithromycin] Diarrhea    Patient Measurements: Height: 6\' 1"  (185.4 cm) Weight: 293 lb 3.4 oz (133 kg) IBW/kg (Calculated) : 79.9  Vital Signs: Temp: 97.9 F (36.6 C) (07/14 0554) Temp Source: Oral (07/14 0554) BP: 119/53 mmHg (07/14 0554) Pulse Rate: 67 (07/14 0554)  Labs:  Recent Labs  01/15/15 0340 01/17/15 0505  HGB  --  9.6*  HCT  --  30.7*  PLT  --  139*  LABPROT  --  17.5*  INR  --  1.43  CREATININE 1.66*  --     Estimated Creatinine Clearance: 53.3 mL/min (by C-G formula based on Cr of 1.66).   Medications:  Prescriptions prior to admission  Medication Sig Dispense Refill Last Dose  . albuterol (PROVENTIL) (2.5 MG/3ML) 0.083% nebulizer solution Take 2.5 mg by nebulization every 6 (six) hours as needed for wheezing.   01/10/2015 at Unknown time  . amLODipine (NORVASC) 10 MG tablet Take 10 mg by mouth every morning.   01/10/2015 at Unknown time  . arformoterol (BROVANA) 15 MCG/2ML NEBU Take 15 mcg by nebulization 2 (two) times daily.   01/10/2015 at Unknown time  . budesonide (PULMICORT) 0.5 MG/2ML nebulizer solution Take 0.5 mg by nebulization 2 (two) times daily.   01/10/2015 at Unknown time  . cholecalciferol (VITAMIN D) 1000 UNITS tablet Take 1,000 Units by mouth daily.   01/10/2015 at Unknown time  . enalapril (VASOTEC) 10 MG tablet Take 10 mg by mouth 2 (two) times daily.   01/10/2015 at Unknown time  . furosemide (LASIX) 40 MG tablet Take 40 mg by mouth daily.   01/10/2015 at Unknown time  . ipratropium (ATROVENT) 0.02 % nebulizer solution Take 500 mcg by nebulization 4 (four) times daily.   01/10/2015 at Unknown time  . polyethylene glycol (MIRALAX / GLYCOLAX) packet Take 17 g by mouth daily. (Patient taking differently: Take 17 g by mouth  once. ) 14 each  01/10/2015 at Unknown time  . Potassium Citrate 15 MEQ (1620 MG) TBCR Take 1 tablet by mouth 2 (two) times daily.   01/10/2015 at Unknown time  . ranitidine (ZANTAC) 150 MG tablet Take 150 mg by mouth 2 (two) times daily.   01/10/2015 at Unknown time  . vitamin B-12 (CYANOCOBALAMIN) 1000 MCG tablet Take 1,000 mcg by mouth daily.   01/10/2015 at Unknown time  . warfarin (COUMADIN) 1 MG tablet Take 1 mg by mouth daily.   01/09/2015 at 0000  . warfarin (COUMADIN) 4 MG tablet Take 1 tablet (4 mg total) by mouth daily. Take with a 0.5 mg to make 4.5 mg (Patient taking differently: Take 4 mg by mouth daily. Take with a 1mg  to make 5 mg) 45 tablet 0 01/09/2015 at 0000  . ferrous sulfate 325 (65 FE) MG tablet Take 1 tablet (325 mg total) by mouth 3 (three) times daily after meals. (Patient not taking: Reported on 01/11/2015)     . guaiFENesin (MUCINEX) 600 MG 12 hr tablet Take 1 tablet (600 mg total) by mouth 2 (two) times daily as needed for congestion. (Patient not taking: Reported on 01/11/2015)     . HYDROcodone-acetaminophen (NORCO/VICODIN) 5-325 MG per tablet Take 1-2 tablets by mouth every 4 (four) hours as needed for pain. (Patient not taking: Reported on 01/11/2015) 100  tablet 0   . methocarbamol (ROBAXIN) 500 MG tablet Take 1 tablet (500 mg total) by mouth every 6 (six) hours as needed (muscle spasms). (Patient not taking: Reported on 01/11/2015) 50 tablet 0   . sertraline (ZOLOFT) 50 MG tablet Take 50 mg by mouth at bedtime.   01/09/2015   Scheduled:  . antiseptic oral rinse  7 mL Mouth Rinse q12n4p  . arformoterol  15 mcg Nebulization BID  . budesonide (PULMICORT) nebulizer solution  0.5 mg Nebulization BID  . chlorhexidine  15 mL Mouth Rinse BID  . enoxaparin (LOVENOX) injection  65 mg Subcutaneous Q24H  . furosemide  40 mg Intravenous Once  . lip balm  1 application Topical BID  . pantoprazole (PROTONIX) IV  40 mg Intravenous Q12H  . QUEtiapine  25 mg Oral QHS,MR X 1  . sertraline  50 mg Oral  Daily  . Warfarin - Pharmacist Dosing Inpatient   Does not apply q1800    Assessment: 79 yo male transferred from [facility] with abdominal pain from incarcerated ventral hernia and SBO >> had SB resection 7/8 with sub-total omentectomy, and remained on Vent post-op.  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 Patient currently receiving therapeutic-dose Lovenox; cleared by surgery today to resume warfarin.   Baseline INR 1.57 on 7/9  Prior anticoagulation: warfarin 5 mg daily, last dose 7/6  Today, 01/17/2015:  CBC: Hgb/Plt low but appear stable since 7/11  INR remains subtherapeutic as expected after warfarin x 1  Major drug interactions: none; ppx-dose Lovenox  No bleeding issues per nursing  Soft diet, intake not recorded yet  Goal of Therapy: INR 2-3  Plan:  Repeat warfarin 7.5 mg PO tonight at 18:00  Lovenox to 0.5 mg/kg SQ q24 hr (prophylactic dosing in obese patients with BMI > 30)  Daily INR  CBC at least q72 hr while on warfarin  Monitor for signs of bleeding or thrombosis   Reuel Boom, PharmD Pager: [phone] 01/17/2015, 9:31 AM

## 2015-01-17 NOTE — Progress Notes (Signed)
Clinical Social Work  CSW faxed DC summary to [facility] who is agreeable to accept patient today. Patient and wife agreeable and happy to have their first choice. CSW prepared DC packet with FL2, DC summary and hard scripts included. CSW arranged [facility] per family request.  RN to call report to SNF. CSW is signing off but available if needed.  [place], [place] [phone]

## 2015-01-17 NOTE — Clinical Social Work Placement (Signed)
   CLINICAL SOCIAL WORK PLACEMENT  NOTE  Date:  01/17/2015  Patient Details  Name: Gary Boyer MRN: [id number] Date of Birth: 1936/01/10  Clinical Social Work is seeking post-discharge placement for this patient at the [place] level of care (*CSW will initial, date and re-position this form in  chart as items are completed):  Yes   Patient/family provided with [facility] Work Department's list of facilities offering this level of care within the geographic area requested by the patient (or if unable, by the patient's family).  Yes   Patient/family informed of their freedom to choose among providers that offer the needed level of care, that participate in Medicare, Medicaid or managed care program needed by the patient, have an available bed and are willing to accept the patient.  Yes   Patient/family informed of [facility]'s ownership interest in [facility] and [facility], as well as of the fact that they are under no obligation to receive care at these facilities.  PASRR submitted to EDS on       PASRR number received on       Existing PASRR number confirmed on 01/16/15     FL2 transmitted to all facilities in geographic area requested by pt/family on 01/16/15     FL2 transmitted to all facilities within larger geographic area on       Patient informed that his/her managed care company has contracts with or will negotiate with certain facilities, including the following:        Yes   Patient/family informed of bed offers received.  Patient chooses bed at [facility]     Physician recommends and patient chooses bed at      Patient to be transferred to [facility] on 01/17/15.  Patient to be transferred to facility by [facility]     Patient family notified on 01/17/15 of transfer.  Name of family member notified:  Wife-Elsie     PHYSICIAN       Additional Comment:     _______________________________________________ Boone Master, Gary Boyer 01/17/2015, 3:09 PM

## 2017-01-06 ENCOUNTER — Inpatient Hospital Stay (HOSPITAL_COMMUNITY): Payer: Medicare Other

## 2017-01-06 ENCOUNTER — Inpatient Hospital Stay (HOSPITAL_COMMUNITY)
Admission: EM | Admit: 2017-01-06 | Discharge: 2017-01-22 | DRG: 377 | Disposition: A | Discharge status: Skilled Nursing Facility | Payer: Medicare Other | Attending: Internal Medicine | Admitting: Internal Medicine

## 2017-01-06 ENCOUNTER — Encounter (HOSPITAL_COMMUNITY): Payer: Self-pay

## 2017-01-06 DIAGNOSIS — D62 Acute posthemorrhagic anemia: Secondary | ICD-10-CM | POA: Diagnosis present

## 2017-01-06 DIAGNOSIS — I130 Hypertensive heart and chronic kidney disease with heart failure and stage 1 through stage 4 chronic kidney disease, or unspecified chronic kidney disease: Secondary | ICD-10-CM | POA: Diagnosis present

## 2017-01-06 DIAGNOSIS — J441 Chronic obstructive pulmonary disease with (acute) exacerbation: Secondary | ICD-10-CM | POA: Diagnosis present

## 2017-01-06 DIAGNOSIS — Z96652 Presence of left artificial knee joint: Secondary | ICD-10-CM | POA: Diagnosis present

## 2017-01-06 DIAGNOSIS — E46 Unspecified protein-calorie malnutrition: Secondary | ICD-10-CM | POA: Diagnosis present

## 2017-01-06 DIAGNOSIS — Z7901 Long term (current) use of anticoagulants: Secondary | ICD-10-CM

## 2017-01-06 DIAGNOSIS — K922 Gastrointestinal hemorrhage, unspecified: Secondary | ICD-10-CM | POA: Diagnosis present

## 2017-01-06 DIAGNOSIS — J9601 Acute respiratory failure with hypoxia: Secondary | ICD-10-CM | POA: Diagnosis not present

## 2017-01-06 DIAGNOSIS — K209 Esophagitis, unspecified without bleeding: Secondary | ICD-10-CM

## 2017-01-06 DIAGNOSIS — K219 Gastro-esophageal reflux disease without esophagitis: Secondary | ICD-10-CM | POA: Diagnosis present

## 2017-01-06 DIAGNOSIS — R195 Other fecal abnormalities: Secondary | ICD-10-CM | POA: Diagnosis not present

## 2017-01-06 DIAGNOSIS — T17990A Other foreign object in respiratory tract, part unspecified in causing asphyxiation, initial encounter: Secondary | ICD-10-CM | POA: Diagnosis present

## 2017-01-06 DIAGNOSIS — Z881 Allergy status to other antibiotic agents status: Secondary | ICD-10-CM | POA: Diagnosis not present

## 2017-01-06 DIAGNOSIS — K449 Diaphragmatic hernia without obstruction or gangrene: Secondary | ICD-10-CM | POA: Diagnosis present

## 2017-01-06 DIAGNOSIS — Z87891 Personal history of nicotine dependence: Secondary | ICD-10-CM | POA: Diagnosis not present

## 2017-01-06 DIAGNOSIS — R1313 Dysphagia, pharyngeal phase: Secondary | ICD-10-CM | POA: Diagnosis present

## 2017-01-06 DIAGNOSIS — Z7951 Long term (current) use of inhaled steroids: Secondary | ICD-10-CM | POA: Diagnosis not present

## 2017-01-06 DIAGNOSIS — I340 Nonrheumatic mitral (valve) insufficiency: Secondary | ICD-10-CM

## 2017-01-06 DIAGNOSIS — I4891 Unspecified atrial fibrillation: Secondary | ICD-10-CM | POA: Diagnosis not present

## 2017-01-06 DIAGNOSIS — Z66 Do not resuscitate: Secondary | ICD-10-CM | POA: Diagnosis present

## 2017-01-06 DIAGNOSIS — I5032 Chronic diastolic (congestive) heart failure: Secondary | ICD-10-CM | POA: Diagnosis present

## 2017-01-06 DIAGNOSIS — D6832 Hemorrhagic disorder due to extrinsic circulating anticoagulants: Secondary | ICD-10-CM | POA: Diagnosis present

## 2017-01-06 DIAGNOSIS — K31811 Angiodysplasia of stomach and duodenum with bleeding: Secondary | ICD-10-CM | POA: Diagnosis present

## 2017-01-06 DIAGNOSIS — N184 Chronic kidney disease, stage 4 (severe): Secondary | ICD-10-CM | POA: Diagnosis present

## 2017-01-06 DIAGNOSIS — N183 Chronic kidney disease, stage 3 unspecified: Secondary | ICD-10-CM | POA: Diagnosis present

## 2017-01-06 DIAGNOSIS — J9811 Atelectasis: Secondary | ICD-10-CM | POA: Diagnosis present

## 2017-01-06 DIAGNOSIS — I2699 Other pulmonary embolism without acute cor pulmonale: Secondary | ICD-10-CM | POA: Diagnosis present

## 2017-01-06 DIAGNOSIS — Z86711 Personal history of pulmonary embolism: Secondary | ICD-10-CM | POA: Diagnosis not present

## 2017-01-06 DIAGNOSIS — G4733 Obstructive sleep apnea (adult) (pediatric): Secondary | ICD-10-CM | POA: Diagnosis present

## 2017-01-06 DIAGNOSIS — J181 Lobar pneumonia, unspecified organism: Secondary | ICD-10-CM

## 2017-01-06 DIAGNOSIS — J942 Hemothorax: Secondary | ICD-10-CM | POA: Diagnosis present

## 2017-01-06 DIAGNOSIS — Z96659 Presence of unspecified artificial knee joint: Secondary | ICD-10-CM

## 2017-01-06 DIAGNOSIS — K7460 Unspecified cirrhosis of liver: Secondary | ICD-10-CM | POA: Diagnosis present

## 2017-01-06 DIAGNOSIS — Z6837 Body mass index (BMI) 37.0-37.9, adult: Secondary | ICD-10-CM

## 2017-01-06 DIAGNOSIS — M109 Gout, unspecified: Secondary | ICD-10-CM | POA: Diagnosis present

## 2017-01-06 DIAGNOSIS — I959 Hypotension, unspecified: Secondary | ICD-10-CM | POA: Diagnosis present

## 2017-01-06 DIAGNOSIS — F329 Major depressive disorder, single episode, unspecified: Secondary | ICD-10-CM | POA: Diagnosis present

## 2017-01-06 DIAGNOSIS — J9621 Acute and chronic respiratory failure with hypoxia: Secondary | ICD-10-CM | POA: Diagnosis present

## 2017-01-06 DIAGNOSIS — J690 Pneumonitis due to inhalation of food and vomit: Secondary | ICD-10-CM | POA: Diagnosis present

## 2017-01-06 DIAGNOSIS — Z885 Allergy status to narcotic agent status: Secondary | ICD-10-CM | POA: Diagnosis not present

## 2017-01-06 DIAGNOSIS — K2210 Ulcer of esophagus without bleeding: Secondary | ICD-10-CM | POA: Diagnosis present

## 2017-01-06 DIAGNOSIS — J189 Pneumonia, unspecified organism: Secondary | ICD-10-CM

## 2017-01-06 DIAGNOSIS — D500 Iron deficiency anemia secondary to blood loss (chronic): Secondary | ICD-10-CM | POA: Diagnosis present

## 2017-01-06 DIAGNOSIS — R791 Abnormal coagulation profile: Secondary | ICD-10-CM | POA: Diagnosis present

## 2017-01-06 DIAGNOSIS — I5033 Acute on chronic diastolic (congestive) heart failure: Secondary | ICD-10-CM | POA: Diagnosis present

## 2017-01-06 DIAGNOSIS — E440 Moderate protein-calorie malnutrition: Secondary | ICD-10-CM | POA: Diagnosis present

## 2017-01-06 DIAGNOSIS — J9600 Acute respiratory failure, unspecified whether with hypoxia or hypercapnia: Secondary | ICD-10-CM

## 2017-01-06 DIAGNOSIS — T45515A Adverse effect of anticoagulants, initial encounter: Secondary | ICD-10-CM | POA: Diagnosis present

## 2017-01-06 DIAGNOSIS — I10 Essential (primary) hypertension: Secondary | ICD-10-CM | POA: Diagnosis present

## 2017-01-06 DIAGNOSIS — Z79899 Other long term (current) drug therapy: Secondary | ICD-10-CM

## 2017-01-06 DIAGNOSIS — K5900 Constipation, unspecified: Secondary | ICD-10-CM | POA: Diagnosis present

## 2017-01-06 DIAGNOSIS — J449 Chronic obstructive pulmonary disease, unspecified: Secondary | ICD-10-CM

## 2017-01-06 DIAGNOSIS — R627 Adult failure to thrive: Secondary | ICD-10-CM | POA: Diagnosis present

## 2017-01-06 DIAGNOSIS — G4700 Insomnia, unspecified: Secondary | ICD-10-CM | POA: Diagnosis present

## 2017-01-06 DIAGNOSIS — S27309S Unspecified injury of lung, unspecified, sequela: Secondary | ICD-10-CM | POA: Diagnosis not present

## 2017-01-06 DIAGNOSIS — I2510 Atherosclerotic heart disease of native coronary artery without angina pectoris: Secondary | ICD-10-CM | POA: Diagnosis present

## 2017-01-06 DIAGNOSIS — K920 Hematemesis: Secondary | ICD-10-CM | POA: Diagnosis present

## 2017-01-06 DIAGNOSIS — K431 Incisional hernia with gangrene: Secondary | ICD-10-CM | POA: Diagnosis present

## 2017-01-06 HISTORY — DX: Chronic kidney disease, stage 3 (moderate): N18.3

## 2017-01-06 HISTORY — DX: Unspecified atrial fibrillation: I48.91

## 2017-01-06 LAB — COMPREHENSIVE METABOLIC PANEL
ALBUMIN: 2.1 g/dL — AB (ref 3.5–5.0)
ALT: 9 U/L — ABNORMAL LOW (ref 17–63)
ANION GAP: 12 (ref 5–15)
AST: 21 U/L (ref 15–41)
Alkaline Phosphatase: 61 U/L (ref 38–126)
BILIRUBIN TOTAL: 0.4 mg/dL (ref 0.3–1.2)
BUN: 45 mg/dL — ABNORMAL HIGH (ref 6–20)
CO2: 30 mmol/L (ref 22–32)
Calcium: 9 mg/dL (ref 8.9–10.3)
Chloride: 98 mmol/L — ABNORMAL LOW (ref 101–111)
Creatinine, Ser: 1.52 mg/dL — ABNORMAL HIGH (ref 0.61–1.24)
GFR calc non Af Amer: 42 mL/min — ABNORMAL LOW (ref >60)
GFR, EST AFRICAN AMERICAN: 48 mL/min — AB (ref >60)
GLUCOSE: 155 mg/dL — AB (ref 65–99)
POTASSIUM: 3.6 mmol/L (ref 3.5–5.1)
SODIUM: 140 mmol/L (ref 135–145)
TOTAL PROTEIN: 6.2 g/dL — AB (ref 6.5–8.1)

## 2017-01-06 LAB — CBC
HCT: 23.1 % — ABNORMAL LOW (ref 39.0–52.0)
HCT: 24.5 % — ABNORMAL LOW (ref 39.0–52.0)
HEMATOCRIT: 25.6 % — AB (ref 39.0–52.0)
HEMATOCRIT: 26.3 % — AB (ref 39.0–52.0)
HEMOGLOBIN: 7.6 g/dL — AB (ref 13.0–17.0)
HEMOGLOBIN: 6.9 g/dL — AB (ref 13.0–17.0)
HEMOGLOBIN: 8 g/dL — AB (ref 13.0–17.0)
Hemoglobin: 7.3 g/dL — ABNORMAL LOW (ref 13.0–17.0)
MCH: 22.6 pg — ABNORMAL LOW (ref 26.0–34.0)
MCH: 22.7 pg — ABNORMAL LOW (ref 26.0–34.0)
MCH: 23 pg — ABNORMAL LOW (ref 26.0–34.0)
MCH: 23.8 pg — ABNORMAL LOW (ref 26.0–34.0)
MCHC: 29.7 g/dL — AB (ref 30.0–36.0)
MCHC: 29.9 g/dL — AB (ref 30.0–36.0)
MCHC: 29.8 g/dL — ABNORMAL LOW (ref 30.0–36.0)
MCHC: 30.4 g/dL (ref 30.0–36.0)
MCV: 76 fL — ABNORMAL LOW (ref 78.0–100.0)
MCV: 76 fL — ABNORMAL LOW (ref 78.0–100.0)
MCV: 77.3 fL — AB (ref 78.0–100.0)
MCV: 78.3 fL (ref 78.0–100.0)
PLATELETS: 287 10*3/uL (ref 150–400)
PLATELETS: 257 10*3/uL (ref 150–400)
Platelets: 333 10*3/uL (ref 150–400)
Platelets: 249 10*3/uL (ref 150–400)
RBC: 3.37 MIL/uL — AB (ref 4.22–5.81)
RBC: 3.04 MIL/uL — AB (ref 4.22–5.81)
RBC: 3.17 MIL/uL — AB (ref 4.22–5.81)
RBC: 3.36 MIL/uL — AB (ref 4.22–5.81)
RDW: 16.9 % — ABNORMAL HIGH (ref 11.5–15.5)
RDW: 16.9 % — ABNORMAL HIGH (ref 11.5–15.5)
RDW: 16.9 % — ABNORMAL HIGH (ref 11.5–15.5)
RDW: 17.4 % — ABNORMAL HIGH (ref 11.5–15.5)
WBC: 8 10*3/uL (ref 4.0–10.5)
WBC: 9.7 10*3/uL (ref 4.0–10.5)
WBC: 7.9 10*3/uL (ref 4.0–10.5)
WBC: 7.5 10*3/uL (ref 4.0–10.5)

## 2017-01-06 LAB — ECHOCARDIOGRAM COMPLETE
HEIGHTINCHES: 72 in
WEIGHTICAEL: 4028.25 [oz_av]

## 2017-01-06 LAB — PROTIME-INR
INR: 5.57
INR: 1.66
PROTHROMBIN TIME: 52.2 s — AB (ref 11.4–15.2)
PROTHROMBIN TIME: 19.8 s — AB (ref 11.4–15.2)

## 2017-01-06 LAB — PREPARE RBC (CROSSMATCH)

## 2017-01-06 LAB — APTT: aPTT: 84 seconds — ABNORMAL HIGH (ref 24–36)

## 2017-01-06 LAB — MRSA PCR SCREENING: MRSA by PCR: NEGATIVE

## 2017-01-06 LAB — POC OCCULT BLOOD, ED: FECAL OCCULT BLD: POSITIVE — AB

## 2017-01-06 MED ORDER — SODIUM CHLORIDE 0.9% FLUSH
3.0000 mL | Freq: Two times a day (BID) | INTRAVENOUS | Status: DC
  Administered 2017-01-06 – 2017-01-22 (×27): 3 mL via INTRAVENOUS

## 2017-01-06 MED ORDER — SODIUM CHLORIDE 0.9 % IV SOLN
10.0000 mL/h | Freq: Once | INTRAVENOUS | Status: AC
  Administered 2017-01-06: 10 mL/h via INTRAVENOUS

## 2017-01-06 MED ORDER — ONDANSETRON HCL 4 MG PO TABS: 4.0000 mg | ORAL_TABLET | Freq: Four times a day (QID) | ORAL | Status: DC | PRN

## 2017-01-06 MED ORDER — SODIUM CHLORIDE 0.9 % IV SOLN: 1000.0000 mL | INTRAVENOUS | Status: DC

## 2017-01-06 MED ORDER — ONDANSETRON HCL 4 MG/2ML IJ SOLN
4.0000 mg | Freq: Once | INTRAMUSCULAR | Status: AC
  Administered 2017-01-06: 4 mg via INTRAVENOUS
  Filled 2017-01-06: qty 2

## 2017-01-06 MED ORDER — STERILE WATER FOR INJECTION IJ SOLN
INTRAMUSCULAR | Status: AC
  Administered 2017-01-06: 10 mL
  Filled 2017-01-06: qty 10

## 2017-01-06 MED ORDER — PANTOPRAZOLE SODIUM 40 MG IV SOLR
40.0000 mg | Freq: Two times a day (BID) | INTRAVENOUS | Status: DC
  Administered 2017-01-06 – 2017-01-10 (×10): 40 mg via INTRAVENOUS
  Filled 2017-01-06 (×11): qty 40

## 2017-01-06 MED ORDER — BUDESONIDE 0.5 MG/2ML IN SUSP
0.5000 mg | Freq: Two times a day (BID) | RESPIRATORY_TRACT | Status: DC
  Administered 2017-01-06 – 2017-01-22 (×33): 0.5 mg via RESPIRATORY_TRACT
  Filled 2017-01-06 (×33): qty 2

## 2017-01-06 MED ORDER — ARFORMOTEROL TARTRATE 15 MCG/2ML IN NEBU
15.0000 ug | INHALATION_SOLUTION | Freq: Two times a day (BID) | RESPIRATORY_TRACT | Status: DC
  Administered 2017-01-06 – 2017-01-21 (×32): 15 ug via RESPIRATORY_TRACT
  Filled 2017-01-06 (×33): qty 2

## 2017-01-06 MED ORDER — FOLIC ACID 1 MG PO TABS
1.0000 mg | ORAL_TABLET | Freq: Every day | ORAL | Status: DC
Start: 2017-01-06 — End: 2017-01-22
  Administered 2017-01-09 – 2017-01-22 (×14): 1 mg via ORAL
  Filled 2017-01-06 (×14): qty 1

## 2017-01-06 MED ORDER — SODIUM CHLORIDE 0.9 % IV BOLUS (SEPSIS)
1000.0000 mL | Freq: Once | INTRAVENOUS | Status: AC
  Administered 2017-01-06: 1000 mL via INTRAVENOUS

## 2017-01-06 MED ORDER — DEXTROSE 5 % IV SOLN
10.0000 mg | Freq: Once | INTRAVENOUS | Status: AC
  Administered 2017-01-06: 10 mg via INTRAVENOUS
  Filled 2017-01-06: qty 1

## 2017-01-06 MED ORDER — SODIUM CHLORIDE 0.9 % IV BOLUS (SEPSIS)
1000.0000 mL | Freq: Once | INTRAVENOUS | Status: AC
  Administered 2017-01-06: 2000 mL via INTRAVENOUS

## 2017-01-06 MED ORDER — SODIUM CHLORIDE 0.9 % IV SOLN
3.0000 g | Freq: Four times a day (QID) | INTRAVENOUS | Status: AC
  Administered 2017-01-06 – 2017-01-12 (×25): 3 g via INTRAVENOUS
  Filled 2017-01-06 (×28): qty 3

## 2017-01-06 MED ORDER — IPRATROPIUM BROMIDE 0.02 % IN SOLN
0.5000 mg | Freq: Four times a day (QID) | RESPIRATORY_TRACT | Status: DC
  Administered 2017-01-06 – 2017-01-09 (×12): 0.5 mg via RESPIRATORY_TRACT
  Filled 2017-01-06 (×13): qty 2.5

## 2017-01-06 MED ORDER — PANTOPRAZOLE SODIUM 40 MG IV SOLR
40.0000 mg | Freq: Once | INTRAVENOUS | Status: AC
Start: 2017-01-06 — End: 2017-01-06
  Administered 2017-01-06: 40 mg via INTRAVENOUS
  Filled 2017-01-06: qty 40

## 2017-01-06 MED ORDER — OXYCODONE HCL 5 MG PO TABS: 2.5000 mg | ORAL_TABLET | ORAL | Status: DC | PRN

## 2017-01-06 MED ORDER — ACETAMINOPHEN 650 MG RE SUPP: 650.0000 mg | Freq: Four times a day (QID) | RECTAL | Status: DC | PRN

## 2017-01-06 MED ORDER — SODIUM CHLORIDE 0.9 % IV SOLN
Freq: Once | INTRAVENOUS | Status: AC
  Administered 2017-01-06: 18:00:00 via INTRAVENOUS

## 2017-01-06 MED ORDER — ONDANSETRON HCL 4 MG/2ML IJ SOLN
4.0000 mg | Freq: Four times a day (QID) | INTRAMUSCULAR | Status: DC | PRN
  Administered 2017-01-07 – 2017-01-11 (×3): 4 mg via INTRAVENOUS
  Filled 2017-01-06 (×2): qty 2

## 2017-01-06 MED ORDER — ALBUTEROL SULFATE (2.5 MG/3ML) 0.083% IN NEBU: 2.5000 mg | INHALATION_SOLUTION | Freq: Four times a day (QID) | RESPIRATORY_TRACT | Status: DC | PRN

## 2017-01-06 MED ORDER — ACETAMINOPHEN 325 MG PO TABS
650.0000 mg | ORAL_TABLET | Freq: Four times a day (QID) | ORAL | Status: DC | PRN
  Administered 2017-01-14: 650 mg via ORAL
  Filled 2017-01-06: qty 2

## 2017-01-06 MED ORDER — SERTRALINE HCL 100 MG PO TABS
100.0000 mg | ORAL_TABLET | Freq: Every day | ORAL | Status: DC
  Administered 2017-01-06 – 2017-01-21 (×16): 100 mg via ORAL
  Filled 2017-01-06 (×16): qty 1

## 2017-01-06 MED ORDER — POLYETHYLENE GLYCOL 3350 17 G PO PACK
17.0000 g | PACK | Freq: Every day | ORAL | Status: DC | PRN
  Administered 2017-01-11 – 2017-01-12 (×2): 17 g via ORAL
  Filled 2017-01-06 (×2): qty 1

## 2017-01-06 MED ORDER — SODIUM CHLORIDE 0.9 % IV SOLN: Freq: Once | INTRAVENOUS | Status: DC

## 2017-01-06 NOTE — ED Provider Notes (Signed)
[facility] DEPT Provider Note   CSN: [id number] Arrival date & time: 01/06/17  0509    History   Chief Complaint Chief Complaint  Patient presents with  . Emesis    HPI Gary Boyer is a 81 y.o. male.  81 year old male with a history of COPD, pulmonary embolus (on chronic Coumadin), esophageal reflux, and skin cancer presents to the emergency department for vomiting. Patient began experiencing coffee-ground emesis at 3 AM. Emesis is intermittent. No medications taken PTA. He has been complaining of some abdominal discomfort, though denies this during my encounter. He is primarily fixated on some back pain which has been persistent over the past 5 weeks. The patient has seen his urologist for this and reports no improvement with recent antibiotics. The patient has not noted any changes to his stools. No recent fever. Abdominal surgical history significant for ventral hernia repair in 2016. The patient has never been followed by a gastroenterologist. The patient has never had a colonoscopy. He does report hx of a "cyst in my stomach" which was surgically removed many years ago.   The history is provided by the patient. No language interpreter was used.  Emesis      Past Medical History:  Diagnosis Date  . Arthritis    ALL OVER  . Asthma   . Bilateral leg edema    WEARS SUPPORT HOSE  . Cancer ([place])    SKIN CANCERS  . COPD (chronic obstructive pulmonary disease) ([place])    HX OF SMOKING FOR 60 YRS-PT SEES DR. Winona Legato IN [place]   . Dysrhythmia    PAST HX OF HEART ABLATION FOR IRREGULAR HEART RATE --SUCCESSFUL-NO FURTHER PROB WITH IRREGULAR HEART BEAT  . GERD (gastroesophageal reflux disease)   . H/O hiatal hernia   . History of kidney stones    SOME SMALL STONES AT PRESENT TIME- TAKES POTASSIUM CITRATE  . Pneumonia    IN THE PAST - BUT NOT IN THE LAST YR  . Pulmonary embolus ([place]) 2011 OR 2012   CHRONIC WARFARIN SINCE THE BLOOD CLOT  . Shortness of breath    WITH ANY  EXERTION-USES OXYGEN AS NEEDED  . Sleep apnea    USES BIPAP  SETTING 16 / 11 - PER PULMONOGIST'S OFFICE NOTE    Patient Active Problem List   Diagnosis Date Noted  . COPD (chronic obstructive pulmonary disease) ([place])   . Chronic anticoagulation 01/12/2015  . Shock circulatory ([place]) 01/12/2015  . Acute respiratory failure with hypoxia ([place]) 01/12/2015  . COPD exacerbation ([place]) 01/11/2015  . Acute and chronic respiratory failure with hypoxia ([place]) 01/11/2015  . Acute-on-chronic kidney injury ([place]) 01/11/2015  . Atrial fibrillation ([place]) 01/11/2015  . Pulmonary embolism ([place]) 01/11/2015  . Incisional hernia with gangrene and obstruction s/p exlap/Sb resection & repair 01/11/2015 01/11/2015  . Depression, neurotic 04/20/2013  . Long term current use of anticoagulant 03/17/2013  . Edema 12/20/2012  . Compulsive tobacco user syndrome 12/15/2012  . Essential hypertension, benign 12/08/2012  . Chronic airway obstruction, not elsewhere classified 12/08/2012  . Unspecified arthropathy, lower leg 12/08/2012  . Benign essential HTN 12/08/2012  . Expected blood loss anemia 11/09/2012  . Morbid obesity ([place]) 11/09/2012  . S/P left TKA 11/08/2012  . Acid reflux 10/26/2012  . Avitaminosis D 01/29/2011    Past Surgical History:  Procedure Laterality Date  . HEART ABLATION    . MASS REMOVED FROM STOMACH - BENIGN  ABOUT 2008 ?  Marland Kitchen MULTIPLE TOOTH EXTRACTIONS    . TONSILLECTOMY  T & A - AS A CHILD  . TOTAL KNEE ARTHROPLASTY Left 11/08/2012   Procedure: LEFT TOTAL KNEE ARTHROPLASTY;  Surgeon: Mauri Pole, MD;  Location: [facility];  Service: Orthopedics;  Laterality: Left;  Marland Kitchen VENTRAL HERNIA REPAIR N/A 01/11/2015   Procedure: STRANGULATED VENTRAL HERNIA REPAIR, SUBTOTAL OMENTECTOMY, SMALL BOWEL RESECTION X2, PLACEMENT OF NEGATIVE PRESSURE DRESSING;  Surgeon: Fanny Skates, MD;  Location: [facility];  Service: General;  Laterality: N/A;       Home Medications    Prior to Admission medications     Medication Sig Start Date End Date Taking? Authorizing Provider  albuterol (PROVENTIL) (2.5 MG/3ML) 0.083% nebulizer solution Take 2.5 mg by nebulization every 6 (six) hours as needed for wheezing.    [provider]  amLODipine (NORVASC) 10 MG tablet Take 10 mg by mouth every morning.    [provider]  arformoterol (BROVANA) 15 MCG/2ML NEBU Take 15 mcg by nebulization 2 (two) times daily.    [provider]  budesonide (PULMICORT) 0.5 MG/2ML nebulizer solution Take 0.5 mg by nebulization 2 (two) times daily.    [provider]  cholecalciferol (VITAMIN D) 1000 UNITS tablet Take 1,000 Units by mouth daily.    [provider]  enalapril (VASOTEC) 10 MG tablet Take 10 mg by mouth 2 (two) times daily.    [provider]  ferrous sulfate 325 (65 FE) MG tablet Take 1 tablet (325 mg total) by mouth 3 (three) times daily after meals. Patient not taking: Reported on 01/11/2015 11/09/12   Danae Orleans, PA-C  furosemide (LASIX) 40 MG tablet Take 40 mg by mouth daily.    [provider]  guaiFENesin (MUCINEX) 600 MG 12 hr tablet Take 1 tablet (600 mg total) by mouth 2 (two) times daily as needed for congestion. Patient not taking: Reported on 01/11/2015 11/11/12   Danae Orleans, PA-C  HYDROcodone-acetaminophen (NORCO/VICODIN) 5-325 MG per tablet Take 1 tablet by mouth every 6 (six) hours as needed for moderate pain. 01/17/15   Dhungel, Nishant, MD  ipratropium (ATROVENT) 0.02 % nebulizer solution Take 500 mcg by nebulization 4 (four) times daily.    [provider]  polyethylene glycol (MIRALAX / GLYCOLAX) packet Take 17 g by mouth daily. Patient taking differently: Take 17 g by mouth once.  11/11/12   Danae Orleans, PA-C  Potassium Citrate 15 MEQ (1620 MG) TBCR Take 1 tablet by mouth 2 (two) times daily.    [provider]  ranitidine (ZANTAC) 150 MG tablet Take 150 mg by mouth 2 (two) times daily.    [provider]   sertraline (ZOLOFT) 50 MG tablet Take 50 mg by mouth at bedtime.    [provider]  vitamin B-12 (CYANOCOBALAMIN) 1000 MCG tablet Take 1,000 mcg by mouth daily.    [provider]  warfarin (COUMADIN) 7.5 MG tablet Take 1 tablet (7.5 mg total) by mouth daily. Take 1 tablet daily after dinner. Please check INR daily until therapeutic and adjust dose 01/17/15   Dhungel, Flonnie Overman, MD    Family History No family history on file.  Social History Social History  Substance Use Topics  . Smoking status: Former Smoker    Packs/day: 2.00    Years: 60.00    Types: Cigarettes  . Smokeless tobacco: Never Used     Comment: QUIT SMOKING 2012  . Alcohol use No     Allergies   Ciprofloxacin; Morphine and related; and Zithromax [azithromycin]   Review of Systems Review of Systems  Gastrointestinal: Positive for vomiting.  Ten systems reviewed and are negative for acute change, except as noted in the HPI.    Physical Exam Updated Vital Signs BP (!) 80/48 Comment: NS fluid bolus-patient is awake and alert  Pulse 78 Comment: NS fluid bolus-patient is awake and alert  Temp 98.3 F (36.8 C) (Oral)   Resp 20 Comment: NS fluid bolus-patient is awake and alert  SpO2 91% Comment: NS fluid bolus-patient is awake and alert  Physical Exam  Constitutional: He is oriented to person, place, and time. He appears well-developed and well-nourished. No distress.  Good spirits, pale appearing; joking with staff.   HENT:  Head: Normocephalic and atraumatic.  Pale mm  Eyes: Conjunctivae and EOM are normal. No scleral icterus.  Neck: Normal range of motion.  Cardiovascular: Normal rate, regular rhythm and intact distal pulses.   Pulmonary/Chest: Effort normal. No respiratory distress. He has no wheezes.  93% on 2L via [place]  Abdominal:  No focal abdominal TTP. Abdomen soft, morbidly obese. No masses or peritoneal signs.  Musculoskeletal: Normal range of motion.  Neurological: He is alert  and oriented to person, place, and time. He exhibits normal muscle tone. Coordination normal.  Skin: Skin is warm and dry. No rash noted. He is not diaphoretic. No erythema. There is pallor.  Psychiatric: He has a normal mood and affect. His behavior is normal.  Nursing note and vitals reviewed.    ED Treatments / Results  Labs (all labs ordered are listed, but only abnormal results are displayed) Labs Reviewed  COMPREHENSIVE METABOLIC PANEL - Abnormal; Notable for the following:       Result Value   Chloride 98 (*)    Glucose, Bld 155 (*)    BUN 45 (*)    Creatinine, Ser 1.52 (*)    Total Protein 6.2 (*)    Albumin 2.1 (*)    ALT 9 (*)    GFR calc non Af Amer 42 (*)    GFR calc Af Amer 48 (*)    All other components within normal limits  CBC - Abnormal; Notable for the following:    RBC 3.37 (*)    Hemoglobin 7.6 (*)    HCT 25.6 (*)    MCV 76.0 (*)    MCH 22.6 (*)    MCHC 29.7 (*)    RDW 16.9 (*)    All other components within normal limits  PROTIME-INR - Abnormal; Notable for the following:    Prothrombin Time 52.2 (*)    INR 5.57 (*)    All other components within normal limits  APTT - Abnormal; Notable for the following:    aPTT 84 (*)    All other components within normal limits  POC OCCULT BLOOD, ED - Abnormal; Notable for the following:    Fecal Occult Bld POSITIVE (*)    All other components within normal limits  TYPE AND SCREEN  PREPARE RBC (CROSSMATCH)    EKG  EKG Interpretation None       Radiology No results found.  Procedures Procedures (including critical care time)  Medications Ordered in ED Medications  sodium chloride 0.9 % bolus 1,000 mL (1,000 mLs Intravenous New Bag/Given 01/06/17 0640)    Followed by  sodium chloride 0.9 % bolus 1,000 mL (0 mLs Intravenous Stopped 01/06/17 0639)    Followed by  0.9 %  sodium chloride infusion (not administered)  phytonadione (VITAMIN K) 10 mg in dextrose 5 % 50 mL IVPB (10 mg Intravenous New Bag/Given  01/06/17 6387)  0.9 %  sodium chloride infusion (not administered)  ondansetron (ZOFRAN) injection 4 mg (4 mg Intravenous Given 01/06/17 0627)  pantoprazole (PROTONIX) injection 40 mg (40 mg Intravenous Given 01/06/17 0629)  sterile water (preservative free) injection (10 mLs  Given 01/06/17 0629)    CRITICAL CARE Performed by: Antonietta Breach   Total critical care time: 35 minutes  Critical care time was exclusive of separately billable procedures and treating other patients.  Critical care was necessary to treat or prevent imminent or life-threatening deterioration.  Critical care was time spent personally by me on the following activities: development of treatment plan with patient and/or surrogate as well as nursing, discussions with consultants, evaluation of patient's response to treatment, examination of patient, obtaining history from patient or surrogate, ordering and performing treatments and interventions, ordering and review of laboratory studies, ordering and review of radiographic studies, pulse oximetry and re-evaluation of patient's condition.   Initial Impression / Assessment and Plan / ED Course  I have reviewed the triage vital signs and the nursing notes.  Pertinent labs & imaging results that were available during my care of the patient were reviewed by me and considered in my medical decision making (see chart for details).    26:32 AM 81 year old male presenting for coffee-ground emesis which began at 3 AM. He was noted to be hypotensive on arrival as well as mildly hypoxic. Hypoxia has been managed with oxygen via nasal cannula. Hypotension responding to IV fluids. No compensatory tachycardia noted and patient does not appear to be on beta blockers. He is noted to have an INR of 5.57 with anemia new from baseline. BUN is elevated. High suspicion for acute upper GI bleed. Vitamin K ordered for reversal of Coumadin which patient is on for management of PE. Also hx of atrial  fibrillation; patient is s/p ablation. PRBCs ordered for transfusion as well. Will consult with gastroenterology.  6:50 AM Case discussed with Dr. Collene Mares of gastroenterology. She will see the patient in consultation. Dr. Collene Mares unable to perform endoscopy until Coumadin is reversed. I verbalized to Dr. Collene Mares that vitamin K has been ordered. She recommends admission to [facility] hospitalist service. Consult placed for admission.  7:15 AM Case discussed with Dr. Posey Pronto of [facility]. He will see the patient in the ED. Plan for admission to [facility].   Vitals:   01/06/17 0623 01/06/17 0624 01/06/17 0645 01/06/17 0700  BP: (!) 80/48 (!) 80/48 (!) 96/46 (!) 102/43  Pulse: 66 78  75  Resp: 20 20 (!) 27 14  Temp:      TempSrc:      SpO2: 95% 91%  94%    Final Clinical Impressions(s) / ED Diagnoses   Final diagnoses:  UGIB (upper gastrointestinal bleed)    New Prescriptions New Prescriptions   No medications on file     Antonietta Breach, PA-C 01/06/17 0719    Molpus, Jenny Reichmann, MD 01/06/17 [phone]

## 2017-01-06 NOTE — ED Notes (Signed)
Patient is alert and oriented with family at bedside. Skin is warm and dry-pale with poor turgor. Dried coffee ground emesis on beard.

## 2017-01-06 NOTE — ED Notes (Signed)
Family at bedside. 

## 2017-01-06 NOTE — Consult Note (Addendum)
Cross cover [facility] Reason for Consult: CGE and anemia with hypotension. Referring Physician: THP  Gary Boyer is an 81 y.o. male.  HPI: [name] is a 81 year old white male with multiple medical problems listed below at about 3 AM yesterday he started having some coffee-ground emesis which prompted him to come to the emergency room. On evaluation in the ER he was found to have guaiac-positive stools and anemia with a hemoglobin of 7 g/dL he denied any melena or hematochezia. He has recently been treated for a UTI with doxycycline and prednisone for osteoarthritis. He claims he's had a" tumor removed from his stomach" about 5 years ago when he lived in [place]. Apparently he was told that if this tumor was not removed he would turn into cancer. He could not tell me the details of what this tumor was. He's never had a colonoscopy. He denies any abdominal pain nausea vomiting at the time of examination. He denies a previous history of ulcers jaundice or colitis. He has a history of reflux and muscle is known to have a hiatal hernia. He is on chronic anticoagulation for atrial fibrillation and has had pulmonary embolism in the past.  Past Medical History:  Diagnosis Date  . Arthritis    ALL OVER  . Asthma   . Bilateral leg edema    WEARS SUPPORT HOSE  . Cancer ([place])    SKIN CANCERS  . COPD (chronic obstructive pulmonary disease) ([place])    HX OF SMOKING FOR 60 YRS-PT SEES DR. Winona Legato IN [place]   . Dysrhythmia    PAST HX OF HEART ABLATION FOR IRREGULAR HEART RATE --SUCCESSFUL-NO FURTHER PROB WITH IRREGULAR HEART BEAT  . GERD (gastroesophageal reflux disease)   . H/O hiatal hernia   . History of kidney stones    SOME SMALL STONES AT PRESENT TIME- TAKES POTASSIUM CITRATE  . Pneumonia    IN THE PAST - BUT NOT IN THE LAST YR  . Pulmonary embolus ([place]) 2011 OR 2012   CHRONIC WARFARIN SINCE THE BLOOD CLOT  . Shortness of breath    WITH ANY EXERTION-USES OXYGEN AS  NEEDED  . Sleep apnea    USES BIPAP  SETTING 16 / 11 - PER PULMONOGIST'S OFFICE NOTE   Past Surgical History:  Procedure Laterality Date  . HEART ABLATION    . MASS REMOVED FROM STOMACH - BENIGN  ABOUT 2008 ?  Marland Kitchen MULTIPLE TOOTH EXTRACTIONS    . TONSILLECTOMY     T & A - AS A CHILD  . TOTAL KNEE ARTHROPLASTY Left 11/08/2012   Procedure: LEFT TOTAL KNEE ARTHROPLASTY;  Surgeon: Mauri Pole, MD;  [facility];  Service: Orthopedics;  Laterality: Left;  Marland Kitchen VENTRAL HERNIA REPAIR N/A 01/11/2015   Procedure: STRANGULATED VENTRAL HERNIA REPAIR, SUBTOTAL OMENTECTOMY, SMALL BOWEL RESECTION X2, PLACEMENT OF NEGATIVE PRESSURE DRESSING;  Surgeon: Fanny Skates, MD;  Location: [facility];  Service: General;  Laterality: N/A;   No family history on file.  Social History:  reports that he has quit smoking. His smoking use included Cigarettes. He has a 120.00 pack-year smoking history. He has never used smokeless tobacco. He reports that he does not drink alcohol or use drugs.  Allergies:  Allergies  Allergen Reactions  . Ciprofloxacin Diarrhea  . Morphine And Related Other (See Comments)    Reaction:  Hallucinations   . Zithromax [Azithromycin] Diarrhea   Medications: I have reviewed the patient's current medications.  Results for orders placed or performed during the  hospital encounter of 01/06/17 (from the past 48 hour(s))  Comprehensive metabolic panel     Status: Abnormal   Collection Time: 01/06/17  5:22 AM  Result Value Ref Range   Sodium 140 135 - 145 mmol/L   Potassium 3.6 3.5 - 5.1 mmol/L   Chloride 98 (L) 101 - 111 mmol/L   CO2 30 22 - 32 mmol/L   Glucose, Bld 155 (H) 65 - 99 mg/dL   BUN 45 (H) 6 - 20 mg/dL   Creatinine, Ser 1.52 (H) 0.61 - 1.24 mg/dL   Calcium 9.0 8.9 - 10.3 mg/dL   Total Protein 6.2 (L) 6.5 - 8.1 g/dL   Albumin 2.1 (L) 3.5 - 5.0 g/dL   AST 21 15 - 41 U/L   ALT 9 (L) 17 - 63 U/L   Alkaline Phosphatase 61 38 - 126 U/L   Total Bilirubin 0.4 0.3 - 1.2 mg/dL   GFR  calc non Af Amer 42 (L) >60 mL/min   GFR calc Af Amer 48 (L) >60 mL/min    Comment: (NOTE) The eGFR has been calculated using the CKD EPI equation. This calculation has not been validated in all clinical situations. eGFR's persistently <60 mL/min signify possible Chronic Kidney Disease.    Anion gap 12 5 - 15  CBC     Status: Abnormal   Collection Time: 01/06/17  5:22 AM  Result Value Ref Range   WBC 8.0 4.0 - 10.5 K/uL   RBC 3.37 (L) 4.22 - 5.81 MIL/uL   Hemoglobin 7.6 (L) 13.0 - 17.0 g/dL   HCT 25.6 (L) 39.0 - 52.0 %   MCV 76.0 (L) 78.0 - 100.0 fL   MCH 22.6 (L) 26.0 - 34.0 pg   MCHC 29.7 (L) 30.0 - 36.0 g/dL   RDW 16.9 (H) 11.5 - 15.5 %   Platelets 333 150 - 400 K/uL  Type and screen [place]     Status: None (Preliminary result)   Collection Time: 01/06/17  5:22 AM  Result Value Ref Range   ABO/RH(D) O NEG    Antibody Screen NEG    Sample Expiration 01/09/2017    Unit Number [id number]    Blood Component Type RED CELLS,LR    Unit division 00    Status of Unit ISSUED    Transfusion Status OK TO TRANSFUSE    Crossmatch Result Compatible    Unit Number [id number]    Blood Component Type RED CELLS,LR    Unit division 00    Status of Unit ALLOCATED    Transfusion Status OK TO TRANSFUSE    Crossmatch Result Compatible   Protime-INR     Status: Abnormal   Collection Time: 01/06/17  5:24 AM  Result Value Ref Range   Prothrombin Time 52.2 (H) 11.4 - 15.2 seconds   INR 5.57 (HH)     Comment: CRITICAL RESULT CALLED TO, READ BACK BY AND VERIFIED WITH: ADKINS, L. RN _0  ON 7.4.18 BY NMCCOY   POC occult blood, ED     Status: Abnormal   Collection Time: 01/06/17  6:07 AM  Result Value Ref Range   Fecal Occult Bld POSITIVE (A) NEGATIVE  APTT     Status: Abnormal   Collection Time: 01/06/17  6:28 AM  Result Value Ref Range   aPTT 84 (H) 24 - 36 seconds    Comment:        IF BASELINE aPTT IS ELEVATED, SUGGEST PATIENT RISK ASSESSMENT BE USED TO  DETERMINE APPROPRIATE ANTICOAGULANT THERAPY.  Prepare RBC     Status: None   Collection Time: 01/06/17  7:00 AM  Result Value Ref Range   Order Confirmation ORDER PROCESSED BY BLOOD BANK   CBC     Status: Abnormal   Collection Time: 01/06/17  8:04 AM  Result Value Ref Range   WBC 9.7 4.0 - 10.5 K/uL   RBC 3.04 (L) 4.22 - 5.81 MIL/uL   Hemoglobin 6.9 (LL) 13.0 - 17.0 g/dL    Comment: REPEATED TO VERIFY CRITICAL RESULT CALLED TO, READ BACK BY AND VERIFIED WITH: KESSLER,R. RN _0  ON 7.4.18 BY NMCCOY    HCT 23.1 (L) 39.0 - 52.0 %   MCV 76.0 (L) 78.0 - 100.0 fL   MCH 22.7 (L) 26.0 - 34.0 pg   MCHC 29.9 (L) 30.0 - 36.0 g/dL   RDW 16.9 (H) 11.5 - 15.5 %   Platelets 287 150 - 400 K/uL   Dg Abd Acute W/chest  Result Date: 01/06/2017 CLINICAL DATA:  Patient with back pain. Coffee-ground emesis. Shortness of breath. EXAM: DG ABDOMEN ACUTE W/ 1V CHEST COMPARISON:  Chest radiograph 08/21/2015. FINDINGS: Patient is mildly rotated to the right. Stable enlarged cardiac and mediastinal contours. Hiatal hernia. There is a large area of consolidation throughout the right mid and lower lung. Left lung is clear. Possible small right pleural effusion. No pneumothorax. Gas is demonstrated within nondilated loops of large and small bowel in a nonobstructed pattern. No evidence for free intraperitoneal air. Marked degenerative changes of the lumbar spine. IMPRESSION: Nonobstructed bowel gas pattern. Patchy consolidation within the right mid and lower lung which may represent combination of atelectasis and scarring within the right lung base. Superimposed infectious process not excluded. Recommend further evaluation with chest CT to exclude underlying pulmonary mass. Electronically Signed   By: Lovey Newcomer M.D.   On: 01/06/2017 09:09   Review of Systems  Constitutional: Positive for malaise/fatigue. Negative for chills, diaphoresis, fever and weight loss.  HENT: Negative.   Eyes: Negative.   Respiratory:  Negative.   Cardiovascular: Negative.   Gastrointestinal: Positive for blood in stool, heartburn, nausea and vomiting. Negative for abdominal pain, constipation and diarrhea.  Musculoskeletal: Positive for back pain and joint pain.  Skin: Negative.   Neurological: Positive for weakness.  Endo/Heme/Allergies: Negative.   Psychiatric/Behavioral: Positive for depression. Negative for hallucinations, substance abuse and suicidal ideas. The patient is nervous/anxious.    Blood pressure (!) 86/44, pulse 67, temperature 98.5 F (36.9 C), temperature source Oral, resp. rate (!) 24, height 6' (1.829 m), weight 114.2 kg (251 lb 12.3 oz), SpO2 95 %. Physical Exam  Constitutional: He is oriented to person, place, and time. He appears well-developed and well-nourished.  HENT:  Head: Normocephalic and atraumatic.  Eyes: EOM are normal.  Neck: Normal range of motion. Neck supple.  Respiratory: Breath sounds normal.  GI: Soft. Bowel sounds are normal. He exhibits no mass. There is no tenderness. There is no rebound and no guarding.  Neurological: He is alert and oriented to person, place, and time.  Skin: Skin is warm and dry.  Psychiatric: He has a normal mood and affect. His behavior is normal. Judgment and thought content normal.   Assessment/Plan: 1) Anemia with coffee ground emesis and guaiac positive stools-INR of 5.57: will plan an EGD tomorrow if his INR has improved-patient is receiving Vitamin K. .  2) COPD/Chronic respiratory failure with hypoxia.  3) Atrial fibrillation/History of PE-On chronic anticoagulantion.  4) ?Aspiration pneumonia/?right lung mass-On Unasyn. CT chest pending. 5)  Cirrhosis on chest CT [will need workup] with a large hiatal hernia.  Jorje Vanatta 01/06/2017, 9:53 AM

## 2017-01-06 NOTE — ED Notes (Signed)
IST OF 2 UNITS OF PRBC INFUSING. PT HAS NO REACTION WITH FIRST UNIT. WIFE SIGNED CONSENT. ADMISSION MD PATEL VISITED WITH FAMILY AND PT BEFORE TRANSFER

## 2017-01-06 NOTE — ED Notes (Signed)
ADMITTING Provider at bedside. 

## 2017-01-06 NOTE — ED Notes (Signed)
Patient transported to X-ray. RN WITH PT

## 2017-01-06 NOTE — ED Triage Notes (Addendum)
Patient arrives by [facility] with complaints of vomiting brownish emesis which started at 0300. Patient resides at home with his wife. Patient also complaining of abdominal discomfort.

## 2017-01-06 NOTE — Progress Notes (Signed)
Pharmacy Antibiotic Note  Gary Boyer is a 81 y.o. male admitted on 01/06/2017 with aspiration pneumonia.  Pharmacy has been consulted for ampicillin/sulbactam dosing. CXR reveals consolidation in RML and RLL, infection not ruled out.   Plan:  Ampicillin/sulbactam 3gm IV q6h   Without fever and leukocytosis, evaluate need for antibiotics clinically  Do not anticipate need for renal adjust, pharmacy will follow at distance  Height: 6' (182.9 cm) Weight: 251 lb 12.3 oz (114.2 kg) IBW/kg (Calculated) : 77.6  Temp (24hrs), Avg:98.4 F (36.9 C), Min:98.3 F (36.8 C), Max:98.5 F (36.9 C)   Recent Labs Lab 01/06/17 0522 01/06/17 0804  WBC 8.0 9.7  CREATININE 1.52*  --     Estimated Creatinine Clearance: 51.4 mL/min (A) (by C-G formula based on SCr of 1.52 mg/dL (H)).    Allergies  Allergen Reactions  . Ciprofloxacin Diarrhea  . Morphine And Related Other (See Comments)    Reaction:  Hallucinations   . Zithromax [Azithromycin] Diarrhea    Antimicrobials this admission: 7/4 amp/sulb >>  Dose adjustments this admission:  Microbiology results: 7/4 MRSA PCR:   Thank you for allowing pharmacy to be a part of this patient's care.  Doreene Eland, PharmD, BCPS.   Pager: [phone] 01/06/2017 10:06 AM

## 2017-01-06 NOTE — ED Notes (Signed)
ADMISSION KIM RN PRESENT BEFORE TRANSFER

## 2017-01-06 NOTE — Progress Notes (Signed)
  Echocardiogram 2D Echocardiogram has been performed.  Gary Boyer [name] 01/06/2017, 11:49 AM

## 2017-01-06 NOTE — ED Notes (Signed)
Bed: RESA Expected date:  Expected time:  Means of arrival:  Comments: EMS 81 yo from home/vomiting brownish-abd [place] SBP 90-no IV

## 2017-01-06 NOTE — H&P (Signed)
[facility] History and Physical   Patient: Gary Boyer KYH:[id number]   PCP: [name], MD DOB: 1935-10-16   DOA: 01/06/2017   DOS: 01/06/2017   DOS: the patient was seen and examined on 01/06/2017  Patient coming from: The patient is coming from home.  Chief Complaint: Coffee-ground vomiting  HPI: Gary Boyer is a 81 y.o. male with Past medical history of A. fib, PE, chronic warfarin use, chronic CHF, chronic respiratory failure with hypoxia, COPD. Patient presents with complaints of coffee-ground emesis started overnight. Currently denies any nausea or abdominal pain. He mentions his breathing is also at his baseline. No cough currently but per family the patient has chronic cough no blood in the sputum. No black color bowel movement or active bleeding in the bowels. No diarrhea no constipation. Recently treated with doxycycline as well as prednisone for UTI and osteoarthritis respectively. Has poor appetite and an unintentional weight loss. Family is unable to quantify the degree of weight loss. No fever no chills no burning urination. No blood in the urine.  ED Course: Patient was found with hemoglobin of 7, hypotensive, FOBT positive. GI was consulted. Patient was referred for admission  At his baseline ambulates with support And is independent for most of his ADL; manages his medication on his own.  Review of Systems: as mentioned in the history of present illness.  All other systems reviewed and are negative.  Past Medical History:  Diagnosis Date  . Arthritis    ALL OVER  . Asthma   . Bilateral leg edema    WEARS SUPPORT HOSE  . Cancer ([place])    SKIN CANCERS  . COPD (chronic obstructive pulmonary disease) ([place])    HX OF SMOKING FOR 60 YRS-PT SEES DR. Winona Legato IN [place]   . Dysrhythmia    PAST HX OF HEART ABLATION FOR IRREGULAR HEART RATE --SUCCESSFUL-NO FURTHER PROB WITH IRREGULAR HEART BEAT  . GERD (gastroesophageal reflux disease)   . H/O hiatal hernia     . History of kidney stones    SOME SMALL STONES AT PRESENT TIME- TAKES POTASSIUM CITRATE  . Pneumonia    IN THE PAST - BUT NOT IN THE LAST YR  . Pulmonary embolus ([place]) 2011 OR 2012   CHRONIC WARFARIN SINCE THE BLOOD CLOT  . Shortness of breath    WITH ANY EXERTION-USES OXYGEN AS NEEDED  . Sleep apnea    USES BIPAP  SETTING 16 / 11 - PER PULMONOGIST'S OFFICE NOTE   Past Surgical History:  Procedure Laterality Date  . HEART ABLATION    . MASS REMOVED FROM STOMACH - BENIGN  ABOUT 2008 ?  Marland Kitchen MULTIPLE TOOTH EXTRACTIONS    . TONSILLECTOMY     T & A - AS A CHILD  . TOTAL KNEE ARTHROPLASTY Left 11/08/2012   Procedure: LEFT TOTAL KNEE ARTHROPLASTY;  Surgeon: Mauri Pole, MD;  Location: [facility];  Service: Orthopedics;  Laterality: Left;  Marland Kitchen VENTRAL HERNIA REPAIR N/A 01/11/2015   Procedure: STRANGULATED VENTRAL HERNIA REPAIR, SUBTOTAL OMENTECTOMY, SMALL BOWEL RESECTION X2, PLACEMENT OF NEGATIVE PRESSURE DRESSING;  Surgeon: Fanny Skates, MD;  Location: [facility];  Service: General;  Laterality: N/A;   Social History:  reports that he has quit smoking. His smoking use included Cigarettes. He has a 120.00 pack-year smoking history. He has never used smokeless tobacco. He reports that he does not drink alcohol or use drugs.  Allergies  Allergen Reactions  . Ciprofloxacin Diarrhea  . Morphine And Related Other (See Comments)  Reaction:  Hallucinations   . Zithromax [Azithromycin] Diarrhea     Family History  Problem Relation Age of Onset  . Breast cancer Mother   . Heart disease Father     Prior to Admission medications   Medication Sig Start Date End Date Taking? Authorizing Provider  albuterol (PROVENTIL) (2.5 MG/3ML) 0.083% nebulizer solution Take 2.5 mg by nebulization every 6 (six) hours as needed for wheezing or shortness of breath.    Yes [provider]  amLODipine (NORVASC) 10 MG tablet Take 10 mg by mouth daily.    Yes [provider]  arformoterol (BROVANA) 15  MCG/2ML NEBU Take 15 mcg by nebulization 2 (two) times daily.   Yes [provider]  budesonide (PULMICORT) 0.5 MG/2ML nebulizer solution Take 0.5 mg by nebulization 2 (two) times daily.   Yes [provider]  cholecalciferol (VITAMIN D) 1000 UNITS tablet Take 1,000 Units by mouth daily.   Yes [provider]  enalapril (VASOTEC) 10 MG tablet Take 10 mg by mouth 2 (two) times daily.   Yes [provider]  furosemide (LASIX) 40 MG tablet Take 40 mg by mouth daily.   Yes [provider]  ipratropium (ATROVENT) 0.02 % nebulizer solution Take 0.5 mg by nebulization 4 (four) times daily.    Yes [provider]  oxyCODONE (OXY IR/ROXICODONE) 5 MG immediate release tablet Take 2.5-5 mg by mouth every 4 (four) hours as needed for severe pain.   Yes [provider]  ranitidine (ZANTAC) 150 MG tablet Take 150 mg by mouth 2 (two) times daily.   Yes [provider]  sertraline (ZOLOFT) 50 MG tablet Take 100 mg by mouth at bedtime.    Yes [provider]  vitamin B-12 (CYANOCOBALAMIN) 1000 MCG tablet Take 1,000 mcg by mouth daily.   Yes [provider]  warfarin (COUMADIN) 1 MG tablet Take 0.5 mg by mouth at bedtime. Pt takes with a 4mg  tablet.   Yes [provider]  warfarin (COUMADIN) 4 MG tablet Take 4 mg by mouth at bedtime. Pt takes with one-half of a 1mg  tablet.   Yes [provider]    Physical Exam: Vitals:   01/06/17 0830 01/06/17 0857 01/06/17 0913 01/06/17 0943  BP: (!) 92/59 (!) 90/52 97/63 (!) 86/44  Pulse: 66 75 75 67  Resp: 19 (!) 21 (!) 21 (!) 24  Temp: 98.3 F (36.8 C) 98.3 F (36.8 C)  98.5 F (36.9 C)  TempSrc: Oral Oral  Oral  SpO2: 93% 93% 93% 95%  Weight: 123.4 kg (272 lb)   114.2 kg (251 lb 12.3 oz)  Height: 5\' 6"  (1.676 m)   6' (1.829 m)    General: Alert, Awake and Oriented to Time, Place and Person. Appear in moderate distress, affect appropriate Eyes: PERRL,  Conjunctiva normal ENT: Oral Mucosa clear moist. Neck: difficult to assess JVD, no Abnormal Mass Or lumps Cardiovascular: S1 and S2 Present, no Murmur, Peripheral Pulses Present Respiratory: normal respiratory effort, Bilateral Air entry equal and Decreased, no use of accessory muscle, basal Crackles, no wheezes Abdomen: Bowel Sound present, Soft and no tenderness, no hernia Skin: no redness, no Rash, no induration Extremities: trace Pedal edema, no calf tenderness Neurologic: Grossly no focal neuro deficit. Bilaterally Equal motor strength  Labs on Admission:  CBC:  Recent Labs Lab 01/06/17 0522 01/06/17 0804  WBC 8.0 9.7  HGB 7.6* 6.9*  HCT 25.6* 23.1*  MCV 76.0* 76.0*  PLT 333 378   Basic Metabolic  Panel:  Recent Labs Lab 01/06/17 0522  NA 140  K 3.6  CL 98*  CO2 30  GLUCOSE 155*  BUN 45*  CREATININE 1.52*  CALCIUM 9.0   GFR: Estimated Creatinine Clearance: 51.4 mL/min (A) (by C-G formula based on SCr of 1.52 mg/dL (H)). Liver Function Tests:  Recent Labs Lab 01/06/17 0522  AST 21  ALT 9*  ALKPHOS 61  BILITOT 0.4  PROT 6.2*  ALBUMIN 2.1*   No results for input(s): LIPASE, AMYLASE in the last 168 hours. No results for input(s): AMMONIA in the last 168 hours. Coagulation Profile:  Recent Labs Lab 01/06/17 0524  INR 5.57*   Cardiac Enzymes: No results for input(s): CKTOTAL, CKMB, CKMBINDEX, TROPONINI in the last 168 hours. BNP (last 3 results) No results for input(s): PROBNP in the last 8760 hours. HbA1C: No results for input(s): HGBA1C in the last 72 hours. CBG: No results for input(s): GLUCAP in the last 168 hours. Lipid Profile: No results for input(s): CHOL, HDL, LDLCALC, TRIG, CHOLHDL, LDLDIRECT in the last 72 hours. Thyroid Function Tests: No results for input(s): TSH, T4TOTAL, FREET4, T3FREE, THYROIDAB in the last 72 hours. Anemia Panel: No results for input(s): VITAMINB12, FOLATE, FERRITIN, TIBC, IRON, RETICCTPCT in the last 72  hours. Urine analysis:    Component Value Date/Time   COLORURINE AMBER (A) 01/11/2015 0659   APPEARANCEUR CLOUDY (A) 01/11/2015 0659   LABSPEC 1.023 01/11/2015 0659   PHURINE 5.0 01/11/2015 0659   GLUCOSEU NEGATIVE 01/11/2015 0659   HGBUR MODERATE (A) 01/11/2015 0659   BILIRUBINUR SMALL (A) 01/11/2015 0659   KETONESUR NEGATIVE 01/11/2015 0659   PROTEINUR 30 (A) 01/11/2015 0659   UROBILINOGEN 0.2 01/11/2015 0659   NITRITE NEGATIVE 01/11/2015 0659   LEUKOCYTESUR MODERATE (A) 01/11/2015 0659    Radiological Exams on Admission: Dg Abd Acute W/chest  Result Date: 01/06/2017 CLINICAL DATA:  Patient with back pain. Coffee-ground emesis. Shortness of breath. EXAM: DG ABDOMEN ACUTE W/ 1V CHEST COMPARISON:  Chest radiograph 08/21/2015. FINDINGS: Patient is mildly rotated to the right. Stable enlarged cardiac and mediastinal contours. Hiatal hernia. There is a large area of consolidation throughout the right mid and lower lung. Left lung is clear. Possible small right pleural effusion. No pneumothorax. Gas is demonstrated within nondilated loops of large and small bowel in a nonobstructed pattern. No evidence for free intraperitoneal air. Marked degenerative changes of the lumbar spine. IMPRESSION: Nonobstructed bowel gas pattern. Patchy consolidation within the right mid and lower lung which may represent combination of atelectasis and scarring within the right lung base. Superimposed infectious process not excluded. Recommend further evaluation with chest CT to exclude underlying pulmonary mass. Electronically Signed   By: Lovey Newcomer M.D.   On: 01/06/2017 09:09   Assessment/Plan 1. Acute upper GI bleed  baseline hemoglobin in the range of 9, presented with 7.6, dropped to 6.9. No active bleeding in the ER. X-ray chest abdomen negative for any acute intra-abdominal pathology or free air. FOBT positive. Started on IV Protonix every 12 hours. Will get 2 units PRBC. 2 units FFP for INR reversal. Also  got 10 mg IV vitamin K. Monitor CBC every 6 hours, INR every 8 hours. GI consult. We will follow recommendation. Patient currently hypotensive likely from hemorrhage, will monitor her response to transfusion and assess whether patient needs IV fluid or no,  currently holding. Monitoring step down unit. This could be gastritis that may have occurred from doxycycline as well as prednisone and presenting with upper GI bleed with super  therapeutic INR. Coumadin on hold.  2.history of A. Fib, CHA2DS2-VASc score 4 History of PE Chronic anticoagulation with warfarin. Chronic CHF on Lasix, suspecting diastolic. Supratherapeutic INR. Essential hypertension  Currently in A. fib, rate controlled Check echocardiogram, hold Coumadin, reverse supratherapeutic INR with IV vitamin K as well as 2 FFP. Monitor INR. Patient on 40 of Lasix at home, as well as Vasotec 10 mg twice a day and amlodipine 10 mg daily. Currently on hold due to hypotension.  4. aspiration pneumonia. Suspected right lung mass. Patient has some basal crackles. Chest x-ray shows evidence of large area of consolidation in the right mid and lower lung. Most likely patient may have aspirated while vomiting. We will treat with IV Unasyn at present. There is a suspicion that this could be right lung mass, CT chest has been recommended, will get once stable.  5.history of COPD. Chronic respiratory failure. OSA. Continue C Pap every night. Continue oxygen as needed. Continue inhalers as home dose. No evidence of active exacerbation.  6.unintentional weight loss. Protein calorie malnutrition. Morbid obesity. Dietary consulted. Monitor at present.  7.chronic kidney disease stage III. Renal function stable mild elevation of BUN and response to GI bleed. Currently monitor. With hypertension patient is at risk for developing acute kidney injury on top of chronic kidney disease over next 24-48 hours. Strict in and out and daily  weight.  Nutrinothing by mouth except medications at present DVT Prophylaxis: mechanical compression device  Advance goals of care discussion:   full code, his wife will be his POA. He does not want to be on life support for long period   ConsultsGastroenterology, Dr. Collene Mares Family Communication: family was present at bedside, at the time of interview.  Opportunity was given to ask question and all questions were answered satisfactorily.  Disposition: Admitted as inpatient, step-down unit. Likely to be discharged SNF, in 3-4 days. Severity of Illness: The appropriate patient status for this patient is INPATIENT. Inpatient status is judged to be reasonable and necessary in order to provide the required intensity of service to ensure the patient's safety. The patient's presenting symptoms, physical exam findings, and initial radiographic and laboratory data in the context of their chronic comorbidities is felt to place them at high risk for further clinical deterioration. Furthermore, it is not anticipated that the patient will be medically stable for discharge from the hospital within 2 midnights of admission. The following factors support the patient status of inpatient.   " The patient's presenting symptoms include coffee-ground emesis. " The worrisome physical exam findings include hypotension, FOBT positive. " The initial radiographic and laboratory data are worrisome because of hemoglobin 6.9, INR 5.5. " The chronic co-morbidities include CHF, COPD, A. fib, OSA, CKD 3.   * I certify that at the point of admission it is my clinical judgment that the patient will require inpatient hospital care spanning beyond 2 midnights from the point of admission due to high intensity of service, high risk for further deterioration and high frequency of surveillance required.*    Author: Berle Mull, MD [facility] Pager: [phone] 01/06/2017  If 7PM-7AM, please contact  night-coverage [email or web address] Password [email or web address]

## 2017-01-07 DIAGNOSIS — Z7901 Long term (current) use of anticoagulants: Secondary | ICD-10-CM

## 2017-01-07 DIAGNOSIS — I5032 Chronic diastolic (congestive) heart failure: Secondary | ICD-10-CM

## 2017-01-07 DIAGNOSIS — D62 Acute posthemorrhagic anemia: Secondary | ICD-10-CM

## 2017-01-07 LAB — CBC
HEMATOCRIT: 25.8 % — AB (ref 39.0–52.0)
HEMATOCRIT: 26 % — AB (ref 39.0–52.0)
HEMOGLOBIN: 7.8 g/dL — AB (ref 13.0–17.0)
Hemoglobin: 7.7 g/dL — ABNORMAL LOW (ref 13.0–17.0)
MCH: 23.5 pg — ABNORMAL LOW (ref 26.0–34.0)
MCH: 23.8 pg — AB (ref 26.0–34.0)
MCHC: 29.8 g/dL — ABNORMAL LOW (ref 30.0–36.0)
MCHC: 30 g/dL (ref 30.0–36.0)
MCV: 78.9 fL (ref 78.0–100.0)
MCV: 79.3 fL (ref 78.0–100.0)
PLATELETS: 230 10*3/uL (ref 150–400)
Platelets: 234 10*3/uL (ref 150–400)
RBC: 3.27 MIL/uL — AB (ref 4.22–5.81)
RBC: 3.28 MIL/uL — AB (ref 4.22–5.81)
RDW: 17.3 % — ABNORMAL HIGH (ref 11.5–15.5)
RDW: 17.3 % — ABNORMAL HIGH (ref 11.5–15.5)
WBC: 7 10*3/uL (ref 4.0–10.5)
WBC: 7.8 10*3/uL (ref 4.0–10.5)

## 2017-01-07 LAB — PREPARE FRESH FROZEN PLASMA
UNIT DIVISION: 0
Unit division: 0

## 2017-01-07 LAB — PROTIME-INR
INR: 1.44
Prothrombin Time: 17.6 seconds — ABNORMAL HIGH (ref 11.4–15.2)

## 2017-01-07 LAB — COMPREHENSIVE METABOLIC PANEL
ALBUMIN: 2.2 g/dL — AB (ref 3.5–5.0)
ALK PHOS: 56 U/L (ref 38–126)
ALT: 8 U/L — AB (ref 17–63)
ANION GAP: 8 (ref 5–15)
AST: 15 U/L (ref 15–41)
BUN: 47 mg/dL — ABNORMAL HIGH (ref 6–20)
CALCIUM: 8.6 mg/dL — AB (ref 8.9–10.3)
CHLORIDE: 103 mmol/L (ref 101–111)
CO2: 31 mmol/L (ref 22–32)
Creatinine, Ser: 1.48 mg/dL — ABNORMAL HIGH (ref 0.61–1.24)
GFR calc Af Amer: 50 mL/min — ABNORMAL LOW (ref >60)
GFR calc non Af Amer: 43 mL/min — ABNORMAL LOW (ref >60)
GLUCOSE: 96 mg/dL (ref 65–99)
Potassium: 3.9 mmol/L (ref 3.5–5.1)
SODIUM: 142 mmol/L (ref 135–145)
Total Bilirubin: 0.9 mg/dL (ref 0.3–1.2)
Total Protein: 6.2 g/dL — ABNORMAL LOW (ref 6.5–8.1)

## 2017-01-07 LAB — BPAM FFP
BLOOD PRODUCT EXPIRATION DATE: 201807092359
Blood Product Expiration Date: 201807092359
ISSUE DATE / TIME: 201807041122
ISSUE DATE / TIME: 201807041027
UNIT TYPE AND RH: 5100
Unit Type and Rh: 9500

## 2017-01-07 LAB — MAGNESIUM: Magnesium: 2 mg/dL (ref 1.7–2.4)

## 2017-01-07 LAB — HEMOGLOBIN AND HEMATOCRIT, BLOOD
HEMATOCRIT: 30.5 % — AB (ref 39.0–52.0)
HEMOGLOBIN: 9.2 g/dL — AB (ref 13.0–17.0)

## 2017-01-07 LAB — PREPARE RBC (CROSSMATCH)

## 2017-01-07 MED ORDER — HYDROMORPHONE HCL 1 MG/ML IJ SOLN: 1.0000 mg | INTRAMUSCULAR | Status: DC | PRN

## 2017-01-07 MED ORDER — SODIUM CHLORIDE 0.9 % IV SOLN
Freq: Once | INTRAVENOUS | Status: AC
  Administered 2017-01-07: 10:00:00 via INTRAVENOUS

## 2017-01-07 MED ORDER — HYDROMORPHONE HCL-NACL 0.5-0.9 MG/ML-% IV SOSY
1.0000 mg | PREFILLED_SYRINGE | INTRAVENOUS | Status: DC | PRN
  Administered 2017-01-07 – 2017-01-10 (×3): 1 mg via INTRAVENOUS
  Filled 2017-01-07 (×3): qty 2

## 2017-01-07 NOTE — Progress Notes (Addendum)
Patient ID: Gary Boyer, male   DOB: Dec 29, 1935, 81 y.o.   MRN: [id number]  PROGRESS NOTE    Gary Boyer  QHU:[id number] DOB: Jun 07, 1936 DOA: 01/06/2017  PCP: [name], MD   Brief Narrative:  81 year old male with history of a fib, PE, on coumadin, chronic CHF, COPD. Pt presented to [facility] ED with coffee-ground emesis started the night prior to the admission. Pt was recently treated with doxycyline and prednisone for UTI.  On admission, hgb was 7, pt was hypotensive and FOBT was positive. GI consulted.  Assessment & Plan:   Principal Problem:   Acute upper GI bleed / Acute blood loss anemia - Baseline hgb 9 and hgb dropped to low 6.9 - No active bleed in ED - No acute intra-abd finding on abd x ray - FOBT positive - Started protonix 40 mg IV Q 12 hours - Given 2 U FFP to reverse INR - Given 10 mg IV vitamin K - Given 2 U PRBC transfusion - GI consulted   Active Problems:   Morbid obesity due to excess calories ([place]) - Counseled on nutrition    Atrial fibrillation ([place]) - CHADS vasc score 4 - Not on coumadin due to GI bleed    Chronic diastolic CHF - Preserved EF - Compensated     Pulmonary embolism ([facility])  - AC On hold due to risk of bleed    CKD stage 4 - Cr in 2016 as high as 2.9 - Cr on this admission within baseline range     COPD (chronic obstructive pulmonary disease) ([facility]) - Continue current BD's    Aspiration pneumonia of right lower lobe ([facility]) - Continue Unasyn - CXR showed large area of consolidation in the right mid and lower lung    Protein calorie malnutrition, moderate - Due to acute illness    DVT prophylaxis: SCD's Code Status: full code  Family Communication: no family at bedside Disposition Plan: remains in SDU and monitoring on acute gi bleed   Consultants:   GI, Dr. Owens Loffler   Procedures:   None  Antimicrobials:   Unasyn   Subjective: No overnight events.  Objective: Vitals:   01/07/17 0900 01/07/17 0952  01/07/17 1000 01/07/17 1025  BP: (!) 113/51 (!) 91/50 (!) 108/58 (!) 119/54  Pulse: (!) 35 73 78 61  Resp: (!) 25 (!) 22 (!) 24 (!) 22  Temp:  97.6 F (36.4 C)  (!) 97.3 F (36.3 C)  TempSrc:  Axillary  Axillary  SpO2: 90% 92% 95% 94%  Weight:      Height:        Intake/Output Summary (Last 24 hours) at 01/07/17 1152 Last data filed at 01/07/17 1010  Gross per 24 hour  Intake              978 ml  Output             1050 ml  Net              -72 ml   Filed Weights   01/06/17 0830 01/06/17 0943 01/07/17 0500  Weight: 123.4 kg (272 lb) 114.2 kg (251 lb 12.3 oz) 117.7 kg (259 lb 7.7 oz)    Examination:  General exam: Appears calm and comfortable  Respiratory system: Clear to auscultation. Respiratory effort normal. Cardiovascular system: S1 & S2 heard, Rate controlled  Gastrointestinal system: Abdomen is nondistended, soft and nontender. No organomegaly or masses felt. Normal bowel sounds heard. Central nervous system: Alert and oriented.  No focal neurological deficits. Extremities: Symmetric 5 x 5 power. Skin: No rashes, lesions or ulcers Psychiatry: Judgement and insight appear normal. Mood & affect appropriate.   Data Reviewed: I have personally reviewed following labs and imaging studies  CBC:  Recent Labs Lab 01/06/17 0804 01/06/17 1506 01/06/17 2217 01/07/17 0152 01/07/17 0758  WBC 9.7 7.9 7.5 7.0 7.8  HGB 6.9* 7.3* 8.0* 7.7* 7.8*  HCT 23.1* 24.5* 26.3* 25.8* 26.0*  MCV 76.0* 77.3* 78.3 78.9 79.3  PLT 287 257 249 230 161   Basic Metabolic Panel:  Recent Labs Lab 01/06/17 0522 01/07/17 0152  NA 140 142  K 3.6 3.9  CL 98* 103  CO2 30 31  GLUCOSE 155* 96  BUN 45* 47*  CREATININE 1.52* 1.48*  CALCIUM 9.0 8.6*  MG  --  2.0   GFR: Estimated Creatinine Clearance: 53.6 mL/min (A) (by C-G formula based on SCr of 1.48 mg/dL (H)). Liver Function Tests:  Recent Labs Lab 01/06/17 0522 01/07/17 0152  AST 21 15  ALT 9* 8*  ALKPHOS 61 56  BILITOT 0.4  0.9  PROT 6.2* 6.2*  ALBUMIN 2.1* 2.2*   No results for input(s): LIPASE, AMYLASE in the last 168 hours. No results for input(s): AMMONIA in the last 168 hours. Coagulation Profile:  Recent Labs Lab 01/06/17 0524 01/06/17 1506 01/07/17 0152  INR 5.57* 1.66 1.44   Cardiac Enzymes: No results for input(s): CKTOTAL, CKMB, CKMBINDEX, TROPONINI in the last 168 hours. BNP (last 3 results) No results for input(s): PROBNP in the last 8760 hours. HbA1C: No results for input(s): HGBA1C in the last 72 hours. CBG: No results for input(s): GLUCAP in the last 168 hours. Lipid Profile: No results for input(s): CHOL, HDL, LDLCALC, TRIG, CHOLHDL, LDLDIRECT in the last 72 hours. Thyroid Function Tests: No results for input(s): TSH, T4TOTAL, FREET4, T3FREE, THYROIDAB in the last 72 hours. Anemia Panel: No results for input(s): VITAMINB12, FOLATE, FERRITIN, TIBC, IRON, RETICCTPCT in the last 72 hours. Urine analysis:    Component Value Date/Time   COLORURINE AMBER (A) 01/11/2015 0659   APPEARANCEUR CLOUDY (A) 01/11/2015 0659   LABSPEC 1.023 01/11/2015 0659   PHURINE 5.0 01/11/2015 0659   GLUCOSEU NEGATIVE 01/11/2015 0659   HGBUR MODERATE (A) 01/11/2015 0659   BILIRUBINUR SMALL (A) 01/11/2015 0659   KETONESUR NEGATIVE 01/11/2015 0659   PROTEINUR 30 (A) 01/11/2015 0659   UROBILINOGEN 0.2 01/11/2015 0659   NITRITE NEGATIVE 01/11/2015 0659   LEUKOCYTESUR MODERATE (A) 01/11/2015 0659   Sepsis Labs: @LABRCNTIP (procalcitonin:4,lacticidven:4)   ) Recent Results (from the past 240 hour(s))  MRSA PCR Screening     Status: None   Collection Time: 01/06/17  9:27 AM  Result Value Ref Range Status   MRSA by PCR NEGATIVE NEGATIVE Final    Comment:        The GeneXpert MRSA Assay (FDA approved for NASAL specimens only), is one component of a comprehensive MRSA colonization surveillance program. It is not intended to diagnose MRSA infection nor to guide or monitor treatment for MRSA  infections.       Radiology Studies: Ct Chest Wo Contrast  Result Date: 01/06/2017 CLINICAL DATA:  81 y.o. male with Past medical history of A. fib, PE, chronic warfarin use, chronic CHF, chronic respiratory failure with hypoxia, COPD. Patient presents with complaints of coffee-ground emesis started overnight. Has poor appetite and an unintentional weight loss. EXAM: CT CHEST WITHOUT CONTRAST TECHNIQUE: Multidetector CT imaging of the chest was performed following the standard protocol without IV  contrast. COMPARISON:  CT of the abdomen and pelvis 01/09/2014 and CT of the chest 10/01/2010 FINDINGS: Cardiovascular: The heart is enlarged. There is extensive coronary artery calcification. No pericardial effusion. Significant atherosclerosis of the thoracic aorta. Aortic arch is atherosclerotic. Main pulmonary artery is prominent, main pulmonary artery is enlarged, measuring 4.1 cm. Mediastinum/Nodes: The visualized portion of the thyroid gland has a normal appearance. Large hiatal hernia. Lungs/Pleura: Right pleural effusion and right lower lobe consolidation. There is vascular crowding at both lung bases. There are pleural calcifications. Mild airspace filling is identified within the right upper lobe. Cavitary lesion identified in the left lung apex measuring 1.6 by 4.1 cm. This is smaller compared to 2012 There are paraseptal emphysematous changes in the lung apices bilaterally. Upper Abdomen: There is cirrhotic contour of the liver. Spleen appears enlarged but is only partially imaged. Indeterminate mass is identified in the midpole region of the left kidney, measuring 4.1 cm. There are intrarenal calcifications bilaterally. Adrenal glands are normal in appearance. Musculoskeletal: Degenerative changes are seen in the thoracic spine. IMPRESSION: 1. Cardiomegaly, coronary artery disease and thoracic atherosclerosis. 2. Enlarged pulmonary artery, consistent with pulmonary arterial hypertension. 3. Right  pleural effusion. Airspace filling opacities, consistent with infectious process in the right lower lobe and upper lobes. 4. Pleural calcifications, consistent with asbestos exposure. 5. Cavitary lesion in the left upper lobe. 6. Paraseptal emphysema. 7. Large hiatal hernia. 8. Cirrhosis of the liver. 9. Suspect splenomegaly and portal venous hypertension. 10. Left renal mass. Based on previous imaging, this likely represented a cyst but by today's imaging is indeterminate. Consider renal ultrasound for further characterization. 11. Bilateral intrarenal calculi. Aortic Atherosclerosis (ICD10-I70.0) and Emphysema (ICD10-J43.9). Electronically Signed   By: Nolon Nations M.D.   On: 01/06/2017 16:23   Dg Abd Acute W/chest  Result Date: 01/06/2017 CLINICAL DATA:  Patient with back pain. Coffee-ground emesis. Shortness of breath. EXAM: DG ABDOMEN ACUTE W/ 1V CHEST COMPARISON:  Chest radiograph 08/21/2015. FINDINGS: Patient is mildly rotated to the right. Stable enlarged cardiac and mediastinal contours. Hiatal hernia. There is a large area of consolidation throughout the right mid and lower lung. Left lung is clear. Possible small right pleural effusion. No pneumothorax. Gas is demonstrated within nondilated loops of large and small bowel in a nonobstructed pattern. No evidence for free intraperitoneal air. Marked degenerative changes of the lumbar spine. IMPRESSION: Nonobstructed bowel gas pattern. Patchy consolidation within the right mid and lower lung which may represent combination of atelectasis and scarring within the right lung base. Superimposed infectious process not excluded. Recommend further evaluation with chest CT to exclude underlying pulmonary mass. Electronically Signed   By: Lovey Newcomer M.D.   On: 01/06/2017 09:09        Scheduled Meds: . arformoterol  15 mcg Nebulization BID  . budesonide  0.5 mg Nebulization BID  . folic acid  1 mg Oral Daily  . ipratropium  0.5 mg Nebulization QID    . pantoprazole (PROTONIX) IV  40 mg Intravenous Q12H  . sertraline  100 mg Oral QHS  . sodium chloride flush  3 mL Intravenous Q12H   Continuous Infusions: . ampicillin-sulbactam (UNASYN) IV 3 g (01/07/17 0530)     LOS: 1 day    Time spent: 25 minutes  Greater than 50% of the time spent on counseling and coordinating the care.   Leisa Lenz, MD [facility] Pager [phone]  If 7PM-7AM, please contact night-coverage [email or web address] Password [email or web address] 01/07/2017, 11:52 AM

## 2017-01-07 NOTE — Progress Notes (Signed)
Progress Note   Subjective  Chief Complaint: Coffee -ground emesis, anemia  This morning the patient is found laying in bed with a high flow vent mask, O2 at 6L/min. He appears comfortable and is able to tell me that he has not had any further episodes of hematemesis since his one prior to admission. He has also not had a bowel movement since admission. Patient explains that he does feel somewhat better since he has been here. He is able to explain that he was told to be on O2 at home at 2L but rarely ever did that and when he measured his O2 level it was always 91-92.   Per nursing, patient was switched from nasal cannula yesterday afternoon to Vent mask and he seems to have a very rapid decline in O2 sat into the 80's without it. With mask he is around 91-92. He has had 3 u prbcs with little movement in hgb.  During time of interview I did witness this as I removed his mask while we were talking. Seemed to run anywhere from 84-87 without with some desats into low 80's.  With mask low 90's.    Objective   Vital signs in last 24 hours: Temp:  [97 F (36.1 C)-98.5 F (36.9 C)] 98.1 F (36.7 C) (07/05 0800) Pulse Rate:  [27-109] 77 (07/05 0817) Resp:  [16-30] 23 (07/05 0817) BP: (84-162)/(31-112) 96/42 (07/05 0817) SpO2:  [77 %-100 %] 90 % (07/05 0817) FiO2 (%):  [50 %-55 %] 55 % (07/05 0751) Weight:  [251 lb 12.3 oz (114.2 kg)-259 lb 7.7 oz (117.7 kg)] 259 lb 7.7 oz (117.7 kg) (07/05 0500) Last BM Date: 01/05/17 General:   Elderly Caucasian male in NAD Heart:  Regular rate and rhythm; no murmurs Lungs: Respirations even and unlabored, lungs CTA bilaterally, O2 via vent mask Abdomen:  Soft, nontender and nondistended. Normal bowel sounds. Extremities:  Without edema. Neurologic:  Alert and oriented,  grossly normal neurologically. Psych:  Cooperative. Normal mood and affect.  Intake/Output from previous day: 07/04 0701 - 07/05 0700 In: 3146.7 [I.V.:10; Blood:1686.7; IV  Piggyback:1450] Out: 700 [Urine:700]  Lab Results:  Recent Labs  01/06/17 2217 01/07/17 0152 01/07/17 0758  WBC 7.5 7.0 7.8  HGB 8.0* 7.7* 7.8*  HCT 26.3* 25.8* 26.0*  PLT 249 230 234   BMET  Recent Labs  01/06/17 0522 01/07/17 0152  NA 140 142  K 3.6 3.9  CL 98* 103  CO2 30 31  GLUCOSE 155* 96  BUN 45* 47*  CREATININE 1.52* 1.48*  CALCIUM 9.0 8.6*   LFT  Recent Labs  01/07/17 0152  PROT 6.2*  ALBUMIN 2.2*  AST 15  ALT 8*  ALKPHOS 56  BILITOT 0.9   PT/INR  Recent Labs  01/06/17 1506 01/07/17 0152  LABPROT 19.8* 17.6*  INR 1.66 1.44    Studies/Results: Ct Chest Wo Contrast  Result Date: 01/06/2017 CLINICAL DATA:  81 y.o. male with Past medical history of A. fib, PE, chronic warfarin use, chronic CHF, chronic respiratory failure with hypoxia, COPD. Patient presents with complaints of coffee-ground emesis started overnight. Has poor appetite and an unintentional weight loss. EXAM: CT CHEST WITHOUT CONTRAST TECHNIQUE: Multidetector CT imaging of the chest was performed following the standard protocol without IV contrast. COMPARISON:  CT of the abdomen and pelvis 01/09/2014 and CT of the chest 10/01/2010 FINDINGS: Cardiovascular: The heart is enlarged. There is extensive coronary artery calcification. No pericardial effusion. Significant atherosclerosis of the thoracic aorta. Aortic arch  is atherosclerotic. Main pulmonary artery is prominent, main pulmonary artery is enlarged, measuring 4.1 cm. Mediastinum/Nodes: The visualized portion of the thyroid gland has a normal appearance. Large hiatal hernia. Lungs/Pleura: Right pleural effusion and right lower lobe consolidation. There is vascular crowding at both lung bases. There are pleural calcifications. Mild airspace filling is identified within the right upper lobe. Cavitary lesion identified in the left lung apex measuring 1.6 by 4.1 cm. This is smaller compared to 2012 There are paraseptal emphysematous changes in  the lung apices bilaterally. Upper Abdomen: There is cirrhotic contour of the liver. Spleen appears enlarged but is only partially imaged. Indeterminate mass is identified in the midpole region of the left kidney, measuring 4.1 cm. There are intrarenal calcifications bilaterally. Adrenal glands are normal in appearance. Musculoskeletal: Degenerative changes are seen in the thoracic spine. IMPRESSION: 1. Cardiomegaly, coronary artery disease and thoracic atherosclerosis. 2. Enlarged pulmonary artery, consistent with pulmonary arterial hypertension. 3. Right pleural effusion. Airspace filling opacities, consistent with infectious process in the right lower lobe and upper lobes. 4. Pleural calcifications, consistent with asbestos exposure. 5. Cavitary lesion in the left upper lobe. 6. Paraseptal emphysema. 7. Large hiatal hernia. 8. Cirrhosis of the liver. 9. Suspect splenomegaly and portal venous hypertension. 10. Left renal mass. Based on previous imaging, this likely represented a cyst but by today's imaging is indeterminate. Consider renal ultrasound for further characterization. 11. Bilateral intrarenal calculi. Aortic Atherosclerosis (ICD10-I70.0) and Emphysema (ICD10-J43.9). Electronically Signed   By: Nolon Nations M.D.   On: 01/06/2017 16:23   Dg Abd Acute W/chest  Result Date: 01/06/2017 CLINICAL DATA:  Patient with back pain. Coffee-ground emesis. Shortness of breath. EXAM: DG ABDOMEN ACUTE W/ 1V CHEST COMPARISON:  Chest radiograph 08/21/2015. FINDINGS: Patient is mildly rotated to the right. Stable enlarged cardiac and mediastinal contours. Hiatal hernia. There is a large area of consolidation throughout the right mid and lower lung. Left lung is clear. Possible small right pleural effusion. No pneumothorax. Gas is demonstrated within nondilated loops of large and small bowel in a nonobstructed pattern. No evidence for free intraperitoneal air. Marked degenerative changes of the lumbar spine.  IMPRESSION: Nonobstructed bowel gas pattern. Patchy consolidation within the right mid and lower lung which may represent combination of atelectasis and scarring within the right lung base. Superimposed infectious process not excluded. Recommend further evaluation with chest CT to exclude underlying pulmonary mass. Electronically Signed   By: Lovey Newcomer M.D.   On: 01/06/2017 09:09    Assessment / Plan:   Assessment: 1. Reported Hematemesis: none since 01/05/17, no melena, no current signs of GI Bleed 2. Anemia: pt s/p 3 u prbcs, hgb continues to remain in low 7's 3. COPD/Chronic Respiratory Failure: O2 sats in 80's without O2 via vent mask- relation to pneumonia vs lung lesion vs anemia (or all) 4. Afib/ History of PE- on Coumadin (held as of 01/06/17)-INR 5.57 at admit, 1.44 this morning 5. ? Aspiration pneumonia and right lung mass?: CT confirming cavitary lesion 6. Cirrhosis: Seen at time of CT above; I believe this is new finding for the patient? Will need to be discussed later  Plan: 1. At this point in time with desaturation over the past 12 hours and no sign of bleeding since admit, would recommend holding off on EGD. We will attempt this tomorrow pending improvement in his pulmonary status. 2. Started regular diet today, NPO after midnight in hopes of procedure tomorrow 3. Did discuss above with patient. Explained that he is likely requiring  more O2 due to anemia and other conditions 4. Ordered 2 further units PRBCs today with hgb check after 5. Did discuss above with Dr. Ardis Hughs, he will see patient later today, please await any further recs from him  Thank you for your kind consultation, we will continue to follow along.   LOS: 1 day   Levin Erp  01/07/2017, 9:13 AM  Pager # [phone]   ________________________________________________________________________  Velora Heckler GI MD note:  I personally examined the patient, reviewed the data and agree with the assessment and  plan described above.  He has not had any recurrent vomiting and no BMs.  I don't think he's ready for EGD yet, will get another 2 units blood.  Hopefully requiring less supplemental oxygen by tomorrow.  Will allow him to eat for now.  Etiology of bleeding unclear but INR around 6 certainly contributed to his bleeding.  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  Perhaps that has recurred, perhaps PUD, AVM, severe esophagitis or gastritis. He was told 2 years ago that his liver looked cirrhotic but in the end care providers told him it was actually just a fatty liver.  Current imaging again suggests cirrhosis.  He has normal platelets, normal T bili, no ascites on exam so if he does have cirrhosis it seems well compensated.   Owens Loffler, MD [facility] Pager [phone]

## 2017-01-07 NOTE — Progress Notes (Signed)
CSW consulted to assist with d/c planning. Please consult PT when appropriate to assist with determining dc needs. CSW will assist if SNF is recommended.  Werner Lean LCSW [phone]

## 2017-01-08 ENCOUNTER — Encounter (HOSPITAL_COMMUNITY)
Admission: EM | Disposition: A | Discharge status: Home / Self Care | Payer: Self-pay | Source: Home / Self Care | Attending: Internal Medicine

## 2017-01-08 DIAGNOSIS — I4891 Unspecified atrial fibrillation: Secondary | ICD-10-CM

## 2017-01-08 DIAGNOSIS — N183 Chronic kidney disease, stage 3 (moderate): Secondary | ICD-10-CM

## 2017-01-08 LAB — TYPE AND SCREEN
ABO/RH(D): O NEG
Antibody Screen: NEGATIVE
UNIT DIVISION: 0
UNIT DIVISION: 0
UNIT DIVISION: 0
Unit division: 0

## 2017-01-08 LAB — BPAM RBC
BLOOD PRODUCT EXPIRATION DATE: 201808012359
Blood Product Expiration Date: 201807202359
Blood Product Expiration Date: 201807222359
Blood Product Expiration Date: 201808012359
ISSUE DATE / TIME: 201807041729
ISSUE DATE / TIME: 201807051300
ISSUE DATE / TIME: 201807040754
ISSUE DATE / TIME: 201807050957
UNIT TYPE AND RH: 9500
UNIT TYPE AND RH: 9500
Unit Type and Rh: 9500
Unit Type and Rh: 9500

## 2017-01-08 LAB — PROTIME-INR
INR: 1.34
PROTHROMBIN TIME: 16.6 s — AB (ref 11.4–15.2)

## 2017-01-08 SURGERY — CANCELLED PROCEDURE

## 2017-01-08 SURGICAL SUPPLY — 14 items

## 2017-01-08 NOTE — Progress Notes (Signed)
[facility] Progress Note    Since last GI note: Just changed from ventimask to [place] O2.  He is eager to proceed with care, EGD, and eat.  No overt GI bleeding since admit.  Objective: Vital signs in last 24 hours: Temp:  [97.3 F (36.3 C)-98.8 F (37.1 C)] 98.1 F (36.7 C) (07/06 0423) Pulse Rate:  [35-93] 70 (07/06 0600) Resp:  [19-28] 21 (07/06 0600) BP: (91-121)/(44-82) 109/71 (07/06 0600) SpO2:  [87 %-98 %] 92 % (07/06 0738) FiO2 (%):  [55 %] 55 % (07/06 0738) Weight:  [259 lb 14.8 oz (117.9 kg)] 259 lb 14.8 oz (117.9 kg) (07/06 0423) Last BM Date: 01/05/17 General: alert and oriented times 3 Heart: regular rate and rythm Abdomen: soft, non-tender, non-distended, normal bowel sounds   Lab Results:  Recent Labs  01/06/17 2217 01/07/17 0152 01/07/17 0758 01/07/17 1757  WBC 7.5 7.0 7.8  --   HGB 8.0* 7.7* 7.8* 9.2*  PLT 249 230 234  --   MCV 78.3 78.9 79.3  --     Recent Labs  01/06/17 0522 01/07/17 0152  NA 140 142  K 3.6 3.9  CL 98* 103  CO2 30 31  GLUCOSE 155* 96  BUN 45* 47*  CREATININE 1.52* 1.48*  CALCIUM 9.0 8.6*    Recent Labs  01/06/17 0522 01/07/17 0152  PROT 6.2* 6.2*  ALBUMIN 2.1* 2.2*  AST 21 15  ALT 9* 8*  ALKPHOS 61 56  BILITOT 0.4 0.9    Recent Labs  01/07/17 0152 01/08/17 0310  INR 1.44 1.34      Medications: Scheduled Meds: . arformoterol  15 mcg Nebulization BID  . budesonide  0.5 mg Nebulization BID  . folic acid  1 mg Oral Daily  . ipratropium  0.5 mg Nebulization QID  . pantoprazole (PROTONIX) IV  40 mg Intravenous Q12H  . sertraline  100 mg Oral QHS  . sodium chloride flush  3 mL Intravenous Q12H   Continuous Infusions: . ampicillin-sulbactam (UNASYN) IV Stopped (01/08/17 0812)   PRN Meds:.acetaminophen **OR** acetaminophen, albuterol, HYDROmorphone (DILAUDID) injection, [DISCONTINUED] ondansetron **OR** ondansetron (ZOFRAN) IV, polyethylene glycol    Assessment/Plan: 81 y.o. male upper  GI bleeding (CGE, heme + stools)  Has received 4 units PRBC and 2 units FFP with Hb rise from 6.9 to 9.2, and INR decline from 5.6 to 1.3.  Has chronic breathing trouble and is supposed to be on home oxygen. Since admission has required around 55%Fio2 (currently delivered by [place] with good oxygenation).  Planning on EGD later today as long as he continues to sat well.  For now, stay on PPI IV twice daily.   Milus Banister, MD  01/08/2017, 8:44 AM [facility] Pager [phone]

## 2017-01-08 NOTE — Progress Notes (Addendum)
Patient ID: Gary Boyer, male   DOB: 1936-04-08, 81 y.o.   MRN: [id number]  PROGRESS NOTE    Gary Boyer  JGG:[id number] DOB: Oct 05, 1935 DOA: 01/06/2017  PCP: [name], MD   Brief Narrative:  81 year old male with history of a fib, PE, on coumadin, chronic CHF, COPD. Pt presented to [facility] ED with coffee-ground emesis started the night prior to the admission. Pt was recently treated with doxycyline and prednisone for UTI.  On admission, hgb whes 7, pt was hypotensive and FOBT was positive. GI consulted.  Assessment & Plan:   Principal Problem:   Acute upper GI bleed / Acute blood loss anemia - Hgb as low as 6.9 - No acute intra-abdominal findings on x ray - FOBT positive on admission - Plan for EGD today - S/P 4 U PRBC and and 2 U FFP transfusion so far - Continue PPI IV - GI following  Active Problems:   Morbid obesity due to excess calories ([place]) - Counseled on nutrition     Atrial fibrillation ([place]) - CHADS vasc score 4 - Due to risk of GI bleed, AC on hold  - Given 10 mg IV vitamin K to reverse INR    Chronic diastolic CHF - Preserved EF - CHF compensated     Pulmonary embolism (HCC)  - Holding AC due to risk of bleed    CKD stage 4 - Cr in 2016 as high as 2.9 - Cr within baseline range     COPD (chronic obstructive pulmonary disease) (HCC) - Stable resp status     Aspiration pneumonia of right lower lobe (HCC) - CXR showed large area of consolidation in the right mid and lower lung - Continue Unasyn    Protein calorie malnutrition, moderate - In the context of acute illness - NPO for EGD today     Depression - Continue sertraline    DVT prophylaxis: SCD's due to risk of bleeding  Code Status: full code Family Communication: family not at the bedside this am Disposition Plan: plan for EGD today   Consultants:   GI, Dr. Owens Loffler   Procedures:   None  Antimicrobials:   Unasyn 01/06/2017 -->    Subjective: Feels  better.  Objective: Vitals:   01/08/17 0500 01/08/17 0600 01/08/17 0738 01/08/17 0900  BP: 106/66 109/71  (!) 103/52  Pulse: 74 70  75  Resp: (!) 22 (!) 21  (!) 25  Temp:      TempSrc:      SpO2: 96% 95% 92% 93%  Weight:      Height:        Intake/Output Summary (Last 24 hours) at 01/08/17 1020 Last data filed at 01/08/17 0900  Gross per 24 hour  Intake              930 ml  Output              800 ml  Net              130 ml   Filed Weights   01/06/17 0943 01/07/17 0500 01/08/17 0423  Weight: 114.2 kg (251 lb 12.3 oz) 117.7 kg (259 lb 7.7 oz) 117.9 kg (259 lb 14.8 oz)    Examination:  Physical Exam  Constitutional: Appears well-nourished. No distress.  CVS: Rate controlled, S1/S2 + Pulmonary: no wheezing, diminished breath sounds   Abdominal: Soft. BS +,  no distension, tenderness, rebound or guarding.  Musculoskeletal: Normal range of motion.  Lymphadenopathy: No  lymphadenopathy noted, cervical, inguinal. Neuro: Alert. Normal reflexes, muscle tone coordination. No cranial nerve deficit. Skin: Skin is warm and dry. Psychiatric: Normal mood and affect. Behavior, judgment, thought content normal.     Data Reviewed: I have personally reviewed following labs and imaging studies  CBC:  Recent Labs Lab 01/06/17 0804 01/06/17 1506 01/06/17 2217 01/07/17 0152 01/07/17 0758 01/07/17 1757  WBC 9.7 7.9 7.5 7.0 7.8  --   HGB 6.9* 7.3* 8.0* 7.7* 7.8* 9.2*  HCT 23.1* 24.5* 26.3* 25.8* 26.0* 30.5*  MCV 76.0* 77.3* 78.3 78.9 79.3  --   PLT 287 257 249 230 234  --    Basic Metabolic Panel:  Recent Labs Lab 01/06/17 0522 01/07/17 0152  NA 140 142  K 3.6 3.9  CL 98* 103  CO2 30 31  GLUCOSE 155* 96  BUN 45* 47*  CREATININE 1.52* 1.48*  CALCIUM 9.0 8.6*  MG  --  2.0   GFR: Estimated Creatinine Clearance: 53.6 mL/min (A) (by C-G formula based on SCr of 1.48 mg/dL (H)). Liver Function Tests:  Recent Labs Lab 01/06/17 0522 01/07/17 0152  AST 21 15  ALT 9*  8*  ALKPHOS 61 56  BILITOT 0.4 0.9  PROT 6.2* 6.2*  ALBUMIN 2.1* 2.2*   No results for input(s): LIPASE, AMYLASE in the last 168 hours. No results for input(s): AMMONIA in the last 168 hours. Coagulation Profile:  Recent Labs Lab 01/06/17 0524 01/06/17 1506 01/07/17 0152 01/08/17 0310  INR 5.57* 1.66 1.44 1.34   Cardiac Enzymes: No results for input(s): CKTOTAL, CKMB, CKMBINDEX, TROPONINI in the last 168 hours. BNP (last 3 results) No results for input(s): PROBNP in the last 8760 hours. HbA1C: No results for input(s): HGBA1C in the last 72 hours. CBG: No results for input(s): GLUCAP in the last 168 hours. Lipid Profile: No results for input(s): CHOL, HDL, LDLCALC, TRIG, CHOLHDL, LDLDIRECT in the last 72 hours. Thyroid Function Tests: No results for input(s): TSH, T4TOTAL, FREET4, T3FREE, THYROIDAB in the last 72 hours. Anemia Panel: No results for input(s): VITAMINB12, FOLATE, FERRITIN, TIBC, IRON, RETICCTPCT in the last 72 hours. Urine analysis:    Component Value Date/Time   COLORURINE AMBER (A) 01/11/2015 0659   APPEARANCEUR CLOUDY (A) 01/11/2015 0659   LABSPEC 1.023 01/11/2015 0659   PHURINE 5.0 01/11/2015 0659   GLUCOSEU NEGATIVE 01/11/2015 0659   HGBUR MODERATE (A) 01/11/2015 0659   BILIRUBINUR SMALL (A) 01/11/2015 0659   KETONESUR NEGATIVE 01/11/2015 0659   PROTEINUR 30 (A) 01/11/2015 0659   UROBILINOGEN 0.2 01/11/2015 0659   NITRITE NEGATIVE 01/11/2015 0659   LEUKOCYTESUR MODERATE (A) 01/11/2015 0659   Sepsis Labs: @LABRCNTIP (procalcitonin:4,lacticidven:4)    Recent Results (from the past 240 hour(s))  MRSA PCR Screening     Status: None   Collection Time: 01/06/17  9:27 AM  Result Value Ref Range Status   MRSA by PCR NEGATIVE NEGATIVE Final    Comment:        The GeneXpert MRSA Assay (FDA approved for NASAL specimens only), is one component of a comprehensive MRSA colonization surveillance program. It is not intended to diagnose MRSA infection  nor to guide or monitor treatment for MRSA infections.       Radiology Studies: Ct Chest Wo Contrast  Result Date: 01/06/2017 CLINICAL DATA:  81 y.o. male with Past medical history of A. fib, PE, chronic warfarin use, chronic CHF, chronic respiratory failure with hypoxia, COPD. Patient presents with complaints of coffee-ground emesis started overnight. Has poor appetite and an  unintentional weight loss. EXAM: CT CHEST WITHOUT CONTRAST TECHNIQUE: Multidetector CT imaging of the chest was performed following the standard protocol without IV contrast. COMPARISON:  CT of the abdomen and pelvis 01/09/2014 and CT of the chest 10/01/2010 FINDINGS: Cardiovascular: The heart is enlarged. There is extensive coronary artery calcification. No pericardial effusion. Significant atherosclerosis of the thoracic aorta. Aortic arch is atherosclerotic. Main pulmonary artery is prominent, main pulmonary artery is enlarged, measuring 4.1 cm. Mediastinum/Nodes: The visualized portion of the thyroid gland has a normal appearance. Large hiatal hernia. Lungs/Pleura: Right pleural effusion and right lower lobe consolidation. There is vascular crowding at both lung bases. There are pleural calcifications. Mild airspace filling is identified within the right upper lobe. Cavitary lesion identified in the left lung apex measuring 1.6 by 4.1 cm. This is smaller compared to 2012 There are paraseptal emphysematous changes in the lung apices bilaterally. Upper Abdomen: There is cirrhotic contour of the liver. Spleen appears enlarged but is only partially imaged. Indeterminate mass is identified in the midpole region of the left kidney, measuring 4.1 cm. There are intrarenal calcifications bilaterally. Adrenal glands are normal in appearance. Musculoskeletal: Degenerative changes are seen in the thoracic spine. IMPRESSION: 1. Cardiomegaly, coronary artery disease and thoracic atherosclerosis. 2. Enlarged pulmonary artery, consistent with  pulmonary arterial hypertension. 3. Right pleural effusion. Airspace filling opacities, consistent with infectious process in the right lower lobe and upper lobes. 4. Pleural calcifications, consistent with asbestos exposure. 5. Cavitary lesion in the left upper lobe. 6. Paraseptal emphysema. 7. Large hiatal hernia. 8. Cirrhosis of the liver. 9. Suspect splenomegaly and portal venous hypertension. 10. Left renal mass. Based on previous imaging, this likely represented a cyst but by today's imaging is indeterminate. Consider renal ultrasound for further characterization. 11. Bilateral intrarenal calculi. Aortic Atherosclerosis (ICD10-I70.0) and Emphysema (ICD10-J43.9). Electronically Signed   By: Nolon Nations M.D.   On: 01/06/2017 16:23   Dg Abd Acute W/chest  Result Date: 01/06/2017 CLINICAL DATA:  Patient with back pain. Coffee-ground emesis. Shortness of breath. EXAM: DG ABDOMEN ACUTE W/ 1V CHEST COMPARISON:  Chest radiograph 08/21/2015. FINDINGS: Patient is mildly rotated to the right. Stable enlarged cardiac and mediastinal contours. Hiatal hernia. There is a large area of consolidation throughout the right mid and lower lung. Left lung is clear. Possible small right pleural effusion. No pneumothorax. Gas is demonstrated within nondilated loops of large and small bowel in a nonobstructed pattern. No evidence for free intraperitoneal air. Marked degenerative changes of the lumbar spine. IMPRESSION: Nonobstructed bowel gas pattern. Patchy consolidation within the right mid and lower lung which may represent combination of atelectasis and scarring within the right lung base. Superimposed infectious process not excluded. Recommend further evaluation with chest CT to exclude underlying pulmonary mass. Electronically Signed   By: Lovey Newcomer M.D.   On: 01/06/2017 09:09        Scheduled Meds: . arformoterol  15 mcg Nebulization BID  . budesonide  0.5 mg Nebulization BID  . folic acid  1 mg Oral Daily    . ipratropium  0.5 mg Nebulization QID  . pantoprazole (PROTONIX) IV  40 mg Intravenous Q12H  . sertraline  100 mg Oral QHS  . sodium chloride flush  3 mL Intravenous Q12H   Continuous Infusions: . ampicillin-sulbactam (UNASYN) IV Stopped (01/08/17 0812)     LOS: 2 days    Time spent: 25 minutes  Greater than 50% of the time spent on counseling and coordinating the care.   Leisa Lenz, MD  [facility] Pager [phone]  If 7PM-7AM, please contact night-coverage [email or web address] Password [email or web address] 01/08/2017, 10:20 AM

## 2017-01-08 NOTE — Progress Notes (Signed)
EGD has been delayed for now. Patient continues to have trouble with his O2 saturation. He has had no further signs of GI bleeding at this point. I am resuming regular diet and have put in orders for patient to be NPO after midnight on Sunday with plans for re-attempt of EGD on Monday by Dr. Hilarie Fredrickson, if patient has improved from a pulmonary standpoint. We will not see the patient over the weekend, but please let us know if anything changes. Dr. Collene Mares is covering for our service.  Ellouise Newer, PA-C [facility]

## 2017-01-09 ENCOUNTER — Inpatient Hospital Stay (HOSPITAL_COMMUNITY): Payer: Medicare Other

## 2017-01-09 LAB — BASIC METABOLIC PANEL
ANION GAP: 9 (ref 5–15)
BUN: 33 mg/dL — ABNORMAL HIGH (ref 6–20)
CO2: 29 mmol/L (ref 22–32)
Calcium: 8.7 mg/dL — ABNORMAL LOW (ref 8.9–10.3)
Chloride: 104 mmol/L (ref 101–111)
Creatinine, Ser: 1.51 mg/dL — ABNORMAL HIGH (ref 0.61–1.24)
GFR, EST AFRICAN AMERICAN: 49 mL/min — AB (ref >60)
GFR, EST NON AFRICAN AMERICAN: 42 mL/min — AB (ref >60)
Glucose, Bld: 105 mg/dL — ABNORMAL HIGH (ref 65–99)
POTASSIUM: 4.3 mmol/L (ref 3.5–5.1)
SODIUM: 142 mmol/L (ref 135–145)

## 2017-01-09 LAB — CBC
HCT: 29.5 % — ABNORMAL LOW (ref 39.0–52.0)
Hemoglobin: 8.6 g/dL — ABNORMAL LOW (ref 13.0–17.0)
MCH: 24.2 pg — ABNORMAL LOW (ref 26.0–34.0)
MCHC: 29.2 g/dL — ABNORMAL LOW (ref 30.0–36.0)
MCV: 82.9 fL (ref 78.0–100.0)
PLATELETS: 208 10*3/uL (ref 150–400)
RBC: 3.56 MIL/uL — AB (ref 4.22–5.81)
RDW: 18.7 % — ABNORMAL HIGH (ref 11.5–15.5)
WBC: 7.8 10*3/uL (ref 4.0–10.5)

## 2017-01-09 LAB — PROTIME-INR
INR: 1.51
PROTHROMBIN TIME: 18.3 s — AB (ref 11.4–15.2)

## 2017-01-09 MED ORDER — METHYLPREDNISOLONE SODIUM SUCC 125 MG IJ SOLR
60.0000 mg | INTRAMUSCULAR | Status: DC
  Administered 2017-01-09 – 2017-01-10 (×2): 60 mg via INTRAVENOUS
  Filled 2017-01-09 (×2): qty 2

## 2017-01-09 MED ORDER — IPRATROPIUM-ALBUTEROL 0.5-2.5 (3) MG/3ML IN SOLN
3.0000 mL | RESPIRATORY_TRACT | Status: DC
  Administered 2017-01-09 – 2017-01-10 (×9): 3 mL via RESPIRATORY_TRACT
  Filled 2017-01-09 (×9): qty 3

## 2017-01-09 MED ORDER — IPRATROPIUM-ALBUTEROL 0.5-2.5 (3) MG/3ML IN SOLN: 3.0000 mL | RESPIRATORY_TRACT | Status: DC | PRN

## 2017-01-09 NOTE — Progress Notes (Addendum)
Patient ID: Gary Boyer, male   DOB: [birth year], 81 y.o.   MRN: [id number]  PROGRESS NOTE    Gary Boyer  NID:[id number] DOB: 12/09/1935 DOA: 01/06/2017  PCP: [name], MD   Brief Narrative:  81 year old male with history of a fib, PE, on coumadin, chronic CHF, COPD. Pt presented to [facility] ED with coffee-ground emesis started the night prior to the admission. Pt was recently treated with doxycyline and prednisone for UTI.  On admission, hgb was 7, pt was hypotensive and FOBT was positive. GI consulted. Patient has so far received 4 units of PRBC and 2 units of FFP transfusion. There is no overt GI bleed since admission. Patient has chronic breathing trouble and he supposed to be on home oxygen. He has required Ventimask and nasal cannula to keep oxygen saturation above 90%. Plan for EGD on Monday if respiratory status stable.   Assessment & Plan:   Principal Problem:   Acute upper GI bleed / Acute blood loss anemia - Hgb 6.9 on admission - FOBT positive on admission - No acute intra-abdominal findings and x-ray - S/P 4 U PRBC and 2 U FFP - Continue PPI therapy - GI following  - Possible EGD on Monday   Active Problems:   Morbid obesity due to excess calories ([place]) - Counseled on nutrition    Atrial fibrillation ([place]) - CHADS vasc score 4 - Given 10 mg IV vitamin K to reverse INR - Holding anticoagulation due to risk of bleeding    Chronic diastolic CHF - preserved ejection fraction on echo  - stable respiratory status    Pulmonary embolism (HCC)  - Holding anticoagulation due to risk of bleeding    CKD stage 4 - Cr in 2016 as high as 2.9 - Creatinine remains within baseline range    Acute COPD exacerbation (HCC) - Continue oxygen support via nasal cannula to keep oxygen saturation above 90% - Patient sounds little rhonchorous on physical exam, will start Solu-Medrol 60 mg IV every 24 hours - Added duoneb every 4 hours scheduled and every 4 hours as needed for  shortness of breath or wheezing - Repeat CXR this am - Continue Pulmicort and Brovana    Aspiration pneumonia of right lower lobe (HCC) - Repeat CXR this am - Continue Unasyn     Protein calorie malnutrition, moderate - In the context of acute illness - Diet as tolerated     Depression - Continue sertraline    DVT prophylaxis: SCD's Code Status: full code Family Communication: no family at the bedside this am Disposition Plan: EGD on Monday    Consultants:   GI, Dr. Owens Loffler   Procedures:   None  Antimicrobials:   Unasyn 01/06/2017 -->     Subjective: No overnight events.   Objective: Vitals:   01/09/17 0300 01/09/17 0400 01/09/17 0500 01/09/17 0748  BP: 107/73 106/72 (!) 104/58   Pulse: 64 88 78   Resp: (!) 22 (!) 21 (!) 23   Temp:  98.1 F (36.7 C)  98.7 F (37.1 C)  TempSrc:  Axillary  Oral  SpO2: 91% 90% (!) 89%   Weight:      Height:        Intake/Output Summary (Last 24 hours) at 01/09/17 0858 Last data filed at 01/09/17 0330  Gross per 24 hour  Intake             1040 ml  Output  975 ml  Net               65 ml   Filed Weights   01/07/17 0500 01/08/17 0423 01/09/17 0124  Weight: 117.7 kg (259 lb 7.7 oz) 117.9 kg (259 lb 14.8 oz) 118.8 kg (261 lb 14.5 oz)    Examination:  General exam: Appears calm and comfortable  Respiratory system: diminished with rhonchi but no wheezing  Cardiovascular system: S1 & S2 heard, Rate controlled  Gastrointestinal system: Abdomen is nondistended, soft and nontender.  Central nervous system: Alert and oriented. No focal neurological deficits. Extremities: Symmetric 5 x 5 power. Skin: No rashes, lesions or ulcers Psychiatry: Judgement and insight appear normal. Mood & affect appropriate.   Data Reviewed: I have personally reviewed following labs and imaging studies  CBC:  Recent Labs Lab 01/06/17 1506 01/06/17 2217 01/07/17 0152 01/07/17 0758 01/07/17 1757 01/09/17 0355   WBC 7.9 7.5 7.0 7.8  --  7.8  HGB 7.3* 8.0* 7.7* 7.8* 9.2* 8.6*  HCT 24.5* 26.3* 25.8* 26.0* 30.5* 29.5*  MCV 77.3* 78.3 78.9 79.3  --  82.9  PLT 257 249 230 234  --  891   Basic Metabolic Panel:  Recent Labs Lab 01/06/17 0522 01/07/17 0152 01/09/17 0355  NA 140 142 142  K 3.6 3.9 4.3  CL 98* 103 104  CO2 30 31 29   GLUCOSE 155* 96 105*  BUN 45* 47* 33*  CREATININE 1.52* 1.48* 1.51*  CALCIUM 9.0 8.6* 8.7*  MG  --  2.0  --    GFR: Estimated Creatinine Clearance: 52.8 mL/min (A) (by C-G formula based on SCr of 1.51 mg/dL (H)). Liver Function Tests:  Recent Labs Lab 01/06/17 0522 01/07/17 0152  AST 21 15  ALT 9* 8*  ALKPHOS 61 56  BILITOT 0.4 0.9  PROT 6.2* 6.2*  ALBUMIN 2.1* 2.2*   No results for input(s): LIPASE, AMYLASE in the last 168 hours. No results for input(s): AMMONIA in the last 168 hours. Coagulation Profile:  Recent Labs Lab 01/06/17 0524 01/06/17 1506 01/07/17 0152 01/08/17 0310 01/09/17 0355  INR 5.57* 1.66 1.44 1.34 1.51   Cardiac Enzymes: No results for input(s): CKTOTAL, CKMB, CKMBINDEX, TROPONINI in the last 168 hours. BNP (last 3 results) No results for input(s): PROBNP in the last 8760 hours. HbA1C: No results for input(s): HGBA1C in the last 72 hours. CBG: No results for input(s): GLUCAP in the last 168 hours. Lipid Profile: No results for input(s): CHOL, HDL, LDLCALC, TRIG, CHOLHDL, LDLDIRECT in the last 72 hours. Thyroid Function Tests: No results for input(s): TSH, T4TOTAL, FREET4, T3FREE, THYROIDAB in the last 72 hours. Anemia Panel: No results for input(s): VITAMINB12, FOLATE, FERRITIN, TIBC, IRON, RETICCTPCT in the last 72 hours. Urine analysis:  Sepsis Labs: @LABRCNTIP (procalcitonin:4,lacticidven:4)   Recent Results (from the past 240 hour(s))  MRSA PCR Screening     Status: None   Collection Time: 01/06/17  9:27 AM  Result Value Ref Range Status   MRSA by PCR NEGATIVE NEGATIVE Final      Radiology Studies: Ct  Chest Wo Contrast Result Date: 01/06/2017 1. Cardiomegaly, coronary artery disease and thoracic atherosclerosis. 2. Enlarged pulmonary artery, consistent with pulmonary arterial hypertension. 3. Right pleural effusion. Airspace filling opacities, consistent with infectious process in the right lower lobe and upper lobes. 4. Pleural calcifications, consistent with asbestos exposure. 5. Cavitary lesion in the left upper lobe. 6. Paraseptal emphysema. 7. Large hiatal hernia. 8. Cirrhosis of the liver. 9. Suspect splenomegaly and portal venous  hypertension. 10. Left renal mass. Based on previous imaging, this likely represented a cyst but by today's imaging is indeterminate. Consider renal ultrasound for further characterization. 11. Bilateral intrarenal calculi. Aortic Atherosclerosis (ICD10-I70.0) and Emphysema (ICD10-J43.9).   Dg Abd Acute W/chest Result Date: 01/06/2017 Nonobstructed bowel gas pattern. Patchy consolidation within the right mid and lower lung which may represent combination of atelectasis and scarring within the right lung base. Superimposed infectious process not excluded. Recommend further evaluation with chest CT to exclude underlying pulmonary mass.     Scheduled Meds: . arformoterol  15 mcg Nebulization BID  . budesonide  0.5 mg Nebulization BID  . folic acid  1 mg Oral Daily  . ipratropium  0.5 mg Nebulization QID  . pantoprazole (PROTONIX) IV  40 mg Intravenous Q12H  . sertraline  100 mg Oral QHS  . sodium chloride flush  3 mL Intravenous Q12H   Continuous Infusions: . ampicillin-sulbactam (UNASYN) IV 3 g (01/09/17 0845)     LOS: 3 days    Time spent: 25 minutes  Greater than 50% of the time spent on counseling and coordinating the care.   Leisa Lenz, MD [facility] Pager [phone]  If 7PM-7AM, please contact night-coverage [email or web address] Password [email or web address] 01/09/2017, 8:58 AM

## 2017-01-10 ENCOUNTER — Inpatient Hospital Stay (HOSPITAL_COMMUNITY): Payer: Medicare Other

## 2017-01-10 DIAGNOSIS — K922 Gastrointestinal hemorrhage, unspecified: Secondary | ICD-10-CM

## 2017-01-10 DIAGNOSIS — J9811 Atelectasis: Secondary | ICD-10-CM

## 2017-01-10 LAB — CBC
HEMATOCRIT: 30.6 % — AB (ref 39.0–52.0)
Hemoglobin: 8.8 g/dL — ABNORMAL LOW (ref 13.0–17.0)
MCH: 24 pg — AB (ref 26.0–34.0)
MCHC: 28.8 g/dL — AB (ref 30.0–36.0)
MCV: 83.4 fL (ref 78.0–100.0)
PLATELETS: 184 10*3/uL (ref 150–400)
RBC: 3.67 MIL/uL — ABNORMAL LOW (ref 4.22–5.81)
RDW: 19.1 % — AB (ref 11.5–15.5)
WBC: 8.1 10*3/uL (ref 4.0–10.5)

## 2017-01-10 LAB — BASIC METABOLIC PANEL
Anion gap: 4 — ABNORMAL LOW (ref 5–15)
BUN: 39 mg/dL — AB (ref 6–20)
CALCIUM: 8.8 mg/dL — AB (ref 8.9–10.3)
CO2: 34 mmol/L — AB (ref 22–32)
CREATININE: 1.65 mg/dL — AB (ref 0.61–1.24)
Chloride: 106 mmol/L (ref 101–111)
GFR calc Af Amer: 44 mL/min — ABNORMAL LOW (ref >60)
GFR, EST NON AFRICAN AMERICAN: 38 mL/min — AB (ref >60)
GLUCOSE: 123 mg/dL — AB (ref 65–99)
Potassium: 4.4 mmol/L (ref 3.5–5.1)
Sodium: 144 mmol/L (ref 135–145)

## 2017-01-10 LAB — PROTIME-INR
INR: 1.67
Prothrombin Time: 19.9 seconds — ABNORMAL HIGH (ref 11.4–15.2)

## 2017-01-10 MED ORDER — FENTANYL CITRATE (PF) 100 MCG/2ML IJ SOLN
INTRAMUSCULAR | Status: AC
  Filled 2017-01-10: qty 2

## 2017-01-10 MED ORDER — ACETYLCYSTEINE 10% NICU INHALATION SOLUTION
2.0000 mL | Freq: Two times a day (BID) | RESPIRATORY_TRACT | Status: DC
  Filled 2017-01-10: qty 2

## 2017-01-10 MED ORDER — MIDAZOLAM HCL 2 MG/2ML IJ SOLN: 2.0000 mg | Freq: Once | INTRAMUSCULAR | Status: DC

## 2017-01-10 MED ORDER — IPRATROPIUM-ALBUTEROL 0.5-2.5 (3) MG/3ML IN SOLN
3.0000 mL | Freq: Four times a day (QID) | RESPIRATORY_TRACT | Status: DC
  Administered 2017-01-11 – 2017-01-16 (×21): 3 mL via RESPIRATORY_TRACT
  Filled 2017-01-10 (×22): qty 3

## 2017-01-10 MED ORDER — FUROSEMIDE 10 MG/ML IJ SOLN
20.0000 mg | Freq: Once | INTRAMUSCULAR | Status: AC
  Administered 2017-01-10: 20 mg via INTRAVENOUS
  Filled 2017-01-10: qty 2

## 2017-01-10 MED ORDER — MIDAZOLAM HCL 2 MG/2ML IJ SOLN
INTRAMUSCULAR | Status: AC
  Administered 2017-01-10: 1 mg
  Filled 2017-01-10: qty 2

## 2017-01-10 MED ORDER — ACETYLCYSTEINE 20 % IN SOLN
2.0000 mL | Freq: Two times a day (BID) | RESPIRATORY_TRACT | Status: DC
  Administered 2017-01-10 – 2017-01-13 (×6): 2 mL via RESPIRATORY_TRACT
  Filled 2017-01-10 (×7): qty 4

## 2017-01-10 MED ORDER — FENTANYL CITRATE (PF) 100 MCG/2ML IJ SOLN: 100.0000 ug | Freq: Once | INTRAMUSCULAR | Status: DC

## 2017-01-10 MED ORDER — LIDOCAINE HCL 2 % IJ SOLN
INTRAMUSCULAR | Status: AC
  Administered 2017-01-10: 18:00:00
  Filled 2017-01-10: qty 20

## 2017-01-10 MED ORDER — LIDOCAINE HCL 2 % EX GEL
1.0000 "application " | Freq: Once | CUTANEOUS | Status: DC
  Filled 2017-01-10: qty 10

## 2017-01-10 NOTE — Consult Note (Signed)
PULMONARY / CRITICAL CARE MEDICINE   Name: Gary Boyer MRN: [id number] DOB: Nov 03, 1935    ADMISSION DATE:  01/06/2017 CONSULTATION DATE:  01/10/2017  REFERRING MD:  Dr. Rogers Blocker  CHIEF COMPLAINT:  Chest x-ray abnormality  HISTORY OF PRESENT ILLNESS:    This is a 81 year old male with history of A. fib, PE on Coumadin, chronic CHF presented to [facility] ER with coffee-ground emesis 4 days ago.  Patient was plan for EGD tomorrow morning. He had a chest x-ray done yesterday which showed right-sided opacification. This led to pulmonary consultation.  Patient is currently on 4 L nasal cannula saturating 95%. He denies any chest pain nausea vomiting cough cold abdominal pain and urinary or bowel complaints.  Repeat chest x-ray on today's x-ray showing improvement compared to yesterday  PAST MEDICAL HISTORY :  He  has a past medical history of Arthritis; Asthma; Bilateral leg edema; Cancer [facility]); COPD (chronic obstructive pulmonary disease) ([place]); Dysrhythmia; GERD (gastroesophageal reflux disease); H/O hiatal hernia; History of kidney stones; Pneumonia; Pulmonary embolus ([place]) (2011 OR 2012); Shortness of breath; and Sleep apnea.  PAST SURGICAL HISTORY: He  has a past surgical history that includes MASS REMOVED FROM STOMACH - BENIGN (ABOUT 2008 ?); HEART ABLATION; Multiple tooth extractions; Tonsillectomy; Total knee arthroplasty (Left, 11/08/2012); and Ventral hernia repair (N/A, 01/11/2015).  Allergies  Allergen Reactions  . Ciprofloxacin Diarrhea  . Morphine And Related Other (See Comments)    Reaction:  Hallucinations   . Zithromax [Azithromycin] Diarrhea    No current facility-administered medications on file prior to encounter.    Current Outpatient Prescriptions on File Prior to Encounter  Medication Sig  . albuterol (PROVENTIL) (2.5 MG/3ML) 0.083% nebulizer solution Take 2.5 mg by nebulization every 6 (six) hours as needed for wheezing or shortness of breath.   Marland Kitchen amLODipine  (NORVASC) 10 MG tablet Take 10 mg by mouth daily.   Marland Kitchen arformoterol (BROVANA) 15 MCG/2ML NEBU Take 15 mcg by nebulization 2 (two) times daily.  . budesonide (PULMICORT) 0.5 MG/2ML nebulizer solution Take 0.5 mg by nebulization 2 (two) times daily.  . cholecalciferol (VITAMIN D) 1000 UNITS tablet Take 1,000 Units by mouth daily.  . enalapril (VASOTEC) 10 MG tablet Take 10 mg by mouth 2 (two) times daily.  . furosemide (LASIX) 40 MG tablet Take 40 mg by mouth daily.  Marland Kitchen ipratropium (ATROVENT) 0.02 % nebulizer solution Take 0.5 mg by nebulization 4 (four) times daily.   . ranitidine (ZANTAC) 150 MG tablet Take 150 mg by mouth 2 (two) times daily.  . sertraline (ZOLOFT) 50 MG tablet Take 100 mg by mouth at bedtime.   . vitamin B-12 (CYANOCOBALAMIN) 1000 MCG tablet Take 1,000 mcg by mouth daily.    FAMILY HISTORY:  His indicated that the status of his mother is unknown. He indicated that the status of his father is unknown.    SOCIAL HISTORY: He  reports that he has quit smoking. His smoking use included Cigarettes. He has a 120.00 pack-year smoking history. He has never used smokeless tobacco. He reports that he does not drink alcohol or use drugs.  REVIEW OF SYSTEMS:   14 point review of systems done and is negative except as mentioned in history of present illness  VITAL SIGNS: BP 128/61   Pulse (!) 50   Temp 97.7 F (36.5 C) (Oral)   Resp (!) 27   Ht 6' (1.829 m)   Wt 118.5 kg (261 lb 3.9 oz)   SpO2 94%   BMI 35.43 kg/m  VENTILATOR SETTINGS: FiO2 (%):  [36 %-55 %] 36 %  INTAKE / OUTPUT: I/O last 3 completed shifts: In: 56 [P.O.:120; Other:120; IV Piggyback:400] Out: 570 [Urine:570]  PHYSICAL EXAMINATION: General:  Alert awake oriented 3 no apparent distress Neuro:  Nonfocal, follows commands HEENT:  Atraumatic normocephalic PERRLA EOMI Cardiovascular:  S1-S2 positive, Lungs:  Decreased breath sounds right base Abdomen:  Soft nontender nondistended bowel sounds  present Musculoskeletal:  No cyanosis clubbing edema Skin:  No rashes  LABS:  BMET  Recent Labs Lab 01/07/17 0152 01/09/17 0355 01/10/17 0348  NA 142 142 144  K 3.9 4.3 4.4  CL 103 104 106  CO2 31 29 34*  BUN 47* 33* 39*  CREATININE 1.48* 1.51* 1.65*  GLUCOSE 96 105* 123*    Electrolytes  Recent Labs Lab 01/07/17 0152 01/09/17 0355 01/10/17 0348  CALCIUM 8.6* 8.7* 8.8*  MG 2.0  --   --     CBC  Recent Labs Lab 01/07/17 0758 01/07/17 1757 01/09/17 0355 01/10/17 0348  WBC 7.8  --  7.8 8.1  HGB 7.8* 9.2* 8.6* 8.8*  HCT 26.0* 30.5* 29.5* 30.6*  PLT 234  --  208 184    Coag's  Recent Labs Lab 01/06/17 0628  01/08/17 0310 01/09/17 0355 01/10/17 0348  APTT 84*  --   --   --   --   INR  --   < > 1.34 1.51 1.67  < > = values in this interval not displayed.  Sepsis Markers No results for input(s): LATICACIDVEN, PROCALCITON, O2SATVEN in the last 168 hours.  ABG No results for input(s): PHART, PCO2ART, PO2ART in the last 168 hours.  Liver Enzymes  Recent Labs Lab 01/06/17 0522 01/07/17 0152  AST 21 15  ALT 9* 8*  ALKPHOS 61 56  BILITOT 0.4 0.9  ALBUMIN 2.1* 2.2*    Cardiac Enzymes No results for input(s): TROPONINI, PROBNP in the last 168 hours.  Glucose No results for input(s): GLUCAP in the last 168 hours.  Imaging No results found.   STUDIES:  Right sided hemithorax opacification 01/09/17 , improved on CXR today  CULTURES: MRSA PCR negative  ANTIBIOTICS: On unasyn for aspiration pneumonia starting 01/06/2017   ASSESSMENT / PLAN:  PULMONARY A: Atelectasis Right lung whiteout which is improved with chest physiotherapy and Mucomyst P:   Plan diagnostic bronchi since the right lower lobe is still atelectatic Discontinue steroids as they do not help with aspiration pneumonia Continue chest PT and mucomyst nebs  CARDIOVASCULAR A:  A. Fib Chronic diastolic CHF P:  CHADS Vasc score 4 diuresis Anticoagulation held due to  bleeding risk  RENAL A:   CKD4 P:   Cr within baseline range  GASTROINTESTINAL A:   Acute upper GI bleed P:   Planned for EGD tomorrow Transfuse as needed to keep hemoglobin more than 7 Continue PPI therapy   HEMATOLOGIC A:   Blood loss anemia P:  Transfuse prn. For EGD in am  INFECTIOUS A:   Aspiration pneumonia P:   Proceed with diagnostic bronch and send RLL washings for culture.  NEUROLOGIC A:   No acute issues   FAMILY  - Updates: no family at bedside.   [facility] [place] Pager: [phone]  01/10/2017, 5:15 PM

## 2017-01-10 NOTE — Procedures (Signed)
Bronchoscopy Procedure Note Gary Boyer [id number] 06/10/1936  Procedure: Bronchoscopy Indications: Diagnostic evaluation of the airways, Remove secretions and right lung atelectasis and white out  Procedure Details Consent: Risks of procedure as well as the alternatives and risks of each were explained to the (patient/caregiver).  Consent for procedure obtained. Time Out: Verified patient identification, verified procedure, site/side was marked, verified correct patient position, special equipment/implants available, medications/allergies/relevent history reviewed, required imaging and test results available.  Performed  In preparation for procedure, patient was given 100% FiO2, bronchoscope lubricated and lidocaine given via ETT (2 ml). Sedation: Benzodiazepines  Airway entered and the following bronchi were examined: RUL, RLL, LUL, LLL and Bronchi.   Procedures performed: Brushings performed Bronchoscope removed.    Evaluation Hemodynamic Status: BP stable throughout; O2 sats: stable throughout Patient's Current Condition: stable Specimens:  Sent - white sticky creamy mucoid Complications: No apparent complications Patient did tolerate procedure well.   Sharia Reeve 01/10/2017

## 2017-01-10 NOTE — Progress Notes (Addendum)
Patient ID: Gary Boyer, male   DOB: Jul 06, 1936, 81 y.o.   MRN: [id number]  PROGRESS NOTE    Locke Barrell  HWE:[id number] DOB: Mar 17, 1936 DOA: 01/06/2017  PCP: [name], MD   Brief Narrative:  81 year old male with history of a fib, PE, on coumadin, chronic CHF, COPD. Pt presented to [facility] ED with coffee-ground emesis started the night prior to the admission. Pt was recently treated with doxycyline and prednisone for UTI.  On admission, hgb was 7, pt was hypotensive and FOBT was positive. GI consulted. Patient has so far received 4 units of PRBC and 2 units of FFP transfusion. There is no overt GI bleed since admission. Patient has chronic breathing trouble and he supposed to be on home oxygen. He has required Ventimask and nasal cannula to keep oxygen saturation above 90%. Plan for EGD on Monday if respiratory status stable.   Assessment & Plan:   Principal Problem:   Acute upper GI bleed / Acute blood loss anemia - Hgb 6.9 on admission - FOBT positive on admission - No acute intra-abdominal findings and x-ray - S/P 4 U PRBC and 2 U FFP - Continue PPI therapy - EGD planned for Monday if resp status stable   Active Problems:   Acute respiratory failure with hypoxia / Acute COPD exacerbation  - CXR 7/7 showed complete opacification of the right hemithorax, has worsened since prior study - Appreciate pulmonary consult and recommendations - Added mucomyst and chest PT - Given 1 dose of IV lasix 20 mg  - Continue solumedrol and nebs as ordered     Morbid obesity due to excess calories ([place]) - Counseled on nutrition     Atrial fibrillation (HCC) - CHADS vasc score 4 - Given 10 mg IV vitamin K to reverse INR - AC on hold due to risk of bleeding     Chronic diastolic CHF - preserved ejection fraction on echo  - stable respiratory status    Pulmonary embolism (HCC)  - AC on hold due to risk of bleeding     CKD stage 4 - Cr in 2016 as high as 2.9 - Cr within baseline  range     Aspiration pneumonia of right lower lobe (HCC) - Continue Unasyn     Protein calorie malnutrition, moderate - In the context of chronic illness  - NPO after midnight for EGD in am    Depression - Continue sertraline   DVT prophylaxis: SCD's bilaterally  Code Status: full code Family Communication: family not at the bedside Disposition Plan: EGD on Monday   Consultants:   GI, Dr. Owens Loffler   Procedures:   None  Antimicrobials:   Unasyn 01/06/2017 -->     Subjective: No overnight events.  Objective: Vitals:   01/10/17 0834 01/10/17 1142 01/10/17 1222 01/10/17 1232  BP:   114/65   Pulse:   71   Resp:   19   Temp:  97.9 F (36.6 C)    TempSrc:  Oral    SpO2: 95%  90% 95%  Weight:      Height:        Intake/Output Summary (Last 24 hours) at 01/10/17 1501 Last data filed at 01/10/17 1421  Gross per 24 hour  Intake              520 ml  Output              320 ml  Net  200 ml   Filed Weights   01/08/17 0423 01/09/17 0124 01/10/17 0500  Weight: 117.9 kg (259 lb 14.8 oz) 118.8 kg (261 lb 14.5 oz) 118.5 kg (261 lb 3.9 oz)     Physical Exam  Constitutional: No distress   CVS: Rate controlled, S1/S2 + Pulmonary: coarse breath sounds, no wheezing  Abdominal: Soft. BS +,  no distension, tenderness, rebound or guarding.  Musculoskeletal: Normal range of motion. No edema and no tenderness.  Neuro: Alert. No focal deficits  Skin: Skin is warm and dry.  Psychiatric: Normal mood and affect.    Data Reviewed: I have personally reviewed following labs and imaging studies  CBC:  Recent Labs Lab 01/06/17 2217 01/07/17 0152 01/07/17 0758 01/07/17 1757 01/09/17 0355 01/10/17 0348  WBC 7.5 7.0 7.8  --  7.8 8.1  HGB 8.0* 7.7* 7.8* 9.2* 8.6* 8.8*  HCT 26.3* 25.8* 26.0* 30.5* 29.5* 30.6*  MCV 78.3 78.9 79.3  --  82.9 83.4  PLT 249 230 234  --  208 419   Basic Metabolic Panel:  Recent Labs Lab 01/06/17 0522  01/07/17 0152 01/09/17 0355 01/10/17 0348  NA 140 142 142 144  K 3.6 3.9 4.3 4.4  CL 98* 103 104 106  CO2 30 31 29  34*  GLUCOSE 155* 96 105* 123*  BUN 45* 47* 33* 39*  CREATININE 1.52* 1.48* 1.51* 1.65*  CALCIUM 9.0 8.6* 8.7* 8.8*  MG  --  2.0  --   --    GFR: Estimated Creatinine Clearance: 48.3 mL/min (A) (by C-G formula based on SCr of 1.65 mg/dL (H)). Liver Function Tests:  Recent Labs Lab 01/06/17 0522 01/07/17 0152  AST 21 15  ALT 9* 8*  ALKPHOS 61 56  BILITOT 0.4 0.9  PROT 6.2* 6.2*  ALBUMIN 2.1* 2.2*   No results for input(s): LIPASE, AMYLASE in the last 168 hours. No results for input(s): AMMONIA in the last 168 hours. Coagulation Profile:  Recent Labs Lab 01/06/17 1506 01/07/17 0152 01/08/17 0310 01/09/17 0355 01/10/17 0348  INR 1.66 1.44 1.34 1.51 1.67   Cardiac Enzymes: No results for input(s): CKTOTAL, CKMB, CKMBINDEX, TROPONINI in the last 168 hours. BNP (last 3 results) No results for input(s): PROBNP in the last 8760 hours. HbA1C: No results for input(s): HGBA1C in the last 72 hours. CBG: No results for input(s): GLUCAP in the last 168 hours. Lipid Profile: No results for input(s): CHOL, HDL, LDLCALC, TRIG, CHOLHDL, LDLDIRECT in the last 72 hours. Thyroid Function Tests: No results for input(s): TSH, T4TOTAL, FREET4, T3FREE, THYROIDAB in the last 72 hours. Anemia Panel: No results for input(s): VITAMINB12, FOLATE, FERRITIN, TIBC, IRON, RETICCTPCT in the last 72 hours. Urine analysis:  Sepsis Labs: @LABRCNTIP (procalcitonin:4,lacticidven:4)   Recent Results (from the past 240 hour(s))  MRSA PCR Screening     Status: None   Collection Time: 01/06/17  9:27 AM  Result Value Ref Range Status   MRSA by PCR NEGATIVE NEGATIVE Final      Radiology Studies: Ct Chest Wo Contrast Result Date: 01/06/2017 1. Cardiomegaly, coronary artery disease and thoracic atherosclerosis. 2. Enlarged pulmonary artery, consistent with pulmonary arterial  hypertension. 3. Right pleural effusion. Airspace filling opacities, consistent with infectious process in the right lower lobe and upper lobes. 4. Pleural calcifications, consistent with asbestos exposure. 5. Cavitary lesion in the left upper lobe. 6. Paraseptal emphysema. 7. Large hiatal hernia. 8. Cirrhosis of the liver. 9. Suspect splenomegaly and portal venous hypertension. 10. Left renal mass. Based on previous imaging, this  likely represented a cyst but by today's imaging is indeterminate. Consider renal ultrasound for further characterization. 11. Bilateral intrarenal calculi. Aortic Atherosclerosis (ICD10-I70.0) and Emphysema (ICD10-J43.9).   Dg Abd Acute W/chest Result Date: 01/06/2017 Nonobstructed bowel gas pattern. Patchy consolidation within the right mid and lower lung which may represent combination of atelectasis and scarring within the right lung base. Superimposed infectious process not excluded. Recommend further evaluation with chest CT to exclude underlying pulmonary mass.   Dg Chest Port 1 View Result Date: 01/09/2017 1. Complete opacification of the right hemithorax has worsened in the interval. Recommend clinical correlation and follow-up to resolution. 2. Diffuse interstitial opacities in the left lung are worse in the interval most consistent with edema.     Scheduled Meds: . arformoterol  15 mcg Nebulization BID  . budesonide  0.5 mg Nebulization BID  . folic acid  1 mg Oral Daily  . ipratropium-albuterol  3 mL Nebulization Q4H  . methylPREDNISolone (SOLU-MEDROL) injection  60 mg Intravenous Q24H  . pantoprazole (PROTONIX) IV  40 mg Intravenous Q12H  . sertraline  100 mg Oral QHS  . sodium chloride flush  3 mL Intravenous Q12H   Continuous Infusions: . ampicillin-sulbactam (UNASYN) IV 3 g (01/10/17 1421)     LOS: 4 days    Time spent: 25 minutes  Greater than 50% of the time spent on counseling and coordinating the care.   Leisa Lenz, MD [facility] Pager [phone]  If 7PM-7AM, please contact night-coverage [email or web address] Password [email or web address] 01/10/2017, 3:01 PM

## 2017-01-10 NOTE — Evaluation (Signed)
Physical Therapy Evaluation Patient Details Name: Gary Boyer MRN: [id number] DOB: [birth year] Today's Date: 01/10/2017   History of Present Illness  Pt admitted from home with upper GIB/anemia and hx of COPD, LE edema, PE, L TKR and falls.    Clinical Impression  Pt admitted as above and presenting with functional mobility limitations 2* significant generalized weakness and balance deficits.  Pt would benefit from follow up rehab at SNF level to maximize IND and safety prior to return home.    Follow Up Recommendations SNF    Equipment Recommendations  None recommended by PT    Recommendations for Other Services OT consult     Precautions / Restrictions Precautions Precautions: Fall Restrictions Weight Bearing Restrictions: No      Mobility  Bed Mobility Overal bed mobility: Needs Assistance Bed Mobility: Supine to Sit;Sit to Supine     Supine to sit: +2 for safety/equipment;+2 for physical assistance;Mod assist Sit to supine: Mod assist;+2 for physical assistance;+2 for safety/equipment   General bed mobility comments: Increased time with cues for sequence and assist to manage bil LE and trunk  Transfers                 General transfer comment: Pt sat at EOB x 8 min progressing to supervision with use of UEs to self assist for balance.  Pt unwilling to attempt standing 2* fatigue and "my butt hurts"  Ambulation/Gait                Stairs            Wheelchair Mobility    Modified Rankin (Stroke Patients Only)       Balance Overall balance assessment: Needs assistance Sitting-balance support: Bilateral upper extremity supported;Feet supported Sitting balance-Leahy Scale: Poor                                       Pertinent Vitals/Pain Pain Assessment: Faces Faces Pain Scale: Hurts little more Pain Location: buttocks with sitting Pain Descriptors / Indicators: Sore Pain Intervention(s): Limited activity within  patient's tolerance;Monitored during session    Home Living Family/patient expects to be discharged to:: Private residence Living Arrangements: Spouse/significant other Available Help at Discharge: Family Type of Home: House Home Access: Stairs to enter Entrance Stairs-Rails: None Technical brewer of Steps: 3 Home Layout: Two level Home Equipment: Environmental consultant - 2 wheels      Prior Function Level of Independence: Needs assistance         Comments: Pt states he has been in bed for the better part of a month     Hand Dominance        Extremity/Trunk Assessment   Upper Extremity Assessment Upper Extremity Assessment: Generalized weakness    Lower Extremity Assessment Lower Extremity Assessment: Generalized weakness       Communication   Communication: No difficulties  Cognition Arousal/Alertness: Awake/alert Behavior During Therapy: WFL for tasks assessed/performed Overall Cognitive Status: No family/caregiver present to determine baseline cognitive functioning                                        General Comments      Exercises     Assessment/Plan    PT Assessment Patient needs continued PT services  PT Problem List Decreased strength;Decreased range of motion;Decreased activity tolerance;Decreased balance;Decreased  mobility;Decreased cognition;Decreased knowledge of use of DME;Pain;Obesity       PT Treatment Interventions DME instruction;Gait training;Functional mobility training;Therapeutic activities;Therapeutic exercise;Patient/family education    PT Goals (Current goals can be found in the Care Plan section)  Acute Rehab PT Goals Patient Stated Goal: Get back to bed PT Goal Formulation: With patient Time For Goal Achievement: 01/23/17 Potential to Achieve Goals: Fair    Frequency Min 3X/week   Barriers to discharge        Co-evaluation               AM-PAC PT "6 Clicks" Daily Activity  Outcome Measure Difficulty  turning over in bed (including adjusting bedclothes, sheets and blankets)?: Total Difficulty moving from lying on back to sitting on the side of the bed? : Total Difficulty sitting down on and standing up from a chair with arms (e.g., wheelchair, bedside commode, etc,.)?: Total Help needed moving to and from a bed to chair (including a wheelchair)?: A Lot Help needed walking in hospital room?: Total Help needed climbing 3-5 steps with a railing? : Total 6 Click Score: 7    End of Session   Activity Tolerance: Patient limited by fatigue Patient left: in bed;with call bell/phone within reach Nurse Communication: Mobility status PT Visit Diagnosis: Difficulty in walking, not elsewhere classified (R26.2);Muscle weakness (generalized) (M62.81);History of falling (Z91.81)    Time: 1100-1133 PT Time Calculation (min) (ACUTE ONLY): 33 min   Charges:   PT Evaluation $PT Eval Moderate Complexity: 1 Procedure PT Treatments $Therapeutic Activity: 8-22 mins   PT G Codes:        Pg [phone]   Durell Lofaso 01/10/2017, 12:58 PM

## 2017-01-11 ENCOUNTER — Encounter (HOSPITAL_COMMUNITY): Payer: Self-pay | Admitting: Physician Assistant

## 2017-01-11 ENCOUNTER — Encounter (HOSPITAL_COMMUNITY)
Admission: EM | Disposition: A | Discharge status: Home / Self Care | Payer: Self-pay | Source: Home / Self Care | Attending: Internal Medicine

## 2017-01-11 ENCOUNTER — Inpatient Hospital Stay (HOSPITAL_COMMUNITY): Payer: Medicare Other | Admitting: Anesthesiology

## 2017-01-11 DIAGNOSIS — K209 Esophagitis, unspecified without bleeding: Secondary | ICD-10-CM

## 2017-01-11 DIAGNOSIS — K31811 Angiodysplasia of stomach and duodenum with bleeding: Principal | ICD-10-CM

## 2017-01-11 DIAGNOSIS — R195 Other fecal abnormalities: Secondary | ICD-10-CM

## 2017-01-11 HISTORY — PX: ESOPHAGOGASTRODUODENOSCOPY (EGD) WITH PROPOFOL: SHX5813

## 2017-01-11 LAB — BASIC METABOLIC PANEL
ANION GAP: 6 (ref 5–15)
BUN: 40 mg/dL — AB (ref 6–20)
CALCIUM: 8.6 mg/dL — AB (ref 8.9–10.3)
CO2: 30 mmol/L (ref 22–32)
Chloride: 106 mmol/L (ref 101–111)
Creatinine, Ser: 1.57 mg/dL — ABNORMAL HIGH (ref 0.61–1.24)
GFR calc Af Amer: 47 mL/min — ABNORMAL LOW (ref >60)
GFR calc non Af Amer: 40 mL/min — ABNORMAL LOW (ref >60)
GLUCOSE: 124 mg/dL — AB (ref 65–99)
Potassium: 4.9 mmol/L (ref 3.5–5.1)
Sodium: 142 mmol/L (ref 135–145)

## 2017-01-11 LAB — PROTIME-INR
INR: 1.73
Prothrombin Time: 20.4 seconds — ABNORMAL HIGH (ref 11.4–15.2)

## 2017-01-11 LAB — CBC
HEMATOCRIT: 29.7 % — AB (ref 39.0–52.0)
Hemoglobin: 8.6 g/dL — ABNORMAL LOW (ref 13.0–17.0)
MCH: 23.4 pg — AB (ref 26.0–34.0)
MCHC: 29 g/dL — ABNORMAL LOW (ref 30.0–36.0)
MCV: 80.7 fL (ref 78.0–100.0)
PLATELETS: 190 10*3/uL (ref 150–400)
RBC: 3.68 MIL/uL — ABNORMAL LOW (ref 4.22–5.81)
RDW: 19.4 % — ABNORMAL HIGH (ref 11.5–15.5)
WBC: 5.4 10*3/uL (ref 4.0–10.5)

## 2017-01-11 LAB — PREPARE RBC (CROSSMATCH)

## 2017-01-11 SURGERY — ESOPHAGOGASTRODUODENOSCOPY (EGD) WITH PROPOFOL
Anesthesia: General

## 2017-01-11 MED ORDER — SODIUM CHLORIDE 0.9 % IV SOLN: INTRAVENOUS | Status: DC

## 2017-01-11 MED ORDER — ETOMIDATE 2 MG/ML IV SOLN
INTRAVENOUS | Status: DC | PRN
  Administered 2017-01-11: 4 mg via INTRAVENOUS

## 2017-01-11 MED ORDER — LIDOCAINE 2% (20 MG/ML) 5 ML SYRINGE
INTRAMUSCULAR | Status: AC
  Filled 2017-01-11: qty 5

## 2017-01-11 MED ORDER — SODIUM CHLORIDE 0.9 % IJ SOLN
PREFILLED_SYRINGE | INTRAMUSCULAR | Status: DC | PRN
  Administered 2017-01-11: 3 mL

## 2017-01-11 MED ORDER — ETOMIDATE 2 MG/ML IV SOLN
INTRAVENOUS | Status: AC
  Filled 2017-01-11: qty 10

## 2017-01-11 MED ORDER — PHENYLEPHRINE HCL 10 MG/ML IJ SOLN
INTRAMUSCULAR | Status: DC | PRN
  Administered 2017-01-11: 25 ug/min via INTRAVENOUS

## 2017-01-11 MED ORDER — EPINEPHRINE PF 1 MG/10ML IJ SOSY
PREFILLED_SYRINGE | INTRAMUSCULAR | Status: AC
  Filled 2017-01-11: qty 10

## 2017-01-11 MED ORDER — FENTANYL CITRATE (PF) 100 MCG/2ML IJ SOLN
INTRAMUSCULAR | Status: AC
  Filled 2017-01-11: qty 2

## 2017-01-11 MED ORDER — ROCURONIUM BROMIDE 50 MG/5ML IV SOSY
PREFILLED_SYRINGE | INTRAVENOUS | Status: AC
  Filled 2017-01-11: qty 5

## 2017-01-11 MED ORDER — PROPOFOL 500 MG/50ML IV EMUL
INTRAVENOUS | Status: DC | PRN
  Administered 2017-01-11: 20 ug/kg/min via INTRAVENOUS

## 2017-01-11 MED ORDER — LACTATED RINGERS IV SOLN
INTRAVENOUS | Status: DC
  Administered 2017-01-11: 11:00:00 via INTRAVENOUS

## 2017-01-11 MED ORDER — SUCCINYLCHOLINE CHLORIDE 200 MG/10ML IV SOSY
PREFILLED_SYRINGE | INTRAVENOUS | Status: AC
  Filled 2017-01-11: qty 10

## 2017-01-11 MED ORDER — PANTOPRAZOLE SODIUM 40 MG PO TBEC
40.0000 mg | DELAYED_RELEASE_TABLET | Freq: Two times a day (BID) | ORAL | Status: DC
  Administered 2017-01-11 – 2017-01-22 (×23): 40 mg via ORAL
  Filled 2017-01-11 (×23): qty 1

## 2017-01-11 MED ORDER — ONDANSETRON HCL 4 MG/2ML IJ SOLN
INTRAMUSCULAR | Status: AC
Start: 2017-01-11 — End: 2017-01-11
  Filled 2017-01-11: qty 2

## 2017-01-11 MED ORDER — MEPERIDINE HCL 25 MG/ML IJ SOLN: 6.2500 mg | INTRAMUSCULAR | Status: DC | PRN

## 2017-01-11 MED ORDER — ONDANSETRON HCL 4 MG/2ML IJ SOLN: 4.0000 mg | Freq: Once | INTRAMUSCULAR | Status: DC | PRN

## 2017-01-11 MED ORDER — LACTATED RINGERS IV SOLN
INTRAVENOUS | Status: DC | PRN
  Administered 2017-01-11: 10:00:00 via INTRAVENOUS

## 2017-01-11 MED ORDER — PROPOFOL 10 MG/ML IV BOLUS
INTRAVENOUS | Status: AC
  Filled 2017-01-11: qty 20

## 2017-01-11 MED ORDER — FENTANYL CITRATE (PF) 100 MCG/2ML IJ SOLN: 25.0000 ug | INTRAMUSCULAR | Status: DC | PRN

## 2017-01-11 SURGICAL SUPPLY — 14 items

## 2017-01-11 NOTE — Anesthesia Preprocedure Evaluation (Signed)
Anesthesia Evaluation  Patient identified by MRN, date of birth, ID band Patient awake  General Assessment Comment:Past Medical History Diagnosis Date . Dysrhythmia    PAST HX OF HEART ABLATION FOR IRREGULAR HEART RATE --SUCCESSFUL-NO FURTHER PROB WITH IRREGULAR HEART BEAT . COPD (chronic obstructive pulmonary disease)    HX OF SMOKING FOR 60 YRS-PT SEES DR. Winona Legato IN [place]  . Shortness of breath    WITH ANY EXERTION-USES OXYGEN AS NEEDED . Pneumonia    IN THE PAST - BUT NOT IN THE LAST YR . Asthma  . Pulmonary embolus 2011 OR 2012   CHRONIC WARFARIN SINCE THE BLOOD CLOT . Bilateral leg edema    WEARS SUPPORT HOSE . History of kidney stones    SOME SMALL STONES AT PRESENT TIME- TAKES POTASSIUM CITRATE . H/O hiatal hernia  . GERD (gastroesophageal reflux disease)  . Cancer    SKIN CANCERS . Arthritis    ALL OVER . Sleep apnea    USES BIPAP SETTING 16 / 11 - PER PULMONOGIST'S OFFICE NOTE       Reviewed: Allergy & Precautions, NPO status , Patient's Chart, lab work & pertinent test results  Airway Mallampati: II  TM Distance: >3 FB Neck ROM: Full    Dental no notable dental hx.    Pulmonary shortness of breath, asthma , sleep apnea, Continuous Positive Airway Pressure Ventilation and Oxygen sleep apnea , pneumonia, resolved, COPD,  COPD inhaler and oxygen dependent, former smoker,    Pulmonary exam normal breath sounds clear to auscultation       Cardiovascular Exercise Tolerance: Poor hypertension, Pt. on medications Normal cardiovascular exam+ dysrhythmias  Rhythm:Regular Rate:Normal     Neuro/Psych negative neurological ROS  negative psych ROS   GI/Hepatic Neg liver ROS, hiatal hernia, GERD  Medicated,  Endo/Other  Morbid obesity  Renal/GU Renal InsufficiencyRenal diseaseCr 1.44  negative genitourinary   Musculoskeletal  (+)  Arthritis ,   Abdominal (+) + obese,   Peds negative pediatric ROS (+)  Hematology  (+) anemia ,   Anesthesia Other Findings Echo  - Normal LV systolic function; mild LVE; moderately calcified   aortic valve with fixed left coronary cusp and very mild AS; mild   MR; moderate LAE; mild RAE and RVE; mild TR with mildly elevated   pulmonary pressure.  Reproductive/Obstetrics negative OB ROS                             Anesthesia Physical  Anesthesia Plan  ASA: IV and emergent  Anesthesia Plan: General   Post-op Pain Management:    Induction: Intravenous  PONV Risk Score and Plan: 2 and Ondansetron and Dexamethasone  Airway Management Planned: Nasal Cannula and Natural Airway  Additional Equipment:   Intra-op Plan:   Post-operative Plan: Post-operative intubation/ventilation  Informed Consent: I have reviewed the patients History and Physical, chart, labs and discussed the procedure including the risks, benefits and alternatives for the proposed anesthesia with the patient or authorized representative who has indicated his/her understanding and acceptance.   Dental advisory given  Plan Discussed with: CRNA  Anesthesia Plan Comments: (Plan postoperative ventilation. Chronic respiratory failure on home oxygen, morbid obesity suggest increased pulmonary risk.)        Anesthesia Quick Evaluation

## 2017-01-11 NOTE — Op Note (Signed)
[facility] Patient Name: Gary Boyer Procedure Date: 01/11/2017 MRN: [id number] Attending MD: Jerene Bears , MD Date of Birth: 09-Nov-1935 CSN: [id number] Age: 81 Admit Type: Inpatient Procedure:                Upper GI endoscopy Indications:              Acute post hemorrhagic anemia, Hematemesis, Heme                            positive stool Providers:                Lajuan Lines. Hilarie Fredrickson, MD, Cleda Daub, RN, Cherylynn Ridges, Technician, Arnoldo Hooker, CRNA Referring MD:             [facility] Medicines:                Monitored Anesthesia Care Complications:            No immediate complications. Estimated Blood Loss:     Estimated blood loss was minimal. Procedure:                Pre-Anesthesia Assessment:                           - Prior to the procedure, a History and Physical                            was performed, and patient medications and                            allergies were reviewed. The patient's tolerance of                            previous anesthesia was also reviewed. The risks                            and benefits of the procedure and the sedation                            options and risks were discussed with the patient.                            All questions were answered, and informed consent                            was obtained. Prior Anticoagulants: The patient has                            taken Coumadin (warfarin), last dose was 5 days                            prior to procedure. ASA Grade Assessment: III - A  patient with severe systemic disease. After                            reviewing the risks and benefits, the patient was                            deemed in satisfactory condition to undergo the                            procedure.                           After obtaining informed consent, the endoscope was                            passed under direct  vision. Throughout the                            procedure, the patient's blood pressure, pulse, and                            oxygen saturations were monitored continuously. The                            EG-[id number] ([id number]) scope was introduced through the                            mouth, and advanced to the second part of duodenum.                            The upper GI endoscopy was accomplished without                            difficulty. The patient tolerated the procedure                            well. Scope In: Scope Out: Findings:      LA Grade D (one or more mucosal breaks involving at least 75% of       esophageal circumference) esophagitis was found in the lower third of       the esophagus. Esophagitis was friable with contact bleeding which       stopped spontaneously.      A large, approximately 5 cm, hiatal hernia was present.      Three 4 to 7 mm bleeding angiodysplastic lesions were found on the       lesser curvature of the gastric body. One of these lesions was actively       bleeding. Area around each lesion was successfully injected with 1 mL of       a 1:10,000 solution of epinephrine for hemostasis and pre-treatment       effect (3 mL epinephrine total). Fulguration to ablate the lesions by       argon plasma at 1 liter/minute and 20 watts was successful. The largest       (middle) lesion continued to ooze after treatment and three hemostatic  clips were successfully placed (MR conditional). There was no bleeding       at the end of the procedure.      The exam of the stomach was otherwise normal.      Lymphangiectasia was present in the second portion of the duodenum.      The exam of the duodenum was otherwise normal. Impression:               - LA Grade D erosive esophagitis in the distal                            esophagus (unclear if this was present before or                            after recent critical illness).                            - Large hiatal hernia.                           - Three angiodysplastic lesions in the stomach, 1                            actively bleeding. Injected. Treated with argon                            plasma coagulation (APC). Clips (MR conditional)                            were placed. These lesions certainly could explain                            acute blood loss and hematemesis, especially in the                            setting of warfarin therapy and psychotherapeutic                            INR                           - Duodenal mucosal lymphangiectasia (benign).                           - Examined duodenum otherwise normal. Moderate Sedation:      N/A Recommendation:           - Return patient to hospital ward for ongoing care.                           - Continue present medications, but would hold                            anticoagulation given esophagitis and post                            intervention in the stomach.  Time to resume                            anticoagulation difficult to determine and will                            need close monitoring of hemoglobin when                            anticoagulation is reinitiated.                           - Twice a day PPI                           - Can advance diet as tolerated Procedure Code(s):        --- Professional ---                           [phone], Esophagogastroduodenoscopy, flexible,                            transoral; with control of bleeding, any method Diagnosis Code(s):        --- Professional ---                           K20.8, Other esophagitis                           K44.9, Diaphragmatic hernia without obstruction or                            gangrene                           K31.811, Angiodysplasia of stomach and duodenum                            with bleeding                           I89.0, Lymphedema, not elsewhere classified                           D62, Acute posthemorrhagic  anemia                           K92.0, Hematemesis                           R19.5, Other fecal abnormalities CPT copyright 2016 [employer]. All rights reserved. The codes documented in this report are preliminary and upon coder review may  be revised to meet current compliance requirements. Jerene Bears, MD 01/11/2017 12:04:37 PM This report has been signed electronically. Number of Addenda: 0

## 2017-01-11 NOTE — Progress Notes (Signed)
PULMONARY / CRITICAL CARE MEDICINE   Name: Gary Boyer MRN: [id number] DOB: March 20, 1936    ADMISSION DATE:  01/06/2017 CONSULTATION DATE:  01/10/2017  REFERRING MD:  Dr. Rogers Blocker  CHIEF COMPLAINT:  Chest x-ray abnormality  BRIEF SUMMARY:  81 y/o M admitted 7/4 with coffee ground emesis.  Pt was pending an EGD.  He had a CXR that showed R sided opacification which prompted pulmonary evaluation.  He improved with chest PT and mucomyst.  Underwent bronchoscopy on 7/8 with removal of thick mucoid secretions.    PMH - A. fib, PE on Coumadin, chronic CHF.   SUBJECTIVE:  RN reports O2 @ 10L HFNC.  Pt reports he "coughs all the time" but can not identify if coughs/chokes with eating.  No further bleeding overnight.   VITAL SIGNS: BP (!) 151/102   Pulse 71   Temp 97.9 F (36.6 C) (Oral)   Resp 20   Ht 6' (1.829 m)   Wt 263 lb 10.7 oz (119.6 kg)   SpO2 100%   BMI 35.76 kg/m   VENTILATOR SETTINGS: FiO2 (%):  [36 %-40 %] 40 %  INTAKE / OUTPUT: I/O last 3 completed shifts: In: 1000 [P.O.:500; IV Piggyback:500] Out: 1330 [Urine:1330]  PHYSICAL EXAMINATION: General:  Chronically ill appearing male in NAD, sitting up in bed eating breakfast HEENT: MM pink/moist, poor dentition PSY: calm/appropriate Neuro: AAOx4, speech clear, MAE CV: s1s2 rrr, no m/r/g PULM: even/non-labored, lungs bilaterally coarse EL:TRVU, non-tender, bsx4 active  Extremities: warm/dry, BLE 1+ edema, minimal lower Skin: no rashes or lesions   LABS:  BMET  Recent Labs Lab 01/09/17 0355 01/10/17 0348 01/11/17 0317  NA 142 144 142  K 4.3 4.4 4.9  CL 104 106 106  CO2 29 34* 30  BUN 33* 39* 40*  CREATININE 1.51* 1.65* 1.57*  GLUCOSE 105* 123* 124*    Electrolytes  Recent Labs Lab 01/07/17 0152 01/09/17 0355 01/10/17 0348 01/11/17 0317  CALCIUM 8.6* 8.7* 8.8* 8.6*  MG 2.0  --   --   --     CBC  Recent Labs Lab 01/09/17 0355 01/10/17 0348 01/11/17 0317  WBC 7.8 8.1 5.4  HGB 8.6* 8.8*  8.6*  HCT 29.5* 30.6* 29.7*  PLT 208 184 190    Coag's  Recent Labs Lab 01/06/17 0628  01/09/17 0355 01/10/17 0348 01/11/17 0317  APTT 84*  --   --   --   --   INR  --   < > 1.51 1.67 1.73  < > = values in this interval not displayed.  Sepsis Markers No results for input(s): LATICACIDVEN, PROCALCITON, O2SATVEN in the last 168 hours.  ABG No results for input(s): PHART, PCO2ART, PO2ART in the last 168 hours.  Liver Enzymes  Recent Labs Lab 01/06/17 0522 01/07/17 0152  AST 21 15  ALT 9* 8*  ALKPHOS 61 56  BILITOT 0.4 0.9  ALBUMIN 2.1* 2.2*    Cardiac Enzymes No results for input(s): TROPONINI, PROBNP in the last 168 hours.  Glucose No results for input(s): GLUCAP in the last 168 hours.  Imaging Dg Chest Port 1 View  Result Date: 01/10/2017 CLINICAL DATA:  Atelectasis.  Shortness of breath EXAM: PORTABLE CHEST 1 VIEW COMPARISON:  Study obtained earlier in the day and January 09, 2017 FINDINGS: There is stable consolidation in the right upper lobe and right base regions. There is a small right pleural effusion. There is also patchy atelectasis in the left base. No new opacity compared earlier in the day. Heart  is mildly enlarged with pulmonary vascularity within normal limits. No evident adenopathy. There is arthropathy in each shoulder. IMPRESSION: Areas of consolidation and volume loss on the right. Small right pleural effusion. Patchy atelectasis left base. No new opacity compared to earlier in the day. Stable cardiac silhouette. Electronically Signed   By: Lowella Grip III M.D.   On: 01/10/2017 17:51   Dg Chest Port 1 View  Result Date: 01/10/2017 CLINICAL DATA:  Atelectasis.  Shortness breath. EXAM: PORTABLE CHEST 1 VIEW COMPARISON:  01/09/2017 FINDINGS: The left costophrenic angle is not included. Technologist note indicates: "ordering MD did not needed the image to be repeated to include costophrenic angles." There is significantly improved aeration in the right  hemithorax. Continued volume loss on the right with shift of cardiac and mediastinal structures to the right. This is difficult to quantify given the rightward rotation of the chest during imaging. Continued obscuration of the right hemidiaphragm possibly from layering effusion, atelectasis, or pneumonia. Poor definition of the right mediastinal margin. Vague densities at the left lung base. Indistinct pulmonary vasculature. Potential enlargement of the cardiopericardial silhouette. Considerable degenerative arthropathy of the right glenohumeral joint. Reduced acromiohumeral distance on the right suspicious for chronic rotator cuff tear. IMPRESSION: 1. Interval improved aeration in the right hemithorax, but with continued considerable airspace opacity especially at the right lung base and adjacent to the right mediastinum. Cannot exclude layering right pleural effusion. 2. Indistinct airspace opacity in left lower lobe, potentially from edema or pneumonia. 3. Suspected cardiomegaly. Indistinct pulmonary vasculature suggesting pulmonary venous hypertension and potentially mild interstitial edema. 4. Chronic right rotator cuff tear with advanced degenerative glenohumeral arthropathy on the right. Electronically Signed   By: Van Clines M.D.   On: 01/10/2017 17:20   STUDIES: EGD 7/9 >> LA Grade D (one or more mucosal breaks involving at least 75% of esophageal circumference) esophagitis was found in the lower third of the esophagus. Esophagitis was friable with contact bleeding which stopped spontaneously.  A large, approximately 5 cm, hiatal hernia was present. Three 4 to 7 mm bleeding angiodysplastic lesions were found on the lesser curvature of the gastric body.  One of these lesions was actively bleeding. Area around each lesion injected with epinephrine.  The largest (middle) lesion continued to ooze after treatment and three hemostatic clips were successfully placed (MR conditional). There was no  bleeding at the end of the procedure.  EVENTS:  7/04  Admit with coffee ground emesis  7/07  R hemothorax opacification 7/08  PCCM consulted for R sided hemithorax opacification improved with chest PT, mucomyst.  FOB completed   CULTURES: MRSA PCR 7/4 >> negative Sputum / BAL 7/8 >> mod WBC >>  ANTIBIOTICS: Unasyn 7/4 >>     ASSESSMENT / PLAN:  PULMONARY A: Atelectasis Right PNA / Suspected Aspiration - modest improvement with chest PT/mucomyst, s/p FOB 7/8 P:   Aggressive pulmonary hygiene - IS/flutter, mobilize Chest PT with vest Empiric abx as above  Continue Mucomyst BID for now, consider d/c 7/11 Follow CXR  ABX as above, D7/7  CARDIOVASCULAR A:  A. Fib - CHADS VASC 4 Chronic diastolic CHF P:  Anticoagulation per primary / GI > currently on hold with bleeding  SDU monitoring   RENAL A:   CKD4 P:   Per Primary   GASTROINTESTINAL A:   GIB - PUD vs gastritis, EGD >> esophagitis, vascular ectasias P:   BID PPI Per GI / Primary MD   HEMATOLOGIC A:   Acute Blood Loss  Anemia - in setting of GI bleeding, required 4 units PRBC, 2 unit FFP since admit P:  Trend CBC  Transfuse for Hgb <7%   INFECTIOUS A:   Aspiration pneumonia P:   Follow BAL cultures    FAMILY  - Updates: Patient updated at bedside on plan of care.    Noe Gens, NP-C [facility] Pgr: [phone] or if no answer [phone] 01/11/2017, 10:29 AM   Attending Note:  I have examined patient, reviewed labs, studies and notes. I have discussed the case with B Ollis, and I agree with the data and plans as amended above. 81 year old man with atrial fibrillation previously anticoagulated. He presented with coffee-ground emesis complicated by a probable aspiration event. He developed a right sided opacification consistent with aspiration pneumonia. Bronchoscopy revealed purulent secretions but cultures are negative so far. He underwent EGD on 7/9 that showed distal  esophagitis and some vascular ectasias that were probably the source of his blood loss. On evaluation today he remains on high flow nasal cannula, continues to have cough. Tolerating a by mouth diet. He has bilateral coarse breath sounds. His chest x-ray was reviewed, shows persistent diffuse right-sided infiltrate. Would continue to hold his anticoagulation, use PPI as per GI recommendations. Continue Unasyn, planned for a seven-day course (completes on 7/10). Likely discontinue Mucomyst on 7/11. Continue to wean his oxygen. He needs to mobilize. Push pulmonary hygiene.   Baltazar Apo, MD, PhD 01/12/2017, 9:39 AM [facility] [phone] or if no answer [phone]

## 2017-01-11 NOTE — Anesthesia Postprocedure Evaluation (Signed)
Anesthesia Post Note  Patient: Gary Boyer  Procedure(s) Performed: Procedure(s) (LRB): ESOPHAGOGASTRODUODENOSCOPY (EGD) WITH PROPOFOL (N/A)     Patient location during evaluation: PACU Anesthesia Type: General Level of consciousness: awake and alert Pain management: pain level controlled Vital Signs Assessment: post-procedure vital signs reviewed and stable Respiratory status: spontaneous breathing, nonlabored ventilation, respiratory function stable and patient connected to nasal cannula oxygen Cardiovascular status: stable and blood pressure returned to baseline Anesthetic complications: no    Last Vitals:  Vitals:   01/11/17 1200 01/11/17 1210  BP: 129/69 (!) 126/56  Pulse: 68 73  Resp: 19 (!) 24  Temp:      Last Pain:  Vitals:   01/11/17 1157  TempSrc: Oral  PainSc:                  [name]

## 2017-01-11 NOTE — Progress Notes (Signed)
PT Cancellation Note  Patient Details Name: Dawit Tankard MRN: [id number] DOB: 1935-07-10   Cancelled Treatment:    Reason Eval/Treat Not Completed: Patient at procedure or test/unavailable. Will check back as schedule allows-likely another day.    Weston Anna, MPT Pager: [phone]

## 2017-01-11 NOTE — Transfer of Care (Signed)
Immediate Anesthesia Transfer of Care Note  Patient: Gary Boyer  Procedure(s) Performed: Procedure(s): ESOPHAGOGASTRODUODENOSCOPY (EGD) WITH PROPOFOL (N/A)  Patient Location: PACU  Anesthesia Type:MAC  Level of Consciousness:  sedated, patient cooperative and responds to stimulation  Airway & Oxygen Therapy:Patient Spontanous Breathing and Patient connected to face mask oxgen  Post-op Assessment:  Report given to PACU RN and Post -op Vital signs reviewed and stable  Post vital signs:  Reviewed and stable  Last Vitals:  Vitals:   01/11/17 0800 01/11/17 1025  BP: (!) 151/102 (!) 147/59  Pulse: 71 76  Resp: 20 (!) 21  Temp: 36.6 C 56.7 C    Complications: No apparent anesthesia complications

## 2017-01-11 NOTE — Progress Notes (Signed)
RT placed patient on CPAP. Patient tolerated for about 10 minutes and oxygen dropped to the low 80's. RT went to get servo I and placed patient on BIPAP setting and due to his beard could not get a good seal. RT placed patient back on [place] salter and sats are now at 97%. RN aware. RT will continue to monitor.

## 2017-01-11 NOTE — Progress Notes (Signed)
Patient ID: Gary Boyer, male   DOB: 04/01/1936, 81 y.o.   MRN: [id number]  PROGRESS NOTE    Gary Boyer  KVQ:[id number] DOB: Jun 01, 1936 DOA: 01/06/2017  PCP: [name], MD   Brief Narrative:  81 year old male with history of a fib, PE, on coumadin, chronic CHF, COPD. Pt presented to [facility] ED with coffee-ground emesis started the night prior to the admission. Pt was recently treated with doxycyline and prednisone for UTI.  On admission, hgb was 7, pt was hypotensive and FOBT was positive. GI consulted. Patient has so far received 4 units of PRBC and 2 units of FFP transfusion. There is no overt GI bleed since admission. Patient has chronic breathing trouble and he supposed to be on home oxygen. He has required Ventimask and nasal cannula to keep oxygen saturation above 90%.  Plan was for EGD however pt had worsening CXR with complete opacification of right hemithorax so pt underwent bronchoscopy 7/8.   Assessment & Plan:   Principal Problem: Acute upper GI bleed / Acute blood loss anemia - Hgb 6.9 on admission - FOBT was positive - S/P 4 U PRBC and 2 U FFP transfusion during this hospital stay - Continue PPI therapy - Plan for EGD today   Active Problems:   Acute respiratory failure with hypoxia / Acute COPD exacerbation  - CXR 7/7 showed complete opacification of the right hemithorax, has worsened since prior study - Pt seen by pulmonary, s/p bronchoscopy 7/8 with some clearing up of the right heimthorax based on CXR 7/8 after bronch - Pt given lasix x once 7/8 20 mg IV - Stopped steroids 7/8 per pulmonary - Added mucomyst and chest PT  Morbid obesity due to excess calories ([place]) - Counseled on nutrition   Atrial fibrillation (HCC) - CHADS vasc score 4 - Given 10 mg IV vitamin K to reverse INR - AC On hold due to risk of bleeding  Chronic diastolic CHF - Stable - Compensated   Pulmonary embolism (HCC)  - AC on hold due to risk of bleeding   CKD  stage 4 - Cr in 2016 as high as 2.9 - Cr within baseline values  Aspiration pneumonia of right lower lobe (HCC) - Pt on Unasyn   Protein calorie malnutrition, moderate - In the context of chronic illness   Depression - Continue sertraline      DVT prophylaxis: SCD's Code Status: full code  Family Communication: no family at the bedside  Disposition Plan: remains in SDU after recent bronch, monitoring resp status, EGD today    Consultants:   GI, Dr. Owens Loffler  Pulmonary   Procedures:   Bronchoscopy 7/8   Antimicrobials:   Unasyn 7/4 -->   Subjective: No overnight events.  Objective: Vitals:   01/11/17 0500 01/11/17 0600 01/11/17 0749 01/11/17 0800  BP:  (!) 153/77  (!) 151/102  Pulse: 75 69  71  Resp: (!) 21 (!) 24  20  Temp:    97.9 F (36.6 C)  TempSrc:    Oral  SpO2: 96% 97% 96% 100%  Weight: 119.6 kg (263 lb 10.7 oz)     Height:        Intake/Output Summary (Last 24 hours) at 01/11/17 0945 Last data filed at 01/11/17 0500  Gross per 24 hour  Intake              700 ml  Output             1010 ml  Net             -310 ml   Filed Weights   01/09/17 0124 01/10/17 0500 01/11/17 0500  Weight: 118.8 kg (261 lb 14.5 oz) 118.5 kg (261 lb 3.9 oz) 119.6 kg (263 lb 10.7 oz)    Examination:  General exam: Appears calm and comfortable  Respiratory system: Coarse breath sounds, wheezing in upper lung lobes  Cardiovascular system: S1 & S2 heard, Rate controlled  Gastrointestinal system: Abdomen is obese, (+) BS, non tender  Central nervous system: No focal neurological deficits. Extremities: Symmetric 5 x 5 power. Skin: No rashes, lesions or ulcers Psychiatry: Mood & affect appropriate.   Data Reviewed: I have personally reviewed following labs and imaging studies  CBC:  Recent Labs Lab 01/07/17 0152 01/07/17 0758 01/07/17 1757 01/09/17 0355 01/10/17 0348 01/11/17 0317  WBC 7.0 7.8  --  7.8 8.1 5.4  HGB 7.7* 7.8* 9.2* 8.6*  8.8* 8.6*  HCT 25.8* 26.0* 30.5* 29.5* 30.6* 29.7*  MCV 78.9 79.3  --  82.9 83.4 80.7  PLT 230 234  --  208 184 676   Basic Metabolic Panel:  Recent Labs Lab 01/06/17 0522 01/07/17 0152 01/09/17 0355 01/10/17 0348 01/11/17 0317  NA 140 142 142 144 142  K 3.6 3.9 4.3 4.4 4.9  CL 98* 103 104 106 106  CO2 30 31 29  34* 30  GLUCOSE 155* 96 105* 123* 124*  BUN 45* 47* 33* 39* 40*  CREATININE 1.52* 1.48* 1.51* 1.65* 1.57*  CALCIUM 9.0 8.6* 8.7* 8.8* 8.6*  MG  --  2.0  --   --   --    GFR: Estimated Creatinine Clearance: 50.9 mL/min (A) (by C-G formula based on SCr of 1.57 mg/dL (H)). Liver Function Tests:  Recent Labs Lab 01/06/17 0522 01/07/17 0152  AST 21 15  ALT 9* 8*  ALKPHOS 61 56  BILITOT 0.4 0.9  PROT 6.2* 6.2*  ALBUMIN 2.1* 2.2*   No results for input(s): LIPASE, AMYLASE in the last 168 hours. No results for input(s): AMMONIA in the last 168 hours. Coagulation Profile:  Recent Labs Lab 01/07/17 0152 01/08/17 0310 01/09/17 0355 01/10/17 0348 01/11/17 0317  INR 1.44 1.34 1.51 1.67 1.73   Cardiac Enzymes: No results for input(s): CKTOTAL, CKMB, CKMBINDEX, TROPONINI in the last 168 hours. BNP (last 3 results) No results for input(s): PROBNP in the last 8760 hours. HbA1C: No results for input(s): HGBA1C in the last 72 hours. CBG: No results for input(s): GLUCAP in the last 168 hours. Lipid Profile: No results for input(s): CHOL, HDL, LDLCALC, TRIG, CHOLHDL, LDLDIRECT in the last 72 hours. Thyroid Function Tests: No results for input(s): TSH, T4TOTAL, FREET4, T3FREE, THYROIDAB in the last 72 hours. Anemia Panel: No results for input(s): VITAMINB12, FOLATE, FERRITIN, TIBC, IRON, RETICCTPCT in the last 72 hours. Urine analysis:    Component Value Date/Time   COLORURINE AMBER (A) 01/11/2015 0659   APPEARANCEUR CLOUDY (A) 01/11/2015 0659   LABSPEC 1.023 01/11/2015 0659   PHURINE 5.0 01/11/2015 0659   GLUCOSEU NEGATIVE 01/11/2015 0659   HGBUR MODERATE  (A) 01/11/2015 0659   BILIRUBINUR SMALL (A) 01/11/2015 0659   KETONESUR NEGATIVE 01/11/2015 0659   PROTEINUR 30 (A) 01/11/2015 0659   UROBILINOGEN 0.2 01/11/2015 0659   NITRITE NEGATIVE 01/11/2015 0659   LEUKOCYTESUR MODERATE (A) 01/11/2015 0659   Sepsis Labs: @LABRCNTIP (procalcitonin:4,lacticidven:4)   Recent Results (from the past 240 hour(s))  MRSA PCR Screening     Status: None   Collection Time:  01/06/17  9:27 AM  Result Value Ref Range Status   MRSA by PCR NEGATIVE NEGATIVE Final  Culture, respiratory (NON-Expectorated)     Status: None (Preliminary result)   Collection Time: 01/10/17  5:30 PM  Result Value Ref Range Status   Specimen Description BRONCHIAL ALVEOLAR LAVAGE RIGHT  Final   Special Requests NONE  Final   Gram Stain   Final    MODERATE WBC PRESENT, PREDOMINANTLY PMN NO ORGANISMS SEEN Performed at [facility], [place] [address], [place], [place] [address]    Culture PENDING  Incomplete   Report Status PENDING  Incomplete      Radiology Studies: Dg Chest Port 1 View  Result Date: 01/10/2017 CLINICAL DATA:  Atelectasis.  Shortness of breath EXAM: PORTABLE CHEST 1 VIEW COMPARISON:  Study obtained earlier in the day and January 09, 2017 FINDINGS: There is stable consolidation in the right upper lobe and right base regions. There is a small right pleural effusion. There is also patchy atelectasis in the left base. No new opacity compared earlier in the day. Heart is mildly enlarged with pulmonary vascularity within normal limits. No evident adenopathy. There is arthropathy in each shoulder. IMPRESSION: Areas of consolidation and volume loss on the right. Small right pleural effusion. Patchy atelectasis left base. No new opacity compared to earlier in the day. Stable cardiac silhouette. Electronically Signed   By: Lowella Grip III M.D.   On: 01/10/2017 17:51   Dg Chest Port 1 View  Result Date: 01/10/2017 CLINICAL DATA:  Atelectasis.  Shortness breath. EXAM:  PORTABLE CHEST 1 VIEW COMPARISON:  01/09/2017 FINDINGS: The left costophrenic angle is not included. Technologist note indicates: "ordering MD did not needed the image to be repeated to include costophrenic angles." There is significantly improved aeration in the right hemithorax. Continued volume loss on the right with shift of cardiac and mediastinal structures to the right. This is difficult to quantify given the rightward rotation of the chest during imaging. Continued obscuration of the right hemidiaphragm possibly from layering effusion, atelectasis, or pneumonia. Poor definition of the right mediastinal margin. Vague densities at the left lung base. Indistinct pulmonary vasculature. Potential enlargement of the cardiopericardial silhouette. Considerable degenerative arthropathy of the right glenohumeral joint. Reduced acromiohumeral distance on the right suspicious for chronic rotator cuff tear. IMPRESSION: 1. Interval improved aeration in the right hemithorax, but with continued considerable airspace opacity especially at the right lung base and adjacent to the right mediastinum. Cannot exclude layering right pleural effusion. 2. Indistinct airspace opacity in left lower lobe, potentially from edema or pneumonia. 3. Suspected cardiomegaly. Indistinct pulmonary vasculature suggesting pulmonary venous hypertension and potentially mild interstitial edema. 4. Chronic right rotator cuff tear with advanced degenerative glenohumeral arthropathy on the right. Electronically Signed   By: Van Clines M.D.   On: 01/10/2017 17:20   Dg Chest Port 1 View  Result Date: 01/09/2017 CLINICAL DATA:  Worsening shortness of breath. Question aspiration pneumonia. EXAM: PORTABLE CHEST 1 VIEW COMPARISON:  January 06, 2017 FINDINGS: There is complete opacification of the right hemithorax which is worsened in the interval. There is volume loss on the right with shift of the heart mediastinum. Diffuse interstitial opacities in  the left lung have worsened. No other interval changes. No pneumothorax. IMPRESSION: 1. Complete opacification of the right hemithorax has worsened in the interval. Recommend clinical correlation and follow-up to resolution. 2. Diffuse interstitial opacities in the left lung are worse in the interval most consistent with edema. Electronically Signed  By: Dorise Bullion III M.D   On: 01/09/2017 09:37        Scheduled Meds: . acetylcysteine  2 mL Nebulization BID  . arformoterol  15 mcg Nebulization BID  . budesonide  0.5 mg Nebulization BID  . fentaNYL (SUBLIMAZE) injection  100 mcg Intravenous Once  . folic acid  1 mg Oral Daily  . ipratropium-albuterol  3 mL Nebulization QID  . lidocaine  1 application Other Once  . midazolam  2 mg Intravenous Once  . pantoprazole (PROTONIX) IV  40 mg Intravenous Q12H  . sertraline  100 mg Oral QHS  . sodium chloride flush  3 mL Intravenous Q12H   Continuous Infusions: . ampicillin-sulbactam (UNASYN) IV Stopped (01/11/17 0230)     LOS: 5 days    Time spent: 25 minutes  Greater than 50% of the time spent on counseling and coordinating the care.   Leisa Lenz, MD [facility] Pager [phone]  If 7PM-7AM, please contact night-coverage [email or web address] Password [email or web address] 01/11/2017, 9:45 AM

## 2017-01-11 NOTE — Progress Notes (Signed)
Progress Note   Subjective  Chief Complaint: Coffee ground emesis, anemia  Pt doing well this morning, seems to have improved over the weekend, appearing more alert and active. Per his wife in the room he was switched to Nasal cannula yesterday and has been doing better with breathing since his bronchoscopy yesterday. Patient is eager to either get this procedure over with or get something to eat.    Objective   Vital signs in last 24 hours: Temp:  [97 F (36.1 C)-97.9 F (36.6 C)] 97 F (36.1 C) (07/08 2323) Pulse Rate:  [40-87] 69 (07/09 0600) Resp:  [19-27] 24 (07/09 0600) BP: (114-161)/(61-88) 153/77 (07/09 0600) SpO2:  [88 %-100 %] 96 % (07/09 0749) FiO2 (%):  [36 %-40 %] 40 % (07/09 0400) Weight:  [263 lb 10.7 oz (119.6 kg)] 263 lb 10.7 oz (119.6 kg) (07/09 0500) Last BM Date: 01/05/17 General:    Caucasian male in NAD Heart:  Regular rate and rhythm; no murmurs Lungs: Respirations even and unlabored,coarse breath sounds, O2 via [place] Abdomen:  Soft, nontender and nondistended. Normal bowel sounds. Extremities:  Without edema. Neurologic:  Alert and oriented,  grossly normal neurologically. Psych:  Cooperative. Normal mood and affect.  Intake/Output from previous day: 07/08 0701 - 07/09 0700 In: 800 [P.O.:500; IV Piggyback:300] Out: 1010 [Urine:1010]  Lab Results:  Recent Labs  01/09/17 0355 01/10/17 0348 01/11/17 0317  WBC 7.8 8.1 5.4  HGB 8.6* 8.8* 8.6*  HCT 29.5* 30.6* 29.7*  PLT 208 184 190   BMET  Recent Labs  01/09/17 0355 01/10/17 0348 01/11/17 0317  NA 142 144 142  K 4.3 4.4 4.9  CL 104 106 106  CO2 29 34* 30  GLUCOSE 105* 123* 124*  BUN 33* 39* 40*  CREATININE 1.51* 1.65* 1.57*  CALCIUM 8.7* 8.8* 8.6*   LFT No results for input(s): PROT, ALBUMIN, AST, ALT, ALKPHOS, BILITOT, BILIDIR, IBILI in the last 72 hours. PT/INR  Recent Labs  01/10/17 0348 01/11/17 0317  LABPROT 19.9* 20.4*  INR 1.67 1.73    Studies/Results: Dg Chest  Port 1 View  Result Date: 01/10/2017 CLINICAL DATA:  Atelectasis.  Shortness of breath EXAM: PORTABLE CHEST 1 VIEW COMPARISON:  Study obtained earlier in the day and January 09, 2017 FINDINGS: There is stable consolidation in the right upper lobe and right base regions. There is a small right pleural effusion. There is also patchy atelectasis in the left base. No new opacity compared earlier in the day. Heart is mildly enlarged with pulmonary vascularity within normal limits. No evident adenopathy. There is arthropathy in each shoulder. IMPRESSION: Areas of consolidation and volume loss on the right. Small right pleural effusion. Patchy atelectasis left base. No new opacity compared to earlier in the day. Stable cardiac silhouette. Electronically Signed   By: Lowella Grip III M.D.   On: 01/10/2017 17:51   Dg Chest Port 1 View  Result Date: 01/10/2017 CLINICAL DATA:  Atelectasis.  Shortness breath. EXAM: PORTABLE CHEST 1 VIEW COMPARISON:  01/09/2017 FINDINGS: The left costophrenic angle is not included. Technologist note indicates: "ordering MD did not needed the image to be repeated to include costophrenic angles." There is significantly improved aeration in the right hemithorax. Continued volume loss on the right with shift of cardiac and mediastinal structures to the right. This is difficult to quantify given the rightward rotation of the chest during imaging. Continued obscuration of the right hemidiaphragm possibly from layering effusion, atelectasis, or pneumonia. Poor definition of the right  mediastinal margin. Vague densities at the left lung base. Indistinct pulmonary vasculature. Potential enlargement of the cardiopericardial silhouette. Considerable degenerative arthropathy of the right glenohumeral joint. Reduced acromiohumeral distance on the right suspicious for chronic rotator cuff tear. IMPRESSION: 1. Interval improved aeration in the right hemithorax, but with continued considerable airspace  opacity especially at the right lung base and adjacent to the right mediastinum. Cannot exclude layering right pleural effusion. 2. Indistinct airspace opacity in left lower lobe, potentially from edema or pneumonia. 3. Suspected cardiomegaly. Indistinct pulmonary vasculature suggesting pulmonary venous hypertension and potentially mild interstitial edema. 4. Chronic right rotator cuff tear with advanced degenerative glenohumeral arthropathy on the right. Electronically Signed   By: Van Clines M.D.   On: 01/10/2017 17:20   Dg Chest Port 1 View  Result Date: 01/09/2017 CLINICAL DATA:  Worsening shortness of breath. Question aspiration pneumonia. EXAM: PORTABLE CHEST 1 VIEW COMPARISON:  January 06, 2017 FINDINGS: There is complete opacification of the right hemithorax which is worsened in the interval. There is volume loss on the right with shift of the heart mediastinum. Diffuse interstitial opacities in the left lung have worsened. No other interval changes. No pneumothorax. IMPRESSION: 1. Complete opacification of the right hemithorax has worsened in the interval. Recommend clinical correlation and follow-up to resolution. 2. Diffuse interstitial opacities in the left lung are worse in the interval most consistent with edema. Electronically Signed   By: Dorise Bullion III M.D   On: 01/09/2017 09:37     Assessment / Plan:    Assessment: 1. Upper GI Bleeding: h/o [place] emesis prior to admission, total 4 u prbc and 2 u ffp since admission, hgb stable in 8's, INR now 1.73, patient was requiring venti mask prior to this weekend, since yesterday and bronchoscopy, now on [place] 2L-will likely attempt EGD today; Consider gastritis vs pud vs other 2. Anemia: with above  Plan: 1. Continue BID PPI. 2. Spoke with anesthesia, they will review patient case and see today to discern whether they are ok with proceeding with EGD. 3. Tentatively scheduled EGD for 11:00 with Dr. Hilarie Fredrickson 4. Please await any  further recommendations after time of procedure  Thank you for your kind consultation, we will continue to follow  LOS: 5 days   Gary Boyer  01/11/2017, 9:02 AM  Pager # [phone]

## 2017-01-12 ENCOUNTER — Inpatient Hospital Stay (HOSPITAL_COMMUNITY): Payer: Medicare Other

## 2017-01-12 DIAGNOSIS — J690 Pneumonitis due to inhalation of food and vomit: Secondary | ICD-10-CM

## 2017-01-12 LAB — BASIC METABOLIC PANEL
ANION GAP: 7 (ref 5–15)
BUN: 37 mg/dL — AB (ref 6–20)
CO2: 32 mmol/L (ref 22–32)
Calcium: 8.6 mg/dL — ABNORMAL LOW (ref 8.9–10.3)
Chloride: 104 mmol/L (ref 101–111)
Creatinine, Ser: 1.37 mg/dL — ABNORMAL HIGH (ref 0.61–1.24)
GFR, EST AFRICAN AMERICAN: 55 mL/min — AB (ref >60)
GFR, EST NON AFRICAN AMERICAN: 47 mL/min — AB (ref >60)
Glucose, Bld: 95 mg/dL (ref 65–99)
POTASSIUM: 4.5 mmol/L (ref 3.5–5.1)
SODIUM: 143 mmol/L (ref 135–145)

## 2017-01-12 LAB — CBC
HCT: 29.1 % — ABNORMAL LOW (ref 39.0–52.0)
Hemoglobin: 8.7 g/dL — ABNORMAL LOW (ref 13.0–17.0)
MCH: 24.6 pg — AB (ref 26.0–34.0)
MCHC: 29.9 g/dL — ABNORMAL LOW (ref 30.0–36.0)
MCV: 82.2 fL (ref 78.0–100.0)
PLATELETS: 194 10*3/uL (ref 150–400)
RBC: 3.54 MIL/uL — AB (ref 4.22–5.81)
RDW: 19.9 % — ABNORMAL HIGH (ref 11.5–15.5)
WBC: 6 10*3/uL (ref 4.0–10.5)

## 2017-01-12 LAB — PROTIME-INR
INR: 1.77
PROTHROMBIN TIME: 20.9 s — AB (ref 11.4–15.2)

## 2017-01-12 MED ORDER — ZOLPIDEM TARTRATE 5 MG PO TABS
5.0000 mg | ORAL_TABLET | Freq: Every evening | ORAL | Status: DC | PRN
  Administered 2017-01-12: 5 mg via ORAL
  Filled 2017-01-12: qty 1

## 2017-01-12 NOTE — Clinical Social Work Note (Signed)
Clinical Social Work Assessment  Patient Details  Name: Gary Boyer MRN: [id number] Date of Birth: 1936/02/27  Date of referral:  01/12/17               Reason for consult:  Discharge Planning, Facility Placement                Permission sought to share information with:  Chartered certified accountant granted to share information::  Yes, Verbal Permission Granted  Name::        Agency::     Relationship::     Contact Information:     Housing/Transportation Living arrangements for the past 2 months:  Single Family Home Source of Information:  Spouse, Patient Patient Interpreter Needed:  None Criminal Activity/Legal Involvement Pertinent to Current Situation/Hospitalization:  No - Comment as needed Significant Relationships:  Spouse Lives with:  Spouse Do you feel safe going back to the place where you live?   (SNF recommended.) Need for family participation in patient care:  Yes (Comment)  Care giving concerns:  Pt requires more assistance than available at home.   Social Worker assessment / plan: Pt hospitalized from home on 01/06/17 with acute upper GI bleed. CSW consulted to assist with d/c planning. PT has recommended SNF at d/c. CSW met with pt / spouse at bedside to assist with d/c planning. Pt / spouse agree with plan for SNF at d/c. SNF search initiated and [facility] contacted at pt / spouse request. CSW will provide bed offers once available. CSW will continue to follow to assist with d/c planning.  Employment status:  Retired Forensic scientist:  Medicare PT Recommendations:  [place] / Referral to community resources:  [place]  Patient/Family's Response to care: Pt / spouse feel ST Rehab is needed.  Patient/Family's Understanding of and Emotional Response to Diagnosis, Current Treatment, and Prognosis:  Pt reports that he has felt better. Spouse reports that pt had a good experience at  [facility] [place] 2 yrs ago. " We are hoping that [facility] will have an opening. My husband received really good care at that facility." Support / reassurance provided.  Emotional Assessment Appearance:  Appears stated age Attitude/Demeanor/Rapport:   (cooperative) Affect (typically observed):  Appropriate, Calm Orientation:  Oriented to Self, Oriented to Place, Oriented to  Time, Oriented to Situation Alcohol / Substance use:  Not Applicable Psych involvement (Current and /or in the community):  No (Comment)  Discharge Needs  Concerns to be addressed:  Discharge Planning Concerns Readmission within the last 30 days:  No Current discharge risk:  None Barriers to Discharge:  No Barriers Identified   Gary Boyer  [phone] 01/12/2017, 2:39 PM

## 2017-01-12 NOTE — Progress Notes (Signed)
[facility] Progress Note  Subjective:  Feels ok.  No further sign of GI bleeding.  Hgb stable at 8.7 grams.    EGD 7/9 as follows:  - LA Grade D erosive esophagitis in the distal esophagus (unclear if this was present before or after recent critical illness). - Large hiatal hernia. - Three angiodysplastic lesions in the stomach, 1 actively bleeding. Injected. Treated with argon plasma coagulation (APC). Clips (MR conditional) were placed. These lesions certainly could explain acute blood loss and hematemesis, especially in the setting of warfarin therapy and psychotherapeutic INR - Duodenal mucosal lymphangiectasia (benign). - Examined duodenum otherwise normal.  Objective:  Vital signs in last 24 hours: Temp:  [97.4 F (36.3 C)-97.9 F (36.6 C)] 97.4 F (36.3 C) (07/10 0800) Pulse Rate:  [47-98] 56 (07/10 0745) Resp:  [17-28] 24 (07/10 0745) BP: (105-147)/(55-81) 132/80 (07/10 0745) SpO2:  [87 %-99 %] 97 % (07/10 0800) Last BM Date: 01/05/17 General:  Alert, Well-developed, in NAD Heart:  Regular rate and rhythm; no murmurs Pulm:  Course BS noted.   Abdomen:  Soft, non-distended.  Normal bowel sounds.  Non-tender. Extremities:  Without edema. Neurologic:  Alert and oriented x 4;  grossly normal neurologically. Psych:  Alert and cooperative. Normal mood and affect.  Intake/Output from previous day: 07/09 0701 - 07/10 0700 In: 920 [P.O.:720; I.V.:100; IV Piggyback:100] Out: 950 [Urine:950]  Lab Results:  Recent Labs  01/10/17 0348 01/11/17 0317 01/12/17 0328  WBC 8.1 5.4 6.0  HGB 8.8* 8.6* 8.7*  HCT 30.6* 29.7* 29.1*  PLT 184 190 194   BMET  Recent Labs  01/10/17 0348 01/11/17 0317 01/12/17 0328  NA 144 142 143  K 4.4 4.9 4.5  CL 106 106 104  CO2 34* 30 32  GLUCOSE 123* 124* 95  BUN 39* 40* 37*  CREATININE 1.65* 1.57* 1.37*  CALCIUM 8.8* 8.6* 8.6*   PT/INR  Recent Labs  01/11/17 0317 01/12/17 0328  LABPROT 20.4* 20.9*    INR 1.73 1.77   Dg Chest Port 1 View  Result Date: 01/12/2017 CLINICAL DATA:  Respiratory failure EXAM: PORTABLE CHEST 1 VIEW COMPARISON:  January 10, 2017 FINDINGS: There is a persistent right pleural effusion with consolidation in the right upper lobe and right base regions. There is mild left base atelectasis. Heart is mildly enlarged with pulmonary vascularity within normal limits. There is aortic atherosclerosis. No adenopathy. There is arthropathy in each shoulder. IMPRESSION: Persistent right pleural effusion with areas of consolidation on the right, unchanged. There is stable left base atelectasis. Overall no new opacity. Stable cardiac prominence. There is aortic atherosclerosis. Aortic Atherosclerosis (ICD10-I70.0). Electronically Signed   By: Lowella Grip III M.D.   On: 01/12/2017 07:09   Dg Chest Port 1 View  Result Date: 01/10/2017 CLINICAL DATA:  Atelectasis.  Shortness of breath EXAM: PORTABLE CHEST 1 VIEW COMPARISON:  Study obtained earlier in the day and January 09, 2017 FINDINGS: There is stable consolidation in the right upper lobe and right base regions. There is a small right pleural effusion. There is also patchy atelectasis in the left base. No new opacity compared earlier in the day. Heart is mildly enlarged with pulmonary vascularity within normal limits. No evident adenopathy. There is arthropathy in each shoulder. IMPRESSION: Areas of consolidation and volume loss on the right. Small right pleural effusion. Patchy atelectasis left base. No new opacity compared to earlier in the day. Stable cardiac silhouette. Electronically Signed   By: Lowella Grip III M.D.  On: 01/10/2017 17:51   Dg Chest Port 1 View  Result Date: 01/10/2017 CLINICAL DATA:  Atelectasis.  Shortness breath. EXAM: PORTABLE CHEST 1 VIEW COMPARISON:  01/09/2017 FINDINGS: The left costophrenic angle is not included. Technologist note indicates: "ordering MD did not needed the image to be repeated to include  costophrenic angles." There is significantly improved aeration in the right hemithorax. Continued volume loss on the right with shift of cardiac and mediastinal structures to the right. This is difficult to quantify given the rightward rotation of the chest during imaging. Continued obscuration of the right hemidiaphragm possibly from layering effusion, atelectasis, or pneumonia. Poor definition of the right mediastinal margin. Vague densities at the left lung base. Indistinct pulmonary vasculature. Potential enlargement of the cardiopericardial silhouette. Considerable degenerative arthropathy of the right glenohumeral joint. Reduced acromiohumeral distance on the right suspicious for chronic rotator cuff tear. IMPRESSION: 1. Interval improved aeration in the right hemithorax, but with continued considerable airspace opacity especially at the right lung base and adjacent to the right mediastinum. Cannot exclude layering right pleural effusion. 2. Indistinct airspace opacity in left lower lobe, potentially from edema or pneumonia. 3. Suspected cardiomegaly. Indistinct pulmonary vasculature suggesting pulmonary venous hypertension and potentially mild interstitial edema. 4. Chronic right rotator cuff tear with advanced degenerative glenohumeral arthropathy on the right. Electronically Signed   By: Van Clines M.D.   On: 01/10/2017 17:20   Assessment / Plan: 1. Upper GI Bleeding: h/o Coffee ground emesis prior to admission.  This was likely secondary to the gastric AVM's that were injected/APC'ed/clipped on 7/9 EGD.  Also had Grade D esophagitis but that could be a result of the vomiting that he was having. 2. Anemia: Total 4 u prbc since admission, hgb stable in 8's. 3. Supratherapeutic INR on admission:  Now normalized after 2 units FFP.  As per Dr. Vena Rua recommendations after EGD on 7/9, would hold coumadin as long as possible, timing of restart is hard to be determined and if necessary to restart  then needs to be monitored closely to prevent supratherapeutic levels.   LOS: 6 days   Lenzy Kerschner D.  01/12/2017, 10:14 AM  Pager number [phone]

## 2017-01-12 NOTE — Progress Notes (Addendum)
Patient ID: Gary Boyer, male   DOB: 1936/02/27, 81 y.o.   MRN: [id number]  PROGRESS NOTE    Gary Boyer  YPP:[id number] DOB: [birth year] DOA: 01/06/2017  PCP: [name], MD   Brief Narrative:  81 year old male with history of a fib, PE, on coumadin, chronic CHF, COPD. Pt presented to [facility] ED with coffee-ground emesis started the night prior to the admission. Pt was recently treated with doxycyline and prednisone for UTI.  On admission, hgb was 7, pt was hypotensive and FOBT was positive. GI consulted. Patient has so far received 4 units of PRBC and 2 units of FFP transfusion. There is no overt GI bleed since admission. Patient has chronic breathing trouble and he supposed to be on home oxygen. He has required Ventimask and nasal cannula to keep oxygen saturation above 90%.  During this hospital stay pt had worsening CXR and he underwent bronchoscopy 7/8. He also underwent EGD 7/9 which showed esophagitis in the lower third of the esophagus and it had contact bleeding which stopped spontaneously. Pt was also found to have three 4-7 mm bleeding angiodysplastic lesions in the stomach, 1 actively bleeding.  Assessment & Plan:   Principal Problem: Acute upper GI bleed / Acute blood loss anemia - Hgb 6.9 on admission - FOBT was positive - S/P 4 U PRBC and 2 U FFP transfusion during this hospital stay - EGD 7/9 which showed esophagitis in the lower third of the esophagus and it had contact bleeding which stopped spontaneously. Pt was also found to have three 4-7 mm bleeding angiodysplastic lesions on the lesser curvature of the gastric body, one of them was actively bleeding. The largest lesion continue to ooze even after treatment and then 3 hemostatic clips were placed and bleeding stopped. - Continue PPI therapy - Per GI, difficult to say when [facility] can be resumed, hgb needs to be monitored closely  Active Problems:   Acute respiratory failure with hypoxia / Acute COPD exacerbation  - CXR  7/7 showed complete opacification of the right hemithorax, has worsened since prior study - Pt seen by pulmonary, s/p bronchoscopy 7/8 with some clearing up of the right heimthorax based on CXR 7/8 after bronch - Pt given lasix x once 7/8 20 mg IV - Stopped steroids 7/8 per pulmonary - CXR this am showed persistent right pleural effusion with areas of consolidation on the right, unchanged. There is stable left base atelectasis. Overall no new opacity. - Appreciate pulmonary following and their recommendations  - Pt on 12 L [place] oxygen support   Morbid obesity due to excess calories (HCC) - Diet as tolerated - Counseled on nutrition   Atrial fibrillation (HCC) - CHADS vasc score 4 - Given 10 mg IV vitamin K to reverse INR - AC on hold due to risk of bleeding   Chronic diastolic CHF - Compensated   Pulmonary embolism (HCC)  - AC On hold due to risk of bleed   CKD stage 4 - Cr in 2016 as high as 2.9 - Cr 1.27 this am, within baseline range   Aspiration pneumonia of right lower lobe (HCC) - Continue Unasyn   Protein calorie malnutrition, moderate - In the context of acute illness   Depression - Continue Zoloft    DVT prophylaxis: SCD Code Status: full code  Family Communication: no family at bedside Disposition Plan: keep in SDU since on high flow oxygen    Consultants:   GI, Dr. Owens Loffler  Pulmonary   Procedures:  Bronchoscopy 7/8   EGD 7/9 which showed esophagitis in the lower third of the esophagus and it had contact bleeding which stopped spontaneously. Pt was also found to have three 4-7 mm bleeding angiodysplastic lesions on the lesser curvature of the gastric body, one of them was actively bleeding. The largest lesion continue to ooze even after treatment and then 3 hemostatic clips were placed and bleeding stopped.  Antimicrobials:   Unasyn 7/4 -->   Subjective: No overnight events.  Objective: Vitals:   01/12/17 0600  01/12/17 0745 01/12/17 0800 01/12/17 1027  BP: (!) 143/66 132/80    Pulse: 74 (!) 56  62  Resp: (!) 22 (!) 24  19  Temp:   (!) 97.4 F (36.3 C)   TempSrc:   Oral   SpO2: 97% 93% 97% 99%  Weight:      Height:        Intake/Output Summary (Last 24 hours) at 01/12/17 1109 Last data filed at 01/12/17 0500  Gross per 24 hour  Intake              920 ml  Output              950 ml  Net              -30 ml   Filed Weights   01/09/17 0124 01/10/17 0500 01/11/17 0500  Weight: 118.8 kg (261 lb 14.5 oz) 118.5 kg (261 lb 3.9 oz) 119.6 kg (263 lb 10.7 oz)    Examination:  Physical Exam  Constitutional: Appears well-developed and well-nourished. No distress.  CVS: Rate controlled, S1/S2 +, Pulmonary: diminished breath sounds, coarse breath sounds  Abdominal: Soft. BS +,  no distension, tenderness, rebound or guarding.  Musculoskeletal: Normal range of motion. No edema and no tenderness.  Neuro: Alert. Normal reflexes, muscle tone coordination. No cranial nerve deficit. Skin: Skin is warm and dry.  Psychiatric: Normal mood and affect.     Data Reviewed: I have personally reviewed following labs and imaging studies  CBC:  Recent Labs Lab 01/07/17 0758 01/07/17 1757 01/09/17 0355 01/10/17 0348 01/11/17 0317 01/12/17 0328  WBC 7.8  --  7.8 8.1 5.4 6.0  HGB 7.8* 9.2* 8.6* 8.8* 8.6* 8.7*  HCT 26.0* 30.5* 29.5* 30.6* 29.7* 29.1*  MCV 79.3  --  82.9 83.4 80.7 82.2  PLT 234  --  208 184 190 371   Basic Metabolic Panel:  Recent Labs Lab 01/07/17 0152 01/09/17 0355 01/10/17 0348 01/11/17 0317 01/12/17 0328  NA 142 142 144 142 143  K 3.9 4.3 4.4 4.9 4.5  CL 103 104 106 106 104  CO2 31 29 34* 30 32  GLUCOSE 96 105* 123* 124* 95  BUN 47* 33* 39* 40* 37*  CREATININE 1.48* 1.51* 1.65* 1.57* 1.37*  CALCIUM 8.6* 8.7* 8.8* 8.6* 8.6*  MG 2.0  --   --   --   --    GFR: Estimated Creatinine Clearance: 58.4 mL/min (A) (by C-G formula based on SCr of 1.37 mg/dL (H)). Liver  Function Tests:  Recent Labs Lab 01/06/17 0522 01/07/17 0152  AST 21 15  ALT 9* 8*  ALKPHOS 61 56  BILITOT 0.4 0.9  PROT 6.2* 6.2*  ALBUMIN 2.1* 2.2*   No results for input(s): LIPASE, AMYLASE in the last 168 hours. No results for input(s): AMMONIA in the last 168 hours. Coagulation Profile:  Recent Labs Lab 01/08/17 0310 01/09/17 0355 01/10/17 0348 01/11/17 0317 01/12/17 0328  INR 1.34 1.51 1.67  1.73 1.77   Cardiac Enzymes: No results for input(s): CKTOTAL, CKMB, CKMBINDEX, TROPONINI in the last 168 hours. BNP (last 3 results) No results for input(s): PROBNP in the last 8760 hours. HbA1C: No results for input(s): HGBA1C in the last 72 hours. CBG: No results for input(s): GLUCAP in the last 168 hours. Lipid Profile: No results for input(s): CHOL, HDL, LDLCALC, TRIG, CHOLHDL, LDLDIRECT in the last 72 hours. Thyroid Function Tests: No results for input(s): TSH, T4TOTAL, FREET4, T3FREE, THYROIDAB in the last 72 hours. Anemia Panel: No results for input(s): VITAMINB12, FOLATE, FERRITIN, TIBC, IRON, RETICCTPCT in the last 72 hours. Urine analysis:    Component Value Date/Time   COLORURINE AMBER (A) 01/11/2015 0659   APPEARANCEUR CLOUDY (A) 01/11/2015 0659   LABSPEC 1.023 01/11/2015 0659   PHURINE 5.0 01/11/2015 0659   GLUCOSEU NEGATIVE 01/11/2015 0659   HGBUR MODERATE (A) 01/11/2015 0659   BILIRUBINUR SMALL (A) 01/11/2015 0659   KETONESUR NEGATIVE 01/11/2015 0659   PROTEINUR 30 (A) 01/11/2015 0659   UROBILINOGEN 0.2 01/11/2015 0659   NITRITE NEGATIVE 01/11/2015 0659   LEUKOCYTESUR MODERATE (A) 01/11/2015 0659   Sepsis Labs: @LABRCNTIP (procalcitonin:4,lacticidven:4)   Recent Results (from the past 240 hour(s))  MRSA PCR Screening     Status: None   Collection Time: 01/06/17  9:27 AM  Result Value Ref Range Status   MRSA by PCR NEGATIVE NEGATIVE Final  Culture, respiratory (NON-Expectorated)     Status: None (Preliminary result)   Collection Time: 01/10/17   5:30 PM  Result Value Ref Range Status   Specimen Description BRONCHIAL ALVEOLAR LAVAGE RIGHT  Final   Special Requests NONE  Final   Gram Stain   Final    MODERATE WBC PRESENT, PREDOMINANTLY PMN NO ORGANISMS SEEN Performed at [facility], [address] [place], [place] [address]    Culture PENDING  Incomplete   Report Status PENDING  Incomplete      Radiology Studies: Dg Chest Port 1 View  Result Date: 01/12/2017 CLINICAL DATA:  Respiratory failure EXAM: PORTABLE CHEST 1 VIEW COMPARISON:  January 10, 2017 FINDINGS: There is a persistent right pleural effusion with consolidation in the right upper lobe and right base regions. There is mild left base atelectasis. Heart is mildly enlarged with pulmonary vascularity within normal limits. There is aortic atherosclerosis. No adenopathy. There is arthropathy in each shoulder. IMPRESSION: Persistent right pleural effusion with areas of consolidation on the right, unchanged. There is stable left base atelectasis. Overall no new opacity. Stable cardiac prominence. There is aortic atherosclerosis. Aortic Atherosclerosis (ICD10-I70.0). Electronically Signed   By: Lowella Grip III M.D.   On: 01/12/2017 07:09   Dg Chest Port 1 View  Result Date: 01/10/2017 CLINICAL DATA:  Atelectasis.  Shortness of breath EXAM: PORTABLE CHEST 1 VIEW COMPARISON:  Study obtained earlier in the day and January 09, 2017 FINDINGS: There is stable consolidation in the right upper lobe and right base regions. There is a small right pleural effusion. There is also patchy atelectasis in the left base. No new opacity compared earlier in the day. Heart is mildly enlarged with pulmonary vascularity within normal limits. No evident adenopathy. There is arthropathy in each shoulder. IMPRESSION: Areas of consolidation and volume loss on the right. Small right pleural effusion. Patchy atelectasis left base. No new opacity compared to earlier in the day. Stable cardiac silhouette.  Electronically Signed   By: Lowella Grip III M.D.   On: 01/10/2017 17:51   Dg Chest [facility]  Result Date: 01/10/2017 CLINICAL DATA:  Atelectasis.  Shortness breath. EXAM: PORTABLE CHEST 1 VIEW COMPARISON:  01/09/2017 FINDINGS: The left costophrenic angle is not included. Technologist note indicates: "ordering MD did not needed the image to be repeated to include costophrenic angles." There is significantly improved aeration in the right hemithorax. Continued volume loss on the right with shift of cardiac and mediastinal structures to the right. This is difficult to quantify given the rightward rotation of the chest during imaging. Continued obscuration of the right hemidiaphragm possibly from layering effusion, atelectasis, or pneumonia. Poor definition of the right mediastinal margin. Vague densities at the left lung base. Indistinct pulmonary vasculature. Potential enlargement of the cardiopericardial silhouette. Considerable degenerative arthropathy of the right glenohumeral joint. Reduced acromiohumeral distance on the right suspicious for chronic rotator cuff tear. IMPRESSION: 1. Interval improved aeration in the right hemithorax, but with continued considerable airspace opacity especially at the right lung base and adjacent to the right mediastinum. Cannot exclude layering right pleural effusion. 2. Indistinct airspace opacity in left lower lobe, potentially from edema or pneumonia. 3. Suspected cardiomegaly. Indistinct pulmonary vasculature suggesting pulmonary venous hypertension and potentially mild interstitial edema. 4. Chronic right rotator cuff tear with advanced degenerative glenohumeral arthropathy on the right. Electronically Signed   By: Van Clines M.D.   On: 01/10/2017 17:20   Dg Chest Port 1 View  Result Date: 01/09/2017 CLINICAL DATA:  Worsening shortness of breath. Question aspiration pneumonia. EXAM: PORTABLE CHEST 1 VIEW COMPARISON:  January 06, 2017 FINDINGS: There is  complete opacification of the right hemithorax which is worsened in the interval. There is volume loss on the right with shift of the heart mediastinum. Diffuse interstitial opacities in the left lung have worsened. No other interval changes. No pneumothorax. IMPRESSION: 1. Complete opacification of the right hemithorax has worsened in the interval. Recommend clinical correlation and follow-up to resolution. 2. Diffuse interstitial opacities in the left lung are worse in the interval most consistent with edema. Electronically Signed   By: Dorise Bullion III M.D   On: 01/09/2017 09:37        Scheduled Meds: . acetylcysteine  2 mL Nebulization BID  . arformoterol  15 mcg Nebulization BID  . budesonide  0.5 mg Nebulization BID  . fentaNYL (SUBLIMAZE) injection  100 mcg Intravenous Once  . folic acid  1 mg Oral Daily  . ipratropium-albuterol  3 mL Nebulization QID  . lidocaine  1 application Other Once  . midazolam  2 mg Intravenous Once  . pantoprazole  40 mg Oral BID  . sertraline  100 mg Oral QHS  . sodium chloride flush  3 mL Intravenous Q12H   Continuous Infusions: . ampicillin-sulbactam (UNASYN) IV Stopped (01/12/17 0822)     LOS: 6 days    Time spent: 25 minutes  Greater than 50% of the time spent on counseling and coordinating the care.   Leisa Lenz, MD [facility] Pager [phone]  If 7PM-7AM, please contact night-coverage [email or web address] Password [email or web address] 01/12/2017, 11:09 AM

## 2017-01-12 NOTE — Progress Notes (Signed)
RT attempted to place patient on BIPAP. Patients sats went down to 80%. RN aware. Patient is now on a 15 liter salter.

## 2017-01-12 NOTE — Progress Notes (Signed)
Attempted to turn pt on side, sats dropped to 79%.  Increased [place] to 6 liters, turned back supine, HOB increased, placed back on High Flow o2 at 10 liters before sats back to 95%.  Pt in no acute distress during episode.  Now weaning high flow at tol.

## 2017-01-12 NOTE — NC FL2 (Signed)
[place] LEVEL OF CARE SCREENING TOOL     IDENTIFICATION  Patient Name: Gary Boyer Birthdate: Mar 31, 1936 Sex: male Admission Date (Current Location): 01/06/2017  [facility] and [place] Number:  Herbalist and Address:  [facility],  [address], [place]      Provider Number: [id number]  Attending Physician Name and Address:  Robbie Lis, MD  Relative Name and Phone Number:       Current Level of Care: Hospital Recommended Level of Care: [place] Prior Approval Number:    Date Approved/Denied:   PASRR Number: [id number]  Discharge Plan: SNF    Current Diagnoses: Patient Active Problem List   Diagnosis Date Noted  . Acute esophagitis   . Gastric hemorrhage due to angiodysplasia of stomach   . Atelectasis 01/10/2017  . Acute upper GI bleed 01/06/2017  . Anemia associated with acute blood loss 01/06/2017  . Supratherapeutic INR 01/06/2017  . Aspiration pneumonia of right lower lobe ([place]) 01/06/2017  . CKD (chronic kidney disease) stage 3, GFR 30-59 ml/min 01/06/2017  . Protein calorie malnutrition ([place]) 01/06/2017  . Hypotension 01/06/2017  . Chronic diastolic CHF (congestive heart failure) ([place]) 01/06/2017  . COPD (chronic obstructive pulmonary disease) ([place])   . Chronic anticoagulation 01/12/2015  . Shock circulatory ([place]) 01/12/2015  . Acute respiratory failure with hypoxia ([place]) 01/12/2015  . COPD exacerbation ([place]) 01/11/2015  . Acute and chronic respiratory failure with hypoxia ([place]) 01/11/2015  . Acute-on-chronic kidney injury ([place]) 01/11/2015  . Atrial fibrillation ([place]) 01/11/2015  . Pulmonary embolism ([place]) 01/11/2015  . Incisional hernia with gangrene and obstruction s/p exlap/Sb resection & repair 01/11/2015 01/11/2015  . Depression 04/20/2013  . Long term current use of anticoagulant 03/17/2013  . Edema 12/20/2012  . Compulsive tobacco user syndrome 12/15/2012  . Essential  hypertension, benign 12/08/2012  . Chronic airway obstruction, not elsewhere classified 12/08/2012  . Unspecified arthropathy, lower leg 12/08/2012  . Benign essential HTN 12/08/2012  . Expected blood loss anemia 11/09/2012  . Morbid obesity ([place]) 11/09/2012  . S/P left TKA 11/08/2012  . Acid reflux 10/26/2012  . Avitaminosis D 01/29/2011    Orientation RESPIRATION BLADDER Height & Weight     Self, Time, Situation, Place  O2, Other (Comment) (CPAP at night) Incontinent Weight: 263 lb 10.7 oz (119.6 kg) Height:  6' (182.9 cm)  BEHAVIORAL SYMPTOMS/MOOD NEUROLOGICAL BOWEL NUTRITION STATUS  Other (Comment) (no behaviors)   Incontinent Diet  AMBULATORY STATUS COMMUNICATION OF NEEDS Skin   Extensive Assist Verbally Other (Comment) (Abrasions on right thigh and left wrist.)                       Personal Care Assistance Level of Assistance  Bathing, Feeding, Dressing Bathing Assistance: Maximum assistance Feeding assistance: Independent Dressing Assistance: Maximum assistance     Functional Limitations Info  Sight, Hearing, Speech Sight Info: Impaired Hearing Info: Adequate Speech Info: Adequate    SPECIAL CARE FACTORS FREQUENCY  PT (By licensed PT), OT (By licensed OT)     PT Frequency: 5x wk OT Frequency: 5x wk            Contractures Contractures Info: Not present    Additional Factors Info  Code Status, Allergies Code Status Info: Full Code   Psychotropic Info: zoloft         Current Medications (01/12/2017):  This is the current hospital active medication list Current Facility-Administered Medications  Medication Dose Route Frequency Provider  Last Rate Last Dose  . acetaminophen (TYLENOL) tablet 650 mg  650 mg Oral Q6H PRN Lavina Hamman, MD       Or  . acetaminophen (TYLENOL) suppository 650 mg  650 mg Rectal Q6H PRN Lavina Hamman, MD      . acetylcysteine (MUCOMYST) 20 % nebulizer / oral solution 2 mL  2 mL Nebulization BID Robbie Lis, MD   2  mL at 01/12/17 0800  . Ampicillin-Sulbactam (UNASYN) 3 g in sodium chloride 0.9 % 100 mL IVPB  3 g Intravenous Q6H Ollis, Brandi L, NP   Stopped at 01/12/17 4239  . arformoterol (BROVANA) nebulizer solution 15 mcg  15 mcg Nebulization BID Lavina Hamman, MD   15 mcg at 01/12/17 0800  . budesonide (PULMICORT) nebulizer solution 0.5 mg  0.5 mg Nebulization BID Lavina Hamman, MD   0.5 mg at 01/12/17 0800  . fentaNYL (SUBLIMAZE) injection 100 mcg  100 mcg Intravenous Once Sharia Reeve, MD      . folic acid (FOLVITE) tablet 1 mg  1 mg Oral Daily Lavina Hamman, MD   1 mg at 01/12/17 1201  . ipratropium-albuterol (DUONEB) 0.5-2.5 (3) MG/3ML nebulizer solution 3 mL  3 mL Nebulization Q4H PRN Robbie Lis, MD      . ipratropium-albuterol (DUONEB) 0.5-2.5 (3) MG/3ML nebulizer solution 3 mL  3 mL Nebulization QID Robbie Lis, MD   3 mL at 01/12/17 1141  . lidocaine (XYLOCAINE) 2 % jelly 1 application  1 application Other Once Panchal, Hyman Hopes, MD      . midazolam (VERSED) injection 2 mg  2 mg Intravenous Once Sharia Reeve, MD      . ondansetron (ZOFRAN) injection 4 mg  4 mg Intravenous Q6H PRN Lavina Hamman, MD   4 mg at 01/11/17 1107  . pantoprazole (PROTONIX) EC tablet 40 mg  40 mg Oral BID Jerene Bears, MD   40 mg at 01/12/17 1201  . polyethylene glycol (MIRALAX / GLYCOLAX) packet 17 g  17 g Oral Daily PRN Lavina Hamman, MD   17 g at 01/12/17 1202  . sertraline (ZOLOFT) tablet 100 mg  100 mg Oral QHS Lavina Hamman, MD   100 mg at 01/11/17 2118  . sodium chloride flush (NS) 0.9 % injection 3 mL  3 mL Intravenous Q12H Lavina Hamman, MD   3 mL at 01/12/17 1202     Discharge Medications: Please see discharge summary for a list of discharge medications.  Relevant Imaging Results:  Relevant Lab Results:   Additional Information ss # [id number]  Licet Dunphy, Randall An, LCSW

## 2017-01-12 NOTE — Clinical Social Work Placement (Signed)
   CLINICAL SOCIAL WORK PLACEMENT  NOTE  Date:  01/12/2017  Patient Details  Name: Gary Boyer MRN: [id number] Date of Birth: 16-Nov-1935  Clinical Social Work is seeking post-discharge placement for this patient at the [place] level of care (*CSW will initial, date and re-position this form in  chart as items are completed):  Yes   Patient/family provided with [place] Work Department's list of facilities offering this level of care within the geographic area requested by the patient (or if unable, by the patient's family).  Yes   Patient/family informed of their freedom to choose among providers that offer the needed level of care, that participate in Medicare, Medicaid or managed care program needed by the patient, have an available bed and are willing to accept the patient.  Yes   Patient/family informed of [place]'s ownership interest in [facility] and [facility], as well as of the fact that they are under no obligation to receive care at these facilities.  PASRR submitted to EDS on       PASRR number received on       Existing PASRR number confirmed on 01/12/17     FL2 transmitted to all facilities in geographic area requested by pt/family on 01/12/17     FL2 transmitted to all facilities within larger geographic area on       Patient informed that his/her managed care company has contracts with or will negotiate with certain facilities, including the following:            Patient/family informed of bed offers received.  Patient chooses bed at       Physician recommends and patient chooses bed at      Patient to be transferred to   on  .  Patient to be transferred to facility by       Patient family notified on   of transfer.  Name of family member notified:        PHYSICIAN       Additional Comment:    _______________________________________________ Luretha Rued, [place] 01/12/2017, 2:55 PM

## 2017-01-13 ENCOUNTER — Encounter (HOSPITAL_COMMUNITY): Payer: Self-pay | Admitting: Internal Medicine

## 2017-01-13 ENCOUNTER — Inpatient Hospital Stay (HOSPITAL_COMMUNITY): Payer: Medicare Other

## 2017-01-13 DIAGNOSIS — J9601 Acute respiratory failure with hypoxia: Secondary | ICD-10-CM

## 2017-01-13 LAB — CULTURE, RESPIRATORY: CULTURE: NORMAL

## 2017-01-13 LAB — PROTIME-INR
INR: 2
PROTHROMBIN TIME: 23 s — AB (ref 11.4–15.2)

## 2017-01-13 LAB — CULTURE, RESPIRATORY W GRAM STAIN

## 2017-01-13 LAB — BRAIN NATRIURETIC PEPTIDE: B Natriuretic Peptide: 463.8 pg/mL — ABNORMAL HIGH (ref 0.0–100.0)

## 2017-01-13 LAB — PROCALCITONIN: Procalcitonin: 0.33 ng/mL

## 2017-01-13 MED ORDER — FUROSEMIDE 10 MG/ML IJ SOLN
40.0000 mg | Freq: Once | INTRAMUSCULAR | Status: AC
  Administered 2017-01-13: 40 mg via INTRAVENOUS
  Filled 2017-01-13: qty 4

## 2017-01-13 MED ORDER — TRAZODONE HCL 50 MG PO TABS
50.0000 mg | ORAL_TABLET | Freq: Every evening | ORAL | Status: DC | PRN
  Administered 2017-01-13 – 2017-01-15 (×3): 50 mg via ORAL
  Filled 2017-01-13 (×3): qty 1

## 2017-01-13 MED ORDER — SODIUM BICARBONATE/SODIUM CHLORIDE MOUTHWASH
OROMUCOSAL | Status: DC | PRN
  Filled 2017-01-13: qty 1000

## 2017-01-13 MED ORDER — BISACODYL 10 MG RE SUPP
10.0000 mg | Freq: Once | RECTAL | Status: AC
Start: 2017-01-13 — End: 2017-01-13
  Administered 2017-01-13: 10 mg via RECTAL
  Filled 2017-01-13: qty 1

## 2017-01-13 NOTE — Progress Notes (Addendum)
Patient ID: Gary Boyer, male   DOB: July 07, 1935, 81 y.o.   MRN: [id number]  PROGRESS NOTE    Gary Boyer  WFU:[id number] DOB: 08-Jan-1936 DOA: 01/06/2017  PCP: [name], MD   Brief Narrative:  81 year old male with history of a fib, PE, on coumadin, chronic CHF, COPD. Pt presented to [facility] ED with coffee-ground emesis started the night prior to the admission. Pt was recently treated with doxycyline and prednisone for UTI.  On admission, hgb was 7, pt was hypotensive and FOBT was positive. GI consulted. Patient has so far received 4 units of PRBC and 2 units of FFP transfusion. There is no overt GI bleed since admission. Patient has chronic breathing trouble and he supposed to be on home oxygen. He has required Ventimask and nasal cannula to keep oxygen saturation above 90%.  During this hospital stay pt had worsening CXR and he underwent bronchoscopy 7/8. He also underwent EGD 7/9 which showed esophagitis in the lower third of the esophagus and it had contact bleeding which stopped spontaneously. Pt was also found to have three 4-7 mm bleeding angiodysplastic lesions in the stomach, 1 actively bleeding.  His hospital course remains complicated with ongoing hypoxia and requirement for high flow oxygen.  Assessment & Plan:   Principal Problem: Acute upper GI bleed / Acute blood loss anemia / Acute esophagitis  - Hgb 6.9 on admission - FOBT was positive - S/P 4 U PRBC and 2 U FFP transfusion during this hospital stay - EGD 7/9 which showed esophagitis in the lower third of the esophagus and it had contact bleeding which stopped spontaneously. Pt was also found to have three 4-7 mm bleeding angiodysplastic lesions on the lesser curvature of the gastric body, one of them was actively bleeding. The largest lesion continue to ooze even after treatment and then 3 hemostatic clips were placed and bleeding stopped. - Continue PPI therapy - Per GI, difficult to say when [facility] can be resumed, hgb  needs to be monitored closely  Active Problems: Acute respiratory failure with hypoxia / Acute COPD exacerbation  - CXR 7/7 showed complete opacification of the right hemithorax, has worsened since prior study - Pt seen by pulmonary, s/p bronchoscopy 7/8 with some clearing up of the right heimthorax based on CXR 7/8 after bronch - Pt given lasix x once 7/8 20 mg IV - Stopped steroids 01/10/2017 per pulmonary - Chest x-ray 01/13/2017 showed stable bilateral lung opacities concerning for edema or atelectasis - Appreciate pulmonary following and their recommendations - Continue Mucomyst nebulizer twice daily, Brovana and Pulmicort nebulizer twice daily, DuoNeb nebulizer 4 times daily - Continue oxygen support via nasal cannula, non-rebreather versus BiPAP to keep oxygen saturation above 90%  Morbid obesity due to excess calories (HCC) Counseled on diet, nutrition score 4 - Given 10 mg IV vitamin K to reverse INR - Anticoagulation on hold due to risk of bleeding  Chronic diastolic CHF - Fairly reasonably compensated  Pulmonary embolism (HCC)  - Anticoagulation on hold due to risk of bleeding. Per GI, plan to restart anticoagulation therapy is difficult to answer, with a delay at least 5 days and encourage discussion about risk-benefits of resuming Coumadin with patient and family  CKD stage 4 - Cr in 2016 as high as 2.9 - Creatinine remains within baseline range  Aspiration pneumonia of right lower lobe (HCC) - Completed 7 days of Unasyn  Protein calorie malnutrition, moderate - In the context of acute on chronic illness - Diet as  tolerated  Depression - Continue Zoloft   DVT prophylaxis:  SCDs due to risk of bleeding Code Status: full code  Family Communication:  family not at the bedside Disposition Plan:  remains in step down due to requirement for high flow oxygen   Consultants:   GI, Dr. Owens Loffler  Pulmonary   Procedures:    Bronchoscopy 7/8   EGD 7/9 which showed esophagitis in the lower third of the esophagus and it had contact bleeding which stopped spontaneously. Pt was also found to have three 4-7 mm bleeding angiodysplastic lesions on the lesser curvature of the gastric body, one of them was actively bleeding. The largest lesion continue to ooze even after treatment and then 3 hemostatic clips were placed and bleeding stopped.  Antimicrobials:   Unasyn 7/4 --> 7/11   Subjective: Patient hypoxic overnight, on high flow oxygen. Attempted BiPAP but then placed patient on 15 L oxygen support.  Objective: Vitals:   01/13/17 0335 01/13/17 0343 01/13/17 0757 01/13/17 0800  BP:  132/66    Pulse:  75    Resp:  (!) 22    Temp: 97.9 F (36.6 C)   97.8 F (36.6 C)  TempSrc: Oral   Oral  SpO2:  95% 94%   Weight: 125.9 kg (277 lb 9 oz)     Height:        Intake/Output Summary (Last 24 hours) at 01/13/17 0857 Last data filed at 01/13/17 0600  Gross per 24 hour  Intake              100 ml  Output             1800 ml  Net            -1700 ml   Filed Weights   01/10/17 0500 01/11/17 0500 01/13/17 0335  Weight: 118.5 kg (261 lb 3.9 oz) 119.6 kg (263 lb 10.7 oz) 125.9 kg (277 lb 9 oz)    Examination:  General exam: Appears calm and comfortable  Respiratory system: Diminished, coarse breath sounds Cardiovascular system: S1 & S2 heard, rate controlled Gastrointestinal system: Abdomen is nondistended, soft and nontender. No organomegaly or masses felt. Normal bowel sounds heard. Central nervous system: No focal deficits Extremities: Symmetric 5 x 5 power. (+) edema Skin: No rashes, lesions or ulcers Psychiatry: Normal mood, behavior.   Data Reviewed: I have personally reviewed following labs and imaging studies  CBC:  Recent Labs Lab 01/07/17 0758 01/07/17 1757 01/09/17 0355 01/10/17 0348 01/11/17 0317 01/12/17 0328  WBC 7.8  --  7.8 8.1 5.4 6.0  HGB 7.8* 9.2* 8.6* 8.8* 8.6* 8.7*  HCT  26.0* 30.5* 29.5* 30.6* 29.7* 29.1*  MCV 79.3  --  82.9 83.4 80.7 82.2  PLT 234  --  208 184 190 341   Basic Metabolic Panel:  Recent Labs Lab 01/07/17 0152 01/09/17 0355 01/10/17 0348 01/11/17 0317 01/12/17 0328  NA 142 142 144 142 143  K 3.9 4.3 4.4 4.9 4.5  CL 103 104 106 106 104  CO2 31 29 34* 30 32  GLUCOSE 96 105* 123* 124* 95  BUN 47* 33* 39* 40* 37*  CREATININE 1.48* 1.51* 1.65* 1.57* 1.37*  CALCIUM 8.6* 8.7* 8.8* 8.6* 8.6*  MG 2.0  --   --   --   --    GFR: Estimated Creatinine Clearance: 59.9 mL/min (A) (by C-G formula based on SCr of 1.37 mg/dL (H)). Liver Function Tests:  Recent Labs Lab 01/07/17 0152  AST 15  ALT 8*  ALKPHOS 56  BILITOT 0.9  PROT 6.2*  ALBUMIN 2.2*   No results for input(s): LIPASE, AMYLASE in the last 168 hours. No results for input(s): AMMONIA in the last 168 hours. Coagulation Profile:  Recent Labs Lab 01/09/17 0355 01/10/17 0348 01/11/17 0317 01/12/17 0328 01/13/17 0307  INR 1.51 1.67 1.73 1.77 2.00   Cardiac Enzymes: No results for input(s): CKTOTAL, CKMB, CKMBINDEX, TROPONINI in the last 168 hours. BNP (last 3 results) No results for input(s): PROBNP in the last 8760 hours. HbA1C: No results for input(s): HGBA1C in the last 72 hours. CBG: No results for input(s): GLUCAP in the last 168 hours. Lipid Profile: No results for input(s): CHOL, HDL, LDLCALC, TRIG, CHOLHDL, LDLDIRECT in the last 72 hours. Thyroid Function Tests: No results for input(s): TSH, T4TOTAL, FREET4, T3FREE, THYROIDAB in the last 72 hours. Anemia Panel: No results for input(s): VITAMINB12, FOLATE, FERRITIN, TIBC, IRON, RETICCTPCT in the last 72 hours. Urine analysis:    Component Value Date/Time   COLORURINE AMBER (A) 01/11/2015 0659   APPEARANCEUR CLOUDY (A) 01/11/2015 0659   LABSPEC 1.023 01/11/2015 0659   PHURINE 5.0 01/11/2015 0659   GLUCOSEU NEGATIVE 01/11/2015 0659   HGBUR MODERATE (A) 01/11/2015 0659   BILIRUBINUR SMALL (A) 01/11/2015  0659   KETONESUR NEGATIVE 01/11/2015 0659   PROTEINUR 30 (A) 01/11/2015 0659   UROBILINOGEN 0.2 01/11/2015 0659   NITRITE NEGATIVE 01/11/2015 0659   LEUKOCYTESUR MODERATE (A) 01/11/2015 0659   Sepsis Labs: @LABRCNTIP (procalcitonin:4,lacticidven:4)   ) Recent Results (from the past 240 hour(s))  MRSA PCR Screening     Status: None   Collection Time: 01/06/17  9:27 AM  Result Value Ref Range Status   MRSA by PCR NEGATIVE NEGATIVE Final    Comment:        The GeneXpert MRSA Assay (FDA approved for NASAL specimens only), is one component of a comprehensive MRSA colonization surveillance program. It is not intended to diagnose MRSA infection nor to guide or monitor treatment for MRSA infections.   Culture, respiratory (NON-Expectorated)     Status: None (Preliminary result)   Collection Time: 01/10/17  5:30 PM  Result Value Ref Range Status   Specimen Description BRONCHIAL ALVEOLAR LAVAGE RIGHT  Final   Special Requests NONE  Final   Gram Stain   Final    MODERATE WBC PRESENT, PREDOMINANTLY PMN NO ORGANISMS SEEN    Culture   Final    CULTURE REINCUBATED FOR BETTER GROWTH Performed at [facility], [place] [address] [place], [place] [address]    Report Status PENDING  Incomplete      Radiology Studies: Dg Chest Port 1 View  Result Date: 01/13/2017 CLINICAL DATA:  Respiratory failure, hypoxia. EXAM: PORTABLE CHEST 1 VIEW COMPARISON:  Radiograph of January 12, 2017. FINDINGS: Stable cardiomegaly and central pulmonary vascular congestion. No pneumothorax is noted. Degenerative changes are seen involving both shoulders. Stable bilateral perihilar and basilar densities are noted concerning for edema or atelectasis with mild associated pleural effusions. Bony thorax is unremarkable. IMPRESSION: Stable bilateral lung opacities are noted concerning for edema or atelectasis with associated pleural effusions. Electronically Signed   By: Marijo Conception, M.D.   On: 01/13/2017 07:22    Dg Chest Port 1 View  Result Date: 01/12/2017 CLINICAL DATA:  Respiratory failure EXAM: PORTABLE CHEST 1 VIEW COMPARISON:  January 10, 2017 FINDINGS: There is a persistent right pleural effusion with consolidation in the right upper lobe and right base regions. There is mild left  base atelectasis. Heart is mildly enlarged with pulmonary vascularity within normal limits. There is aortic atherosclerosis. No adenopathy. There is arthropathy in each shoulder. IMPRESSION: Persistent right pleural effusion with areas of consolidation on the right, unchanged. There is stable left base atelectasis. Overall no new opacity. Stable cardiac prominence. There is aortic atherosclerosis. Aortic Atherosclerosis (ICD10-I70.0). Electronically Signed   By: Lowella Grip III M.D.   On: 01/12/2017 07:09   Dg Chest Port 1 View  Result Date: 01/10/2017 CLINICAL DATA:  Atelectasis.  Shortness of breath EXAM: PORTABLE CHEST 1 VIEW COMPARISON:  Study obtained earlier in the day and January 09, 2017 FINDINGS: There is stable consolidation in the right upper lobe and right base regions. There is a small right pleural effusion. There is also patchy atelectasis in the left base. No new opacity compared earlier in the day. Heart is mildly enlarged with pulmonary vascularity within normal limits. No evident adenopathy. There is arthropathy in each shoulder. IMPRESSION: Areas of consolidation and volume loss on the right. Small right pleural effusion. Patchy atelectasis left base. No new opacity compared to earlier in the day. Stable cardiac silhouette. Electronically Signed   By: Lowella Grip III M.D.   On: 01/10/2017 17:51   Dg Chest Port 1 View  Result Date: 01/10/2017 CLINICAL DATA:  Atelectasis.  Shortness breath. EXAM: PORTABLE CHEST 1 VIEW COMPARISON:  01/09/2017 FINDINGS: The left costophrenic angle is not included. Technologist note indicates: "ordering MD did not needed the image to be repeated to include costophrenic  angles." There is significantly improved aeration in the right hemithorax. Continued volume loss on the right with shift of cardiac and mediastinal structures to the right. This is difficult to quantify given the rightward rotation of the chest during imaging. Continued obscuration of the right hemidiaphragm possibly from layering effusion, atelectasis, or pneumonia. Poor definition of the right mediastinal margin. Vague densities at the left lung base. Indistinct pulmonary vasculature. Potential enlargement of the cardiopericardial silhouette. Considerable degenerative arthropathy of the right glenohumeral joint. Reduced acromiohumeral distance on the right suspicious for chronic rotator cuff tear. IMPRESSION: 1. Interval improved aeration in the right hemithorax, but with continued considerable airspace opacity especially at the right lung base and adjacent to the right mediastinum. Cannot exclude layering right pleural effusion. 2. Indistinct airspace opacity in left lower lobe, potentially from edema or pneumonia. 3. Suspected cardiomegaly. Indistinct pulmonary vasculature suggesting pulmonary venous hypertension and potentially mild interstitial edema. 4. Chronic right rotator cuff tear with advanced degenerative glenohumeral arthropathy on the right. Electronically Signed   By: Van Clines M.D.   On: 01/10/2017 17:20   Dg Chest Port 1 View  Result Date: 01/09/2017 CLINICAL DATA:  Worsening shortness of breath. Question aspiration pneumonia. EXAM: PORTABLE CHEST 1 VIEW COMPARISON:  January 06, 2017 FINDINGS: There is complete opacification of the right hemithorax which is worsened in the interval. There is volume loss on the right with shift of the heart mediastinum. Diffuse interstitial opacities in the left lung have worsened. No other interval changes. No pneumothorax. IMPRESSION: 1. Complete opacification of the right hemithorax has worsened in the interval. Recommend clinical correlation and  follow-up to resolution. 2. Diffuse interstitial opacities in the left lung are worse in the interval most consistent with edema. Electronically Signed   By: Dorise Bullion III M.D   On: 01/09/2017 09:37        Scheduled Meds: . acetylcysteine  2 mL Nebulization BID  . arformoterol  15 mcg Nebulization BID  . budesonide  0.5 mg Nebulization BID  . fentaNYL (SUBLIMAZE) injection  100 mcg Intravenous Once  . folic acid  1 mg Oral Daily  . ipratropium-albuterol  3 mL Nebulization QID  . lidocaine  1 application Other Once  . midazolam  2 mg Intravenous Once  . pantoprazole  40 mg Oral BID  . sertraline  100 mg Oral QHS  . sodium chloride flush  3 mL Intravenous Q12H   Continuous Infusions:   LOS: 7 days    Time spent: 35 minutes  Greater than 50% of the time spent on counseling and coordinating the care.   Leisa Lenz, MD [facility] Pager [phone]  If 7PM-7AM, please contact night-coverage [email or web address] Password [facility] 01/13/2017, 8:57 AM

## 2017-01-13 NOTE — Progress Notes (Signed)
PULMONARY / CRITICAL CARE MEDICINE   Name: Gary Boyer MRN: [id number] DOB: 11/11/1935    ADMISSION DATE:  01/06/2017 CONSULTATION DATE:  01/10/2017  REFERRING MD:  Dr. Rogers Blocker  CHIEF COMPLAINT:  Chest x-ray abnormality  BRIEF SUMMARY:  81 y/o M admitted 7/4 with coffee ground emesis.  Pt was pending an EGD.  He had a CXR that showed R sided opacification which prompted pulmonary evaluation.  He improved with chest PT and mucomyst.  Underwent bronchoscopy on 7/8 with removal of thick mucoid secretions.    PMH - A. fib, PE on Coumadin, chronic CHF.   SUBJECTIVE:   Feels better  VITAL SIGNS: BP 120/72 (BP Location: Left Arm)   Pulse 78   Temp 97.8 F (36.6 C) (Oral)   Resp (!) 22   Ht 6' (1.829 m)   Wt 277 lb 9 oz (125.9 kg)   SpO2 97%   BMI 37.64 kg/m  15 liters high flow  VENTILATOR SETTINGS:    INTAKE / OUTPUT: I/O last 3 completed shifts: In: 400 [IV Piggyback:400] Out: 2200 [Urine:2200]  Physical Exam  Constitutional: He appears well-developed. No distress.  HENT:  Head: Normocephalic.  Mouth/Throat: Oropharynx is clear and moist and mucous membranes are normal.  Eyes: Conjunctivae and EOM are normal.  Neck: Trachea normal. No JVD present. No thyroid mass present.  Cardiovascular: Normal heart sounds and normal pulses.  A regularly irregular rhythm present.  No extrasystoles are present.  Pulmonary/Chest: No accessory muscle usage. No respiratory distress. He has decreased breath sounds. He has rales in the right lower field.  Abdominal: Soft. Normal appearance and bowel sounds are normal.  Musculoskeletal: Normal range of motion.  Neurological: He is alert. GCS eye subscore is 4. GCS verbal subscore is 5. GCS motor subscore is 6.  Generalized weakness  Skin: Capillary refill takes less than 2 seconds. Bruising and ecchymosis noted. No rash noted. He is not diaphoretic. Nails show no clubbing.  Psychiatric: He has a normal mood and affect. His speech is normal.      LABS:  BMET  Recent Labs Lab 01/10/17 0348 01/11/17 0317 01/12/17 0328  NA 144 142 143  K 4.4 4.9 4.5  CL 106 106 104  CO2 34* 30 32  BUN 39* 40* 37*  CREATININE 1.65* 1.57* 1.37*  GLUCOSE 123* 124* 95    Electrolytes  Recent Labs Lab 01/07/17 0152  01/10/17 0348 01/11/17 0317 01/12/17 0328  CALCIUM 8.6*  < > 8.8* 8.6* 8.6*  MG 2.0  --   --   --   --   < > = values in this interval not displayed.  CBC  Recent Labs Lab 01/10/17 0348 01/11/17 0317 01/12/17 0328  WBC 8.1 5.4 6.0  HGB 8.8* 8.6* 8.7*  HCT 30.6* 29.7* 29.1*  PLT 184 190 194    Coag's  Recent Labs Lab 01/11/17 0317 01/12/17 0328 01/13/17 0307  INR 1.73 1.77 2.00    Sepsis Markers No results for input(s): LATICACIDVEN, PROCALCITON, O2SATVEN in the last 168 hours.  ABG No results for input(s): PHART, PCO2ART, PO2ART in the last 168 hours.  Liver Enzymes  Recent Labs Lab 01/07/17 0152  AST 15  ALT 8*  ALKPHOS 56  BILITOT 0.9  ALBUMIN 2.2*    Cardiac Enzymes No results for input(s): TROPONINI, PROBNP in the last 168 hours.  Glucose No results for input(s): GLUCAP in the last 168 hours.  Imaging Dg Chest Port 1 View  Result Date: 01/13/2017 CLINICAL DATA:  Respiratory  failure, hypoxia. EXAM: PORTABLE CHEST 1 VIEW COMPARISON:  Radiograph of January 12, 2017. FINDINGS: Stable cardiomegaly and central pulmonary vascular congestion. No pneumothorax is noted. Degenerative changes are seen involving both shoulders. Stable bilateral perihilar and basilar densities are noted concerning for edema or atelectasis with mild associated pleural effusions. Bony thorax is unremarkable. IMPRESSION: Stable bilateral lung opacities are noted concerning for edema or atelectasis with associated pleural effusions. Electronically Signed   By: Marijo Conception, M.D.   On: 01/13/2017 07:22   STUDIES: EGD 7/9 >> LA Grade D (one or more mucosal breaks involving at least 75% of esophageal circumference)  esophagitis was found in the lower third of the esophagus. Esophagitis was friable with contact bleeding which stopped spontaneously.  A large, approximately 5 cm, hiatal hernia was present. Three 4 to 7 mm bleeding angiodysplastic lesions were found on the lesser curvature of the gastric body.  One of these lesions was actively bleeding. Area around each lesion injected with epinephrine.  The largest (middle) lesion continued to ooze after treatment and three hemostatic clips were successfully placed (MR conditional). There was no bleeding at the end of the procedure.  EVENTS:  7/04  Admit with coffee ground emesis  7/07  R hemothorax opacification 7/08  PCCM consulted for R sided hemithorax opacification improved with chest PT, mucomyst.  FOB completed   CULTURES: MRSA PCR 7/4 >> negative Sputum / BAL 7/8 >> mod WBC >>  ANTIBIOTICS: Unasyn 7/4 >>     ASSESSMENT / PLAN:  Acute hypoxic respiratory failure in setting of Right PNA / Suspected Aspiration Pulmonary edema vs ALI -Remote Pulmonary emboli (2011 or 2012) -completed 7d unasyn  -cxr personally reviewed 7/11 w/ worsening aeration overall. Can't exclude element of edema although ALI or TRALI also on ddx   -2 liters + -still on HFNL  Plan:   Cont pulm hygiene  Lasix x 1 Dc mucomyst  Ck BNP Trend PCT, wbc and fever curve   GIB - PUD vs gastritis, EGD >> esophagitis, vascular ectasias Plan:  Cont PPI BID Further recs per GI  A. Fib - CHADS VASC 4 Chronic diastolic CHF Plan:  Cont SDU monitoring w/ tele Hold norvasc and vasotec for now    CKD4 w/ acute on chronic renal failure  Plan:   Lasix today  F/u am chemistry 7/12    Acute Blood Loss Anemia- in setting of GI bleeding and coumadin induced coagulopathy  required 4 units PRBC, 2 unit FFP since admit -H&H stable  Plan Trend cbc Holding AC   Constipation  Plan Dulcolax x 1  FAMILY  - Updates: Patient updated at bedside on plan of care.   Erick Colace ACNP-BC [place] Pager # [phone] OR # [phone] if no answer  Attending Note:  I have examined patient, reviewed labs, studies and notes. I have discussed the case with Jerrye Bushy, and I agree with the data and plans as amended above. 81 yo man with a fib, admitted w hematemesis and R aspiration PNA. His EGD confirmed distal esophagitis and vascular ectasia w stigmata of bleeding. He has a dense RLL / RML PNA. On eval he is is weak, has a weak cough. Decreased BS on the R. Regular heart exam. He has increased O2 requirement and now B infiltrates. He has completed abx. We will try to push pulm hygiene, diurese him today. Consider restart abx if he declines.   Baltazar Apo, MD, PhD 01/14/2017, 9:22 AM [facility] [phone] or if no answer [phone]

## 2017-01-13 NOTE — Progress Notes (Signed)
Pt having frequent incont stool, linen changes, repeat condom cath applications with diuresis during day so we did not get him out of bed.  But we did active range of motion, sitting up.   Continues to need hi flow oxygen.    Will plan OOB tomorrow.

## 2017-01-14 ENCOUNTER — Inpatient Hospital Stay (HOSPITAL_COMMUNITY): Payer: Medicare Other

## 2017-01-14 LAB — BASIC METABOLIC PANEL
Anion gap: 7 (ref 5–15)
BUN: 24 mg/dL — AB (ref 6–20)
CALCIUM: 8.3 mg/dL — AB (ref 8.9–10.3)
CHLORIDE: 101 mmol/L (ref 101–111)
CO2: 35 mmol/L — ABNORMAL HIGH (ref 22–32)
CREATININE: 1.16 mg/dL (ref 0.61–1.24)
GFR calc non Af Amer: 58 mL/min — ABNORMAL LOW (ref >60)
Glucose, Bld: 101 mg/dL — ABNORMAL HIGH (ref 65–99)
Potassium: 4.1 mmol/L (ref 3.5–5.1)
Sodium: 143 mmol/L (ref 135–145)

## 2017-01-14 LAB — CBC
HCT: 29.9 % — ABNORMAL LOW (ref 39.0–52.0)
Hemoglobin: 8.8 g/dL — ABNORMAL LOW (ref 13.0–17.0)
MCH: 24.3 pg — ABNORMAL LOW (ref 26.0–34.0)
MCHC: 29.4 g/dL — ABNORMAL LOW (ref 30.0–36.0)
MCV: 82.6 fL (ref 78.0–100.0)
PLATELETS: 160 10*3/uL (ref 150–400)
RBC: 3.62 MIL/uL — ABNORMAL LOW (ref 4.22–5.81)
RDW: 19.8 % — AB (ref 11.5–15.5)
WBC: 5.8 10*3/uL (ref 4.0–10.5)

## 2017-01-14 LAB — BRAIN NATRIURETIC PEPTIDE: B Natriuretic Peptide: 306.7 pg/mL — ABNORMAL HIGH (ref 0.0–100.0)

## 2017-01-14 LAB — PROTIME-INR
INR: 2.16
Prothrombin Time: 24.5 seconds — ABNORMAL HIGH (ref 11.4–15.2)

## 2017-01-14 MED ORDER — SODIUM CHLORIDE 3 % IN NEBU
4.0000 mL | INHALATION_SOLUTION | Freq: Two times a day (BID) | RESPIRATORY_TRACT | Status: AC
  Administered 2017-01-14 – 2017-01-16 (×5): 4 mL via RESPIRATORY_TRACT
  Administered 2017-01-17: 15 mL via RESPIRATORY_TRACT
  Filled 2017-01-14 (×6): qty 4

## 2017-01-14 MED ORDER — POTASSIUM CHLORIDE CRYS ER 20 MEQ PO TBCR
40.0000 meq | EXTENDED_RELEASE_TABLET | Freq: Once | ORAL | Status: AC
  Administered 2017-01-14: 40 meq via ORAL
  Filled 2017-01-14: qty 2

## 2017-01-14 MED ORDER — FUROSEMIDE 10 MG/ML IJ SOLN
80.0000 mg | Freq: Two times a day (BID) | INTRAMUSCULAR | Status: DC
  Administered 2017-01-14 – 2017-01-15 (×3): 80 mg via INTRAVENOUS
  Filled 2017-01-14 (×3): qty 8

## 2017-01-14 MED ORDER — POTASSIUM CHLORIDE 20 MEQ/15ML (10%) PO SOLN
40.0000 meq | Freq: Once | ORAL | Status: DC
  Filled 2017-01-14: qty 30

## 2017-01-14 NOTE — Progress Notes (Signed)
PULMONARY / CRITICAL CARE MEDICINE   Name: Gary Boyer MRN: [id number] DOB: [birth year]    ADMISSION DATE:  01/06/2017 CONSULTATION DATE:  01/10/2017  REFERRING MD:  Dr. Rogers Blocker  CHIEF COMPLAINT:  Chest x-ray abnormality  BRIEF SUMMARY:  81 y/o M admitted 7/4 with coffee ground emesis.  Pt was pending an EGD.  He had a CXR that showed R sided opacification which prompted pulmonary evaluation.  He improved with chest PT and mucomyst.  Underwent bronchoscopy on 7/8 with removal of thick mucoid secretions.    PMH - A. fib, PE on Coumadin, chronic CHF.   SUBJECTIVE:   Not better. Still on high FIO2   VITAL SIGNS: BP 111/66   Pulse 71   Temp (!) 97 F (36.1 C) (Axillary)   Resp (!) 25   Ht 6' (1.829 m)   Wt 277 lb 9 oz (125.9 kg)   SpO2 92%   BMI 37.64 kg/m  15 liters high flow  VENTILATOR SETTINGS: FiO2 (%):  [93 %] 93 %  INTAKE / OUTPUT: I/O last 3 completed shifts: In: 96 [P.O.:720] Out: 3200 [Urine:3200]  Physical Exam  Constitutional: He is oriented to person, place, and time. He is cooperative.  Non-toxic appearance. He has a sickly appearance. No distress.  HENT:  Head: Normocephalic and atraumatic.  Mouth/Throat: Oropharynx is clear and moist and mucous membranes are normal.  Eyes: Pupils are equal, round, and reactive to light. Conjunctivae and EOM are normal.  Neck: No JVD present.  Cardiovascular: Normal pulses.  A regularly irregular rhythm present. Exam reveals no gallop.   + lower extremity edema   Pulmonary/Chest: No accessory muscle usage or stridor. No respiratory distress. He has decreased breath sounds in the right middle field and the right lower field. He has rhonchi.  Abdominal: Soft. Normal appearance and bowel sounds are normal. He exhibits no distension. There is no tenderness.  Genitourinary:  Genitourinary Comments: incont urine   Musculoskeletal:       Right shoulder: He exhibits decreased range of motion.       Left shoulder: He exhibits  decreased range of motion.       Right ankle: He exhibits swelling.       Left ankle: He exhibits swelling.       Right lower leg: He exhibits edema.       Left lower leg: He exhibits edema.  Neurological: He is alert and oriented to person, place, and time.  Skin: Skin is warm, dry and intact. He is not diaphoretic. There is pallor.  Psychiatric: He has a normal mood and affect. His speech is normal and behavior is normal. Judgment and thought content normal. Cognition and memory are normal.     LABS:  BMET  Recent Labs Lab 01/11/17 0317 01/12/17 0328 01/14/17 0300  NA 142 143 143  K 4.9 4.5 4.1  CL 106 104 101  CO2 30 32 35*  BUN 40* 37* 24*  CREATININE 1.57* 1.37* 1.16  GLUCOSE 124* 95 101*    Electrolytes  Recent Labs Lab 01/11/17 0317 01/12/17 0328 01/14/17 0300  CALCIUM 8.6* 8.6* 8.3*    CBC  Recent Labs Lab 01/11/17 0317 01/12/17 0328 01/14/17 0300  WBC 5.4 6.0 5.8  HGB 8.6* 8.7* 8.8*  HCT 29.7* 29.1* 29.9*  PLT 190 194 160    Coag's  Recent Labs Lab 01/12/17 0328 01/13/17 0307 01/14/17 0300  INR 1.77 2.00 2.16    Sepsis Markers  Recent Labs Lab 01/13/17 1020  PROCALCITON  0.33    ABG No results for input(s): PHART, PCO2ART, PO2ART in the last 168 hours.  Liver Enzymes No results for input(s): AST, ALT, ALKPHOS, BILITOT, ALBUMIN in the last 168 hours.  Cardiac Enzymes No results for input(s): TROPONINI, PROBNP in the last 168 hours.  Glucose No results for input(s): GLUCAP in the last 168 hours.  Imaging Dg Chest Port 1 View  Result Date: 01/14/2017 CLINICAL DATA:  Followup pneumonia EXAM: PORTABLE CHEST 1 VIEW COMPARISON:  01/13/2017 FINDINGS: Marked radiographic worsening with subtotal collapse of the right lung due to consolidation, pleural fluid and volume loss. Mediastinal shift towards the right. Patchy infiltrate persists at the left lung base. Left upper lung as well aerated. IMPRESSION: Worsened infiltrate, effusion  collapse of the right lung. Electronically Signed   By: Nelson Chimes M.D.   On: 01/14/2017 06:50   STUDIES: EGD 7/9 >> LA Grade D (one or more mucosal breaks involving at least 75% of esophageal circumference) esophagitis was found in the lower third of the esophagus. Esophagitis was friable with contact bleeding which stopped spontaneously.  A large, approximately 5 cm, hiatal hernia was present. Three 4 to 7 mm bleeding angiodysplastic lesions were found on the lesser curvature of the gastric body.  One of these lesions was actively bleeding. Area around each lesion injected with epinephrine.  The largest (middle) lesion continued to ooze after treatment and three hemostatic clips were successfully placed (MR conditional). There was no bleeding at the end of the procedure.  EVENTS:  7/04  Admit with coffee ground emesis  7/07  R hemothorax opacification 7/08  PCCM consulted for R sided hemithorax opacification improved with chest PT, mucomyst.  FOB completed  7/12 cxr worse. More collapse   CULTURES: MRSA PCR 7/4 >> negative Sputum / BAL 7/8 >> mod WBC >>nml flora   ANTIBIOTICS: Unasyn 7/4 >>  (completed 7d)   ASSESSMENT / PLAN:  Acute hypoxic respiratory failure in setting of Right PNA / Suspected Aspiration Pulmonary edema vs ALI c/b mucous plugging and right sided atelectasis/collapse  -Remote Pulmonary emboli (2011 or 2012) -completed 7d unasyn  -CXR personally reviewed 7/12: marked worsening of right sided collapse w/ mediastinal shift. Persistent LLL airspace disease.  -right sided [place] eval 7/12 NO effusion.  -still on high flow [place], very weak, cough mechanics and deconditioning are the major barriers to his progress. Fever curve and WBC flat Plan:   cont brovana & pulmicort Added 3% neb Added Chest vest Get OOB NTS PRN Lasix X2 Change BIPAP to cont/PRN -I am concerned he may require intubation  GIB - PUD vs gastritis, EGD >> esophagitis, vascular ectasias Plan:   PPI Diet as tolerated  A. Fib - CHADS VASC 4 Chronic diastolic CHF Plan:  Tele  Not a candidate for ac d/t bleeding     CKD4 w/ acute on chronic renal failure  Tolerated diuresis but volume status still positive  Plan:   Escalated lasix Empiric potassium replacement  Repeat chem in am    Acute Blood Loss Anemia- in setting of GI bleeding and coumadin induced coagulopathy  required 4 units PRBC, 2 unit FFP since admit -H&H stable  Plan Trend cbc Transfuse as indicated   FAMILY  - Updates: Patient updated at bedside on plan of care.   My cct 38 minutes.   Erick Colace ACNP-BC [place] Pager # [phone] OR # [phone] if no answer  Attending Note:  I have examined patient, reviewed labs, studies and notes.  I have discussed the case with Jerrye Bushy, and I agree with the data and plans as amended above.  81 yo man, hx A fib formerly on anticoag, admitted with esophagitis / vascular ectasia and a brisk UGIB c/b emesis and a RLL / RML aspiration PNA. He has completed Unasyn course, is no longer febrile. WBC normal. He received empiric diuresis 7/11. On my eval he remains quite weak, he has a poor cough and does not appear to move any secretions. Minimal R sided air movement. Heart irregular, abdomen benign. CXR shows RLL and RML volume loss that is significantly worse. We need to push pulm hygiene - will add chest vest today, get him OOB to chair. He may benefit from FOB to clear secretions and to get cx data, but at this time he would likely require intubation to tolerate. Will continue to follow off abx, consider restarting to treat HCAP if spikes temp or any clinical decline.   Independent critical care time is 35 minutes.   Baltazar Apo, MD, PhD 01/14/2017, 9:30 AM [facility] [phone] or if no answer [phone]

## 2017-01-14 NOTE — Progress Notes (Signed)
PT Cancellation Note  Patient Details Name: Jerardo Costabile MRN: [id number] DOB: 15-Apr-1936   Cancelled Treatment:     pt OOB with nursing via lift.  Pt has been evaluated by PT with rec for SNF.   Noted most recent chest X ray showing more collapse.  Will cont to monitor for progress.   Rica Koyanagi  PTA [facility]  Acute  Rehab Pager      [phone]

## 2017-01-14 NOTE — Care Management Note (Signed)
Case Management Note  Patient Details  Name: Gary Boyer MRN: [id number] Date of Birth: Mar 30, 1936  Subjective/Objective:          Coffee ground emesis          Action/Plan: Date:  January 14, 2017  Chart reviewed for concurrent status and case management needs.  Will continue to follow patient progress.  Discharge Planning: following for needs  Expected discharge date: [id number]  Velva Harman, BSN, [place], [place]   Expected Discharge Date:   (unknown)               Expected Discharge Plan:  Home/Self Care  In-House Referral:     Discharge planning Services  CM Consult  Post Acute Care Choice:    Choice offered to:     DME Arranged:    DME Agency:     HH Arranged:    [place] Agency:     Status of Service:  In process, will continue to follow  If discussed at Long Length of Stay Meetings, dates discussed:    Additional Comments:  Leeroy Cha, RN 01/14/2017, 9:18 AM

## 2017-01-14 NOTE — Progress Notes (Signed)
V-60 BiPAP removed from pts. room when pt. was relocated to1228 from 1229, has been unable to tolerate, RT to monitor.

## 2017-01-15 ENCOUNTER — Inpatient Hospital Stay (HOSPITAL_COMMUNITY): Payer: Medicare Other

## 2017-01-15 LAB — TYPE AND SCREEN
ABO/RH(D): O NEG
ANTIBODY SCREEN: NEGATIVE
UNIT DIVISION: 0
UNIT DIVISION: 0

## 2017-01-15 LAB — BASIC METABOLIC PANEL WITH GFR
Anion gap: 9 (ref 5–15)
BUN: 23 mg/dL — ABNORMAL HIGH (ref 6–20)
CO2: 35 mmol/L — ABNORMAL HIGH (ref 22–32)
Calcium: 8.5 mg/dL — ABNORMAL LOW (ref 8.9–10.3)
Chloride: 96 mmol/L — ABNORMAL LOW (ref 101–111)
Creatinine, Ser: 1.34 mg/dL — ABNORMAL HIGH (ref 0.61–1.24)
GFR calc Af Amer: 56 mL/min — ABNORMAL LOW
GFR calc non Af Amer: 49 mL/min — ABNORMAL LOW
Glucose, Bld: 91 mg/dL (ref 65–99)
Potassium: 4.5 mmol/L (ref 3.5–5.1)
Sodium: 140 mmol/L (ref 135–145)

## 2017-01-15 LAB — BPAM RBC
Blood Product Expiration Date: 201807302359
Blood Product Expiration Date: 201807302359
UNIT TYPE AND RH: 9500
Unit Type and Rh: 9500

## 2017-01-15 LAB — PROCALCITONIN: Procalcitonin: 0.13 ng/mL

## 2017-01-15 LAB — PROTIME-INR
INR: 1.87
Prothrombin Time: 21.8 s — ABNORMAL HIGH (ref 11.4–15.2)

## 2017-01-15 MED ORDER — SALINE SPRAY 0.65 % NA SOLN
1.0000 | NASAL | Status: DC | PRN
  Administered 2017-01-15 – 2017-01-19 (×2): 1 via NASAL
  Filled 2017-01-15: qty 44

## 2017-01-15 MED ORDER — FUROSEMIDE 10 MG/ML IJ SOLN
80.0000 mg | Freq: Every day | INTRAMUSCULAR | Status: DC
  Administered 2017-01-16 – 2017-01-20 (×5): 80 mg via INTRAVENOUS
  Filled 2017-01-15 (×6): qty 8

## 2017-01-15 MED ORDER — PREMIER PROTEIN SHAKE
11.0000 [oz_av] | ORAL | Status: DC
  Administered 2017-01-16 – 2017-01-21 (×6): 11 [oz_av] via ORAL
  Filled 2017-01-15 (×9): qty 325.31

## 2017-01-15 MED ORDER — ADULT MULTIVITAMIN W/MINERALS CH
1.0000 | ORAL_TABLET | Freq: Every day | ORAL | Status: DC
  Administered 2017-01-15 – 2017-01-22 (×8): 1 via ORAL
  Filled 2017-01-15 (×8): qty 1

## 2017-01-15 MED ORDER — NYSTATIN 100000 UNIT/ML MT SUSP
5.0000 mL | Freq: Four times a day (QID) | OROMUCOSAL | Status: DC
  Administered 2017-01-15 – 2017-01-22 (×26): 500000 [IU] via ORAL
  Filled 2017-01-15 (×25): qty 5

## 2017-01-15 NOTE — Progress Notes (Signed)
Pt. placed on BiPAP for h/s with initial setting from previously set at 18/12 titrated down to 12/8 due to leaks causing <'d sats, with 12 lpm oxygen bled in with to keep sats > 92% per order first with Large FFM, then with Med. FFM, pt. Was unable to tolerate either mask from leaks to being to tight, wants to try to have his home mask brought in to use, placed back on HFNC([place]) at 12 lpm, RT to monitor.

## 2017-01-15 NOTE — Progress Notes (Signed)
Initial Nutrition Assessment  DOCUMENTATION CODES:   Non-severe (moderate) malnutrition in context of chronic illness, Obesity unspecified  INTERVENTION:  - Will order Premier Protein once/day, this supplement provides 160 kcal and 30 grams of protein. - Will order daily multivitamin with minerals.  - Continue to encourage PO intakes of meals and supplement. - RD will continue to monitor for needs.  NUTRITION DIAGNOSIS:   Malnutrition (moderate/non-severe) related to chronic illness (COPD and osteoarthritis) as evidenced by mild depletion of body fat, mild depletion of muscle mass, energy intake < 75% for > or equal to 1 month.  GOAL:   Patient will meet greater than or equal to 90% of their needs  MONITOR:   PO intake, Supplement acceptance, Weight trends, Labs  REASON FOR ASSESSMENT:   LOS  ASSESSMENT:   81 y.o. male with Past medical history of A. fib, PE, chronic warfarin use, chronic CHF, chronic respiratory failure with hypoxia, COPD. Patient presented with complaints of coffee-ground emesis started overnight on 7/3. Family reports the patient has chronic cough. Recently treated with doxycycline as well as prednisone for UTI and osteoarthritis respectively. Has poor appetite and an unintentional weight loss. Family is unable to quantify the degree of weight loss.  Pt seen for LOS #9. BMI indicates obesity. Admission weight of 114.2 kg used to estimate nutrition needs as weight +11.7 kg since that time. Pt was NPO from admission untilo advancement to Regular diet 7/5 at 0918. He was then made NPO from midnight until 1254 on 7/6 and then changed back to Regular diet. He was again NPO from 7/9 at midnight until diet advanced to Soft at 1207 that day. Per chart review, he consumed 75% of dinner on 7/8; 50% of lunch and 25% of dinner on 7/11.   Pt's wife is at bedside and provides nearly all of the information. She states for breakfast his AM pt consumed 100% of blueberry muffin  and 2 cartons of whole milk with sugar added to them (pt likes his milk this way). He also had a few bites of grits with butter. This is the most pt has eaten for a meal since PTA. Pt's appetite has been declining over the past 1-1.5 years with notable difference in the past 6 months. He has osteoarthritis and chronic dislocation of shoulders which cause pain. He was able to feed himself until ~2 weeks ago when back pain became an issue. Wife fed pt breakfast this AM. He was not drinking oral nutrition supplements PTA; will trial Premier Protein.   Physical assessment shows mild edema to all extremities, mild fat, and mild muscle wasting. Pt reports he weighed 370 lbs at one time and believes that this was 1-1.5 years ago. Wife reports steady weight loss with no drastic losses. Per chart review, pt weighed 271 lbs on 4/9 at [facility]. Based on admission weight, pt has lost 20 lbs (7.4% body weight) in 3 months which is significant for time frame. Weight has been trending up since admission which may be attributable to edema to extremities.   Medications reviewed; 1 mg oral folic acid/day, 80 mg IV Lasix/day, 40 mg oral Protonix BID.  Labs reviewed; Cl: 96 mmol/L, BUN: 23 mg/dL, creatinine: 1.34 mg/dL, Ca: 8.5 mg/dL, GFR: 49 mL/min.       Diet Order:  DIET SOFT Room service appropriate? Yes; Fluid consistency: Thin  Skin:  Reviewed, no issues  Last BM:  7/12  Height:   Ht Readings from Last 1 Encounters:  01/06/17 6' (1.829  m)    Weight:   Wt Readings from Last 1 Encounters:  01/13/17 277 lb 9 oz (125.9 kg)    Ideal Body Weight:  80.91 kg  BMI:  Body mass index is 37.64 kg/m.  Estimated Nutritional Needs:   Kcal:  1485-1715 (13-15 kcal/kg)  Protein:  92-114 grams (0.8-1 grams/kg)  Fluid:  >/= 1.7 L/day  EDUCATION NEEDS:   No education needs identified at this time     Jarome Matin, MS, RD, LDN, CNSC Inpatient Clinical Dietitian Pager # [phone] After hours/weekend  pager # [phone]

## 2017-01-15 NOTE — Progress Notes (Signed)
PULMONARY / CRITICAL CARE MEDICINE   Name: Gary Boyer MRN:   [id number] DOB:   May 04, 1936             ADMISSION DATE:  01/06/2017 CONSULTATION DATE:  01/10/2017  REFERRING MD:  Dr. Rogers Blocker  CHIEF COMPLAINT:  Chest x-ray abnormality  BRIEF SUMMARY:  81 y/o M admitted 7/4 with coffee ground emesis.  Pt was pending an EGD.  He had a CXR that showed R sided opacification which prompted pulmonary evaluation.  He improved with chest PT and mucomyst.  Underwent bronchoscopy on 7/8 with removal of thick mucoid secretions.    PMH - A. fib, PE on Coumadin, chronic CHF.   SUBJECTIVE:   Feels better. Did not tolerate the BIPAP mask last night. Seems like it was a fit issue as well as a pressure issue. Objective   BP (!) 104/52 (BP Location: Left Arm)   Pulse (!) 38   Temp 98.2 F (36.8 C) (Oral)   Resp (!) 25   Ht 6' (1.829 m)   Wt 277 lb 9 oz (125.9 kg)   SpO2 (!) 87%   BMI 37.64 kg/m   Intake/Output Summary (Last 24 hours) at 01/15/17 0802 Last data filed at 01/15/17 0555  Gross per 24 hour  Intake                0 ml  Output             4900 ml  Net            -4900 ml  12 liters HFNC   General appearance:  81 Year old  Male,   NAD,  conversant  Eyes: anicteric sclerae, moist conjunctivae; PERRL, EOMI bilaterally. Mouth: small white capped mucosal ulcerations particularly on left > right oral mucosa; normal hard and soft palate difficult to assess d/t partials. Tongue is dry  Neck: Trachea midline; neck supple, no JVD Lungs/chest: a little improved c/w 6/12. Still a little more decreased on the right. Diffuse rhonchi, with normal respiratory effort and no intercostal retractions CV: regular irreg w/ AF on tele (CVR), no MRGs  Abdomen: Soft, non-tender; no masses or HSM Extremities: + peripheral edema but no extremity lymphadenopathy Skin: Normal temperature, turgor and texture; no rash Psych: Appropriate affect, alert and oriented to person, place and  time   CBC Recent Labs     01/14/17  0300  WBC  5.8  HGB  8.8*  HCT  29.9*  PLT  160    Coag's Recent Labs     01/13/17  0307  01/14/17  0300  01/15/17  0307  INR  2.00  2.16  1.87    BMET Recent Labs     01/14/17  0300  01/15/17  0307  NA  143  140  K  4.1  4.5  CL  101  96*  CO2  35*  35*  BUN  24*  23*  CREATININE  1.16  1.34*  GLUCOSE  101*  91    Electrolytes Recent Labs     01/14/17  0300  01/15/17  0307  CALCIUM  8.3*  8.5*    Sepsis Markers Recent Labs     01/13/17  1020  01/15/17  0307  PROCALCITON  0.33  0.13    ABG No results for input(s): PHART, PCO2ART, PO2ART in the last 72 hours.  Liver Enzymes No results for input(s): AST, ALT, ALKPHOS, BILITOT, ALBUMIN in the last 72 hours.  Cardiac Enzymes No results  for input(s): TROPONINI, PROBNP in the last 72 hours.  Glucose No results for input(s): GLUCAP in the last 72 hours.  Imaging Dg Chest Port 1 View  Result Date: 01/15/2017 CLINICAL DATA:  Pneumonia, history of asthma EXAM: PORTABLE CHEST 1 VIEW COMPARISON:  01/14/2017 and prior FINDINGS: Improving aeration bilaterally. Residual bilateral effusions, small on the left and moderate on the right. Bibasilar consolidations. Cardiomegaly with central vascular congestion but decreased compared to prior. IMPRESSION: 1. Improved aeration compared to most recent prior radiograph. 2. Bilateral effusions, small on the left and moderate on the right with bibasilar consolidations 3. Cardiomegaly with central vascular congestion Electronically Signed   By: Donavan Foil M.D.   On: 01/15/2017 04:02   Dg Chest Port 1 View  Result Date: 01/14/2017 CLINICAL DATA:  Followup pneumonia EXAM: PORTABLE CHEST 1 VIEW COMPARISON:  01/13/2017 FINDINGS: Marked radiographic worsening with subtotal collapse of the right lung due to consolidation, pleural fluid and volume loss. Mediastinal shift towards the right. Patchy infiltrate persists at the left lung base.  Left upper lung as well aerated. IMPRESSION: Worsened infiltrate, effusion collapse of the right lung. Electronically Signed   By: Nelson Chimes M.D.   On: 01/14/2017 06:50    ASSESSMENT / PLAN:  Acute hypoxic respiratory failure in setting of Right PNA / Suspected Aspiration Pulmonary edema vs ALI c/b mucous plugging and right sided atelectasis/collapse  -Remote Pulmonary emboli (2011 or 2012) -completed 7d unasyn .  -right sided [place] eval 7/12 NO effusion.  -not tolerating BIPAP (per RT note).  -PCXR (7/13 personally reviewed). Remarkable improvement w/ recruitment of right side. Decreased airspace disease on left. Still some residual right basilar consolidation but remarkable improvement when c/w day prior.  -afebrile -O2 requirements:  Plan:   Cont brovana and pulmicort Cont 3% saline nebs Cont chest vest Mobilize (this is Key as deconditioning is his largest barrier) Have family bring home BIPAP mask in  Will back lasix down to daily (cr bumped)   GIB w/ acute blood loss anemia d/t esophagitis, vascular ectasias & supra therapeutic INR  ->EGD completed 7/9  ->required 4 units PRBC, 2 unit FFP since admit ->no evidence of re-bleed  Plan:  Cont PPI BID (8-12 weeks) Adv diet   A. Fib - CHADS VASC 4 Chronic diastolic CHF Plan:  Tele Holding ac (will resume after 8 weeks of PPI)   CKD4 w/ acute on chronic renal failure  Cr bumped w/ escalated diuresis  Plan:   Decrease lasix dosing to daily May need to hold/stop depending on cr on 7/14     FAMILY  - Updates: Patient updated at bedside on plan of care. Discussed a/c as well as goals of care w/ patient and his wife. He is DNR. - We will resume AC at 8 weeks. He understands 1) he is at increased stroke risk during this time and 2) that when he resumes [facility] he will be at increased risk for bleeding. He also states that having the life style barriers that come with stroke would not be acceptable to him so he is willing  to take the risk and re-trial [facility] after his stomach has healed.  We will s/o -cont aggressive pulm hygiene -get home BIPAP mask -re-assess lasix needs daily -PT to mobilize-->suspect will need SNF for rehab -Full DNR -resume AC 8 weeks.   My time 11 minutes  Erick Colace ACNP-BC [place] Pager # [phone] OR # [phone] if no answer  Attending Note:  I  have examined patient, reviewed labs, studies and notes. I have discussed the case with Jerrye Bushy, and I agree with the data and plans as amended above. Pleasant 81 yo man with new GIB on anticoagulation for A fib. Experienced emesis and R aspiration PNA. Most recently has had severe atx on the R, poor secretion clearance. We increased pulm hygiene on 7/12. Today his resp status is improved. CXR 7/13 shows much better aeration of the R lung. He is awake, interacting although continues to be globally weak. Coarse BS on the R. We will plan to continue aggressive pulm hygiene, mobilize. Based on Gi findings and recs we will continue bid PPI for at least 6 weeks, then decide whether to continue. Hold his anticoagulation for 6 weeks and then decide about restarting if no other evidence GI blood loss. Discussions undertaken with the patient and his wife today regarding goals for care. He would want to restart anticoagulation for CVA prevention if feasible. He is clear that he would not want CPR, invasive life sustaining maneuvers including MV. We will place these orders in his chart.  Independent critical care time is 35 minutes.   We will ask [facility] to resume his care as of 7/14. Call if we can help.   Baltazar Apo, MD, PhD 01/15/2017, 9:53 AM [facility] [phone] or if no answer [phone]

## 2017-01-15 NOTE — Progress Notes (Signed)
Pt. placed on BiPAP for h/s with his own nasal mask from home, humidity filled, oxygen initially placed @ 8lpm with 93% sats, plan is to titrate to keep sats >92%, tolerating settings well, RT to monitor.

## 2017-01-15 NOTE — Addendum Note (Signed)
Addendum  created 01/15/17 1254 by Janeece Riggers, MD   Anesthesia Attestations filed

## 2017-01-16 DIAGNOSIS — E440 Moderate protein-calorie malnutrition: Secondary | ICD-10-CM | POA: Insufficient documentation

## 2017-01-16 DIAGNOSIS — S27309S Unspecified injury of lung, unspecified, sequela: Secondary | ICD-10-CM

## 2017-01-16 DIAGNOSIS — J9621 Acute and chronic respiratory failure with hypoxia: Secondary | ICD-10-CM

## 2017-01-16 LAB — BASIC METABOLIC PANEL
Anion gap: 11 (ref 5–15)
BUN: 27 mg/dL — ABNORMAL HIGH (ref 6–20)
CO2: 36 mmol/L — ABNORMAL HIGH (ref 22–32)
Calcium: 8.7 mg/dL — ABNORMAL LOW (ref 8.9–10.3)
Chloride: 94 mmol/L — ABNORMAL LOW (ref 101–111)
Creatinine, Ser: 1.43 mg/dL — ABNORMAL HIGH (ref 0.61–1.24)
GFR calc Af Amer: 52 mL/min — ABNORMAL LOW (ref >60)
GFR calc non Af Amer: 45 mL/min — ABNORMAL LOW (ref >60)
Glucose, Bld: 106 mg/dL — ABNORMAL HIGH (ref 65–99)
Potassium: 3.8 mmol/L (ref 3.5–5.1)
Sodium: 141 mmol/L (ref 135–145)

## 2017-01-16 LAB — CBC
HCT: 32.1 % — ABNORMAL LOW (ref 39.0–52.0)
Hemoglobin: 9.8 g/dL — ABNORMAL LOW (ref 13.0–17.0)
MCH: 24.4 pg — AB (ref 26.0–34.0)
MCHC: 30.5 g/dL (ref 30.0–36.0)
MCV: 79.9 fL (ref 78.0–100.0)
PLATELETS: 187 10*3/uL (ref 150–400)
RBC: 4.02 MIL/uL — ABNORMAL LOW (ref 4.22–5.81)
RDW: 19.7 % — ABNORMAL HIGH (ref 11.5–15.5)
WBC: 8.6 10*3/uL (ref 4.0–10.5)

## 2017-01-16 LAB — PROTIME-INR
INR: 1.7
Prothrombin Time: 20.2 seconds — ABNORMAL HIGH (ref 11.4–15.2)

## 2017-01-16 MED ORDER — IPRATROPIUM-ALBUTEROL 0.5-2.5 (3) MG/3ML IN SOLN
3.0000 mL | RESPIRATORY_TRACT | Status: DC
  Administered 2017-01-16 – 2017-01-17 (×9): 3 mL via RESPIRATORY_TRACT
  Filled 2017-01-16 (×9): qty 3

## 2017-01-16 MED ORDER — ORAL CARE MOUTH RINSE
15.0000 mL | Freq: Two times a day (BID) | OROMUCOSAL | Status: DC
  Administered 2017-01-17 – 2017-01-22 (×6): 15 mL via OROMUCOSAL

## 2017-01-16 NOTE — Progress Notes (Signed)
Pt oxygen saturations not maintaining. Reading as low as 75% while patient is on 10L [place]. RN notified RT. RT advised RN to place pt on NRB mask. Pt does appear to be sleeping and mouth breathing while oxygen desaturating while on [place]. Pulse ox, replaced and placed on pt forehead. Pt oxygen saturation reading 96% on NRB. Will continue to monitor.

## 2017-01-16 NOTE — Progress Notes (Signed)
Pt. wanted BiPAP nasal mask off, RN place pt. back on HFNC([place]), tolerating well, RT to monitor.

## 2017-01-16 NOTE — Progress Notes (Signed)
PROGRESS NOTE                                                                                                                                                                                                             Patient Demographics:    Gary Boyer, is a 81 y.o. male, DOB - Oct 17, 1935, BTD:[id number]  Admit date - 01/06/2017   Admitting Physician Lavina Hamman, MD  Outpatient Primary MD for the patient is [name], MD  LOS - 10  Outpatient Specialists:None  Chief Complaint  Patient presents with  . Emesis       Brief Narrative   81 year old male with history of A. fib, PE on Coumadin, chronic CHF, COPD on home O2 presented to [facility] long EDD with coffee-ground emesis restarted on the night prior to admission. Patient was recently treated with penicillin and doxycycline for UTI. On admission his hemoglobin was 7 with positive FOBT and he was hypotensive. His INR was supratherapeutic. Patient received a total of 4 unit PRBC and 2 units of FFP. He was also in respiratory failure requiring Ventimask and nasal cannula. Patient was scheduled for EGD but chest x-ray done for his dyspnea symptoms showed right-sided opacification which required bronchoscopy on 7/8 with removal of thick mucoid secretion. He then had EGD on 7/9 showing esophagitis in the lower third of esophagus with contact bleeding which stopped spontaneously.. Also had three 4-7 mm bleeding angiodysplastic lesion in the stomach out of which one actively bleeding and was treated followed by hemostatic clips applied.  Patient also was treated for aspiration pneumonia with Unasyn.  Given persistent requirement for high flow nasal cannula and risk for intubation he was monitored under critical care service and transferred back to hospitalist service on 7/14.   Subjective:   Patient still requiring 10 L high flow nasal cannula. Feeling weak. Denies chest  pain, palpitations, further hematemesis or melena.   Assessment  & Plan :    Principal Problem:   Acute upper GI bleed With acute blood loss anemia Secondary to bleeding angiodysplastic gastric lesions and distal esophagitis in the setting of supratherapeutic INR. Received 4 units PRBC and 2 units.. EGD findings of a low. Needs twice daily PPI for at least 8-12 weeks and once daily given large hiatal hernia. GI recommends to delay anticoagulation for at least 5  days and resume if hemoglobin stable and risk versus benefit discussed clearly with patient and his family. Her hemoglobin today. PCCM has discussed with patient and family and recommend resuming anticoagulation after 8 weeks. (Family agree)  Active Problems: Acute respiratory failure with hypoxia Likely combination of acute lung injury, COPD exacerbation and aspiration pneumonia on presentation. Chest x-ray on 7/7 showed complete opacification right hemithorax requiring bronchoscopy and removal of mucous plug. Steroid has been discontinued. Continue chest PT. Completed 7 days of antibiotics (Unasyn). No effusion seen on ultrasound of the chest. Had difficulty tolerating BiPAP. Currently requiring high flow nasal cannula (>10 L). Pulmonary recommends continuing with anion Pulmicort, 3% saline mineralization and increase mobilization. Getting nighttime BiPAP with nasal mask that was brought from home. Encouraged ambulation. Continue Lasix for now. Monitor I/O.  Pulmonary embolism (HCC) Anticoagulation held due to active bleeding. PC CM have discussed with family and recommended resuming anticoagulation after 8 weeks. Although there is high risk for recurrent PE and stroke family understand that the risk of recurrent bleeding outweighs benefit.  Acute on Chronic diastolic CHF Mild acute exacerbation. Continue Lasix for his respiratory symptoms and monitor. Monitor I/O and daily weight.  Chronic kidney disease stage 2-3 Renal function  around baseline (1.5-1.8). Monitor with IV Lasix.        Atrial fibrillation ([place]) Rate controlled. Off antiviral edition. Monitor on telemetry.  Morbid obesity due to excess calories ([place])  Pulmonary embolism ([place])    Malnutrition of moderate degree Dietitian consult.  Chronic depression Continue Zoloft  Insomnia  continue trazodone at bedtime  Aspiration pneumonia of right lower lobe (HCC) Completed 7 days of Unasyn.  COPD exacerbation Received prednisone upon admission initially, discontinued due to active GI bleed. Continue nebs.    1.    Code Status : DO NOT RESUSCITATE  Family Communication  : None at bedside  Disposition Plan  : Needs SNF, continue step down monitoring given active respiratory failure  Barriers For Discharge : Active symptoms  Consults  :   Pulmonary [facility]  Procedures  :  EGD Bronchoscopy  DVT Prophylaxis  :  SCDs  Lab Results  Component Value Date   PLT 160 01/14/2017    Antibiotics  :   Anti-infectives    Start     Dose/Rate Route Frequency Ordered Stop   01/06/17 1200  Ampicillin-Sulbactam (UNASYN) 3 g in sodium chloride 0.9 % 100 mL IVPB     3 g 200 mL/hr over 30 Minutes Intravenous Every 6 hours 01/06/17 1003 01/12/17 2359        Objective:   Vitals:   01/16/17 0900 01/16/17 1000 01/16/17 1141 01/16/17 1154  BP:  (!) 112/57    Pulse:  82    Resp:  (!) 29    Temp: 98.1 F (36.7 C)   98 F (36.7 C)  TempSrc:      SpO2:  95% 91%   Weight:      Height:        Wt Readings from Last 3 Encounters:  01/16/17 124.1 kg (273 lb 9.5 oz)  01/15/15 133 kg (293 lb 3.4 oz)  11/08/12 (!) 139.3 kg (307 lb 1.6 oz)     Intake/Output Summary (Last 24 hours) at 01/16/17 1320 Last data filed at 01/16/17 1053  Gross per 24 hour  Intake              300 ml  Output             2825  ml  Net            -2525 ml     Physical Exam Gen.: Elderly male lying in bed appears fatigued, not in distress HEENT: Moist  mucosa, supple neck, pallor present Chest: Diminished right-sided breath sounds, no added sounds CVS: Normal S1 and S2, no murmurs or gallop GI: Soft, nondistended, nontender Musculoskeletal: Warm, no edema CNS: Alert and oriented    Data Review:    CBC  Recent Labs Lab 01/10/17 0348 01/11/17 0317 01/12/17 0328 01/14/17 0300  WBC 8.1 5.4 6.0 5.8  HGB 8.8* 8.6* 8.7* 8.8*  HCT 30.6* 29.7* 29.1* 29.9*  PLT 184 190 194 160  MCV 83.4 80.7 82.2 82.6  MCH 24.0* 23.4* 24.6* 24.3*  MCHC 28.8* 29.0* 29.9* 29.4*  RDW 19.1* 19.4* 19.9* 19.8*    Chemistries   Recent Labs Lab 01/11/17 0317 01/12/17 0328 01/14/17 0300 01/15/17 0307 01/16/17 0346  NA 142 143 143 140 141  K 4.9 4.5 4.1 4.5 3.8  CL 106 104 101 96* 94*  CO2 30 32 35* 35* 36*  GLUCOSE 124* 95 101* 91 106*  BUN 40* 37* 24* 23* 27*  CREATININE 1.57* 1.37* 1.16 1.34* 1.43*  CALCIUM 8.6* 8.6* 8.3* 8.5* 8.7*   ------------------------------------------------------------------------------------------------------------------ No results for input(s): CHOL, HDL, LDLCALC, TRIG, CHOLHDL, LDLDIRECT in the last 72 hours.  No results found for: HGBA1C ------------------------------------------------------------------------------------------------------------------ No results for input(s): TSH, T4TOTAL, T3FREE, THYROIDAB in the last 72 hours.  Invalid input(s): FREET3 ------------------------------------------------------------------------------------------------------------------ No results for input(s): VITAMINB12, FOLATE, FERRITIN, TIBC, IRON, RETICCTPCT in the last 72 hours.  Coagulation profile  Recent Labs Lab 01/12/17 0328 01/13/17 0307 01/14/17 0300 01/15/17 0307 01/16/17 0346  INR 1.77 2.00 2.16 1.87 1.70    No results for input(s): DDIMER in the last 72 hours.  Cardiac Enzymes No results for input(s): CKMB, TROPONINI, MYOGLOBIN in the last 168 hours.  Invalid input(s):  CK ------------------------------------------------------------------------------------------------------------------    Component Value Date/Time   BNP 306.7 (H) 01/14/2017 0300    Inpatient Medications  Scheduled Meds: . arformoterol  15 mcg Nebulization BID  . budesonide  0.5 mg Nebulization BID  . fentaNYL (SUBLIMAZE) injection  100 mcg Intravenous Once  . folic acid  1 mg Oral Daily  . furosemide  80 mg Intravenous Daily  . ipratropium-albuterol  3 mL Nebulization QID  . lidocaine  1 application Other Once  . multivitamin with minerals  1 tablet Oral Daily  . nystatin  5 mL Oral QID  . pantoprazole  40 mg Oral BID  . protein supplement shake  11 oz Oral Q24H  . sertraline  100 mg Oral QHS  . sodium chloride flush  3 mL Intravenous Q12H  . sodium chloride HYPERTONIC  4 mL Nebulization BID   Continuous Infusions: PRN Meds:.acetaminophen **OR** acetaminophen, ipratropium-albuterol, [DISCONTINUED] ondansetron **OR** ondansetron (ZOFRAN) IV, polyethylene glycol, sodium bicarbonate/sodium chloride, sodium chloride, traZODone  Micro Results Recent Results (from the past 240 hour(s))  Culture, respiratory (NON-Expectorated)     Status: None   Collection Time: 01/10/17  5:30 PM  Result Value Ref Range Status   Specimen Description BRONCHIAL ALVEOLAR LAVAGE RIGHT  Final   Special Requests NONE  Final   Gram Stain   Final    MODERATE WBC PRESENT, PREDOMINANTLY PMN NO ORGANISMS SEEN    Culture   Final    Consistent with normal respiratory flora. Performed at [facility], [place] [address], [place], [place] [address]    Report Status 01/13/2017 FINAL  Final    Radiology Reports Ct Chest Wo Contrast  Result Date: 01/06/2017 CLINICAL DATA:  81 y.o. male with Past medical history of A. fib, PE, chronic warfarin use, chronic CHF, chronic respiratory failure with hypoxia, COPD. Patient presents with complaints of coffee-ground emesis started overnight. Has poor appetite and an  unintentional weight loss. EXAM: CT CHEST WITHOUT CONTRAST TECHNIQUE: Multidetector CT imaging of the chest was performed following the standard protocol without IV contrast. COMPARISON:  CT of the abdomen and pelvis 01/09/2014 and CT of the chest 10/01/2010 FINDINGS: Cardiovascular: The heart is enlarged. There is extensive coronary artery calcification. No pericardial effusion. Significant atherosclerosis of the thoracic aorta. Aortic arch is atherosclerotic. Main pulmonary artery is prominent, main pulmonary artery is enlarged, measuring 4.1 cm. Mediastinum/Nodes: The visualized portion of the thyroid gland has a normal appearance. Large hiatal hernia. Lungs/Pleura: Right pleural effusion and right lower lobe consolidation. There is vascular crowding at both lung bases. There are pleural calcifications. Mild airspace filling is identified within the right upper lobe. Cavitary lesion identified in the left lung apex measuring 1.6 by 4.1 cm. This is smaller compared to 2012 There are paraseptal emphysematous changes in the lung apices bilaterally. Upper Abdomen: There is cirrhotic contour of the liver. Spleen appears enlarged but is only partially imaged. Indeterminate mass is identified in the midpole region of the left kidney, measuring 4.1 cm. There are intrarenal calcifications bilaterally. Adrenal glands are normal in appearance. Musculoskeletal: Degenerative changes are seen in the thoracic spine. IMPRESSION: 1. Cardiomegaly, coronary artery disease and thoracic atherosclerosis. 2. Enlarged pulmonary artery, consistent with pulmonary arterial hypertension. 3. Right pleural effusion. Airspace filling opacities, consistent with infectious process in the right lower lobe and upper lobes. 4. Pleural calcifications, consistent with asbestos exposure. 5. Cavitary lesion in the left upper lobe. 6. Paraseptal emphysema. 7. Large hiatal hernia. 8. Cirrhosis of the liver. 9. Suspect splenomegaly and portal venous  hypertension. 10. Left renal mass. Based on previous imaging, this likely represented a cyst but by today's imaging is indeterminate. Consider renal ultrasound for further characterization. 11. Bilateral intrarenal calculi. Aortic Atherosclerosis (ICD10-I70.0) and Emphysema (ICD10-J43.9). Electronically Signed   By: Nolon Nations M.D.   On: 01/06/2017 16:23   Dg Chest Port 1 View  Result Date: 01/15/2017 CLINICAL DATA:  Pneumonia, history of asthma EXAM: PORTABLE CHEST 1 VIEW COMPARISON:  01/14/2017 and prior FINDINGS: Improving aeration bilaterally. Residual bilateral effusions, small on the left and moderate on the right. Bibasilar consolidations. Cardiomegaly with central vascular congestion but decreased compared to prior. IMPRESSION: 1. Improved aeration compared to most recent prior radiograph. 2. Bilateral effusions, small on the left and moderate on the right with bibasilar consolidations 3. Cardiomegaly with central vascular congestion Electronically Signed   By: Donavan Foil M.D.   On: 01/15/2017 04:02   Dg Chest Port 1 View  Result Date: 01/14/2017 CLINICAL DATA:  Followup pneumonia EXAM: PORTABLE CHEST 1 VIEW COMPARISON:  01/13/2017 FINDINGS: Marked radiographic worsening with subtotal collapse of the right lung due to consolidation, pleural fluid and volume loss. Mediastinal shift towards the right. Patchy infiltrate persists at the left lung base. Left upper lung as well aerated. IMPRESSION: Worsened infiltrate, effusion collapse of the right lung. Electronically Signed   By: Nelson Chimes M.D.   On: 01/14/2017 06:50   Dg Chest Port 1 View  Result Date: 01/13/2017 CLINICAL DATA:  Respiratory failure, hypoxia. EXAM: PORTABLE CHEST 1 VIEW COMPARISON:  Radiograph of January 12, 2017. FINDINGS: Stable cardiomegaly and central pulmonary  vascular congestion. No pneumothorax is noted. Degenerative changes are seen involving both shoulders. Stable bilateral perihilar and basilar densities are noted  concerning for edema or atelectasis with mild associated pleural effusions. Bony thorax is unremarkable. IMPRESSION: Stable bilateral lung opacities are noted concerning for edema or atelectasis with associated pleural effusions. Electronically Signed   By: Marijo Conception, M.D.   On: 01/13/2017 07:22   Dg Chest Port 1 View  Result Date: 01/12/2017 CLINICAL DATA:  Respiratory failure EXAM: PORTABLE CHEST 1 VIEW COMPARISON:  January 10, 2017 FINDINGS: There is a persistent right pleural effusion with consolidation in the right upper lobe and right base regions. There is mild left base atelectasis. Heart is mildly enlarged with pulmonary vascularity within normal limits. There is aortic atherosclerosis. No adenopathy. There is arthropathy in each shoulder. IMPRESSION: Persistent right pleural effusion with areas of consolidation on the right, unchanged. There is stable left base atelectasis. Overall no new opacity. Stable cardiac prominence. There is aortic atherosclerosis. Aortic Atherosclerosis (ICD10-I70.0). Electronically Signed   By: Lowella Grip III M.D.   On: 01/12/2017 07:09   Dg Chest Port 1 View  Result Date: 01/10/2017 CLINICAL DATA:  Atelectasis.  Shortness of breath EXAM: PORTABLE CHEST 1 VIEW COMPARISON:  Study obtained earlier in the day and January 09, 2017 FINDINGS: There is stable consolidation in the right upper lobe and right base regions. There is a small right pleural effusion. There is also patchy atelectasis in the left base. No new opacity compared earlier in the day. Heart is mildly enlarged with pulmonary vascularity within normal limits. No evident adenopathy. There is arthropathy in each shoulder. IMPRESSION: Areas of consolidation and volume loss on the right. Small right pleural effusion. Patchy atelectasis left base. No new opacity compared to earlier in the day. Stable cardiac silhouette. Electronically Signed   By: Lowella Grip III M.D.   On: 01/10/2017 17:51   Dg Chest  Port 1 View  Result Date: 01/10/2017 CLINICAL DATA:  Atelectasis.  Shortness breath. EXAM: PORTABLE CHEST 1 VIEW COMPARISON:  01/09/2017 FINDINGS: The left costophrenic angle is not included. Technologist note indicates: "ordering MD did not needed the image to be repeated to include costophrenic angles." There is significantly improved aeration in the right hemithorax. Continued volume loss on the right with shift of cardiac and mediastinal structures to the right. This is difficult to quantify given the rightward rotation of the chest during imaging. Continued obscuration of the right hemidiaphragm possibly from layering effusion, atelectasis, or pneumonia. Poor definition of the right mediastinal margin. Vague densities at the left lung base. Indistinct pulmonary vasculature. Potential enlargement of the cardiopericardial silhouette. Considerable degenerative arthropathy of the right glenohumeral joint. Reduced acromiohumeral distance on the right suspicious for chronic rotator cuff tear. IMPRESSION: 1. Interval improved aeration in the right hemithorax, but with continued considerable airspace opacity especially at the right lung base and adjacent to the right mediastinum. Cannot exclude layering right pleural effusion. 2. Indistinct airspace opacity in left lower lobe, potentially from edema or pneumonia. 3. Suspected cardiomegaly. Indistinct pulmonary vasculature suggesting pulmonary venous hypertension and potentially mild interstitial edema. 4. Chronic right rotator cuff tear with advanced degenerative glenohumeral arthropathy on the right. Electronically Signed   By: Van Clines M.D.   On: 01/10/2017 17:20   Dg Chest Port 1 View  Result Date: 01/09/2017 CLINICAL DATA:  Worsening shortness of breath. Question aspiration pneumonia. EXAM: PORTABLE CHEST 1 VIEW COMPARISON:  January 06, 2017 FINDINGS: There is complete opacification of the  right hemithorax which is worsened in the interval. There is  volume loss on the right with shift of the heart mediastinum. Diffuse interstitial opacities in the left lung have worsened. No other interval changes. No pneumothorax. IMPRESSION: 1. Complete opacification of the right hemithorax has worsened in the interval. Recommend clinical correlation and follow-up to resolution. 2. Diffuse interstitial opacities in the left lung are worse in the interval most consistent with edema. Electronically Signed   By: Dorise Bullion III M.D   On: 01/09/2017 09:37   Dg Abd Acute W/chest  Result Date: 01/06/2017 CLINICAL DATA:  Patient with back pain. Coffee-ground emesis. Shortness of breath. EXAM: DG ABDOMEN ACUTE W/ 1V CHEST COMPARISON:  Chest radiograph 08/21/2015. FINDINGS: Patient is mildly rotated to the right. Stable enlarged cardiac and mediastinal contours. Hiatal hernia. There is a large area of consolidation throughout the right mid and lower lung. Left lung is clear. Possible small right pleural effusion. No pneumothorax. Gas is demonstrated within nondilated loops of large and small bowel in a nonobstructed pattern. No evidence for free intraperitoneal air. Marked degenerative changes of the lumbar spine. IMPRESSION: Nonobstructed bowel gas pattern. Patchy consolidation within the right mid and lower lung which may represent combination of atelectasis and scarring within the right lung base. Superimposed infectious process not excluded. Recommend further evaluation with chest CT to exclude underlying pulmonary mass. Electronically Signed   By: Lovey Newcomer M.D.   On: 01/06/2017 09:09    Time Spent in minutes  35   Louellen Molder M.D on 01/16/2017 at 1:20 PM  Between 7am to 7pm - Pager - [phone]  After 7pm go to [email or web address] - password [facility]  [facility] -  Office  [phone]

## 2017-01-17 LAB — BASIC METABOLIC PANEL
Anion gap: 8 (ref 5–15)
BUN: 28 mg/dL — AB (ref 6–20)
CALCIUM: 8.3 mg/dL — AB (ref 8.9–10.3)
CO2: 36 mmol/L — ABNORMAL HIGH (ref 22–32)
CREATININE: 1.55 mg/dL — AB (ref 0.61–1.24)
Chloride: 95 mmol/L — ABNORMAL LOW (ref 101–111)
GFR calc Af Amer: 47 mL/min — ABNORMAL LOW (ref >60)
GFR, EST NON AFRICAN AMERICAN: 41 mL/min — AB (ref >60)
GLUCOSE: 115 mg/dL — AB (ref 65–99)
Potassium: 3.7 mmol/L (ref 3.5–5.1)
SODIUM: 139 mmol/L (ref 135–145)

## 2017-01-17 LAB — PROTIME-INR
INR: 1.54
PROTHROMBIN TIME: 18.6 s — AB (ref 11.4–15.2)

## 2017-01-17 LAB — PROCALCITONIN: PROCALCITONIN: 0.23 ng/mL

## 2017-01-17 NOTE — Progress Notes (Signed)
PROGRESS NOTE                                                                                                                                                                                                             Patient Demographics:    Gary Boyer, is a 81 y.o. male, DOB - 04-17-1936, ZGY:[id number]  Admit date - 01/06/2017   Admitting Physician Lavina Hamman, MD  Outpatient Primary MD for the patient is Gary Saunas, MD  LOS - 11  Outpatient Specialists:None  Chief Complaint  Patient presents with  . Emesis       Brief Narrative   81 year old male with history of A. fib, PE on Coumadin, chronic CHF, COPD on home O2 presented to [facility] long EDD with coffee-ground emesis restarted on the night prior to admission. Patient was recently treated with penicillin and doxycycline for UTI. On admission his hemoglobin was 7 with positive FOBT and he was hypotensive. His INR was supratherapeutic. Patient received a total of 4 unit PRBC and 2 units of FFP. He was also in respiratory failure requiring Ventimask and nasal cannula. Patient was scheduled for EGD but chest x-ray done for his dyspnea symptoms showed right-sided opacification which required bronchoscopy on 7/8 with removal of thick mucoid secretion. He then had EGD on 7/9 showing esophagitis in the lower third of esophagus with contact bleeding which stopped spontaneously.. Also had three 4-7 mm bleeding angiodysplastic lesion in the stomach out of which one actively bleeding and was treated followed by hemostatic clips applied.  Patient also was treated for aspiration pneumonia with Unasyn.  Given persistent requirement for high flow nasal cannula and risk for intubation he was monitored under critical care service and transferred back to hospitalist service on 7/14.   Subjective:   Did not tolerate BiPAP last night and was pulling it out. Sats were maintained on  NRB all night. Currently on 10 L high flow nasal cannula. Feels his breathing to be getting better.   Assessment  & Plan :    Principal Problem: Acute respiratory failure with hypoxia Likely combination of acute lung injury, COPD exacerbation and aspiration pneumonia on presentation. Chest x-ray on 7/7 showed complete opacification right hemithorax requiring bronchoscopy and removal of mucous plug. Steroid has been discontinued. Continue chest PT.  Completed 7 days of antibiotics (Unasyn). No  effusion seen on ultrasound of the chest. Not been tolerating BiPAP well. Still requiring 10 L high flow nasal cannula. Pulmonary recommends continuing with twice a day Pulmicort and Brovana nebs, 3% saline nebulization and increase mobilization. Has not been tolerating BiPAP with nasal mask brought from home as well. Continue Lasix for diuresis.  Will consult pulmonary back tomorrow if symptoms unimproved.    Active Problems:    Acute upper GI bleed With acute blood loss anemia Secondary to bleeding angiodysplastic gastric lesions and distal esophagitis in the setting of supratherapeutic INR. Received 4 units PRBC and 2 units.. EGD findings as above. Needs twice daily PPI for at least 8-12 weeks and once daily given large hiatal hernia. GI recommends to delay anticoagulation for at least 5 days and resume if hemoglobin stable and risk versus benefit discussed clearly with patient and his family.  PCCM has discussed with patient and family and recommend resuming anticoagulation after 8 weeks. (Family agree). Hemoglobin currently stable at 9.8.  Pulmonary embolism (HCC) Anticoagulation held due to active bleeding. PCCM have discussed with family and recommended resuming anticoagulation after 8 weeks. Although there is high risk for recurrent PE and stroke family understand that the risk of recurrent bleeding outweighs benefit.  Acute on Chronic diastolic CHF Mild acute exacerbation. Continue Lasix for  his respiratory symptoms and monitor. Has good urine output. (Negative balance 8.2 L since admission).  Chronic kidney disease stage 2-3 Renal function at baseline (1.5-1.8). Monitor with IV Lasix.     Atrial fibrillation ([place]) Rate controlled. Off antiviral edition. Monitor on telemetry.  Morbid obesity due to excess calories ([place])      Malnutrition of moderate degree Dietitian consult.  Chronic depression Continue Zoloft  Insomnia  continue trazodone at bedtime  Aspiration pneumonia of right lower lobe (HCC) Completed 7 days of Unasyn.  COPD exacerbation Received prednisone upon admission initially, discontinued due to active GI bleed. Continue nebs.      Code Status : DO NOT RESUSCITATE  Family Communication  : None at bedside  Disposition Plan  : Needs SNF, continue step down monitoring given active respiratory failure  Barriers For Discharge : Active symptoms  Consults  :   Pulmonary [facility]  Procedures  :  EGD Bronchoscopy  DVT Prophylaxis  :  SCDs  Lab Results  Component Value Date   PLT 187 01/16/2017    Antibiotics  :   Anti-infectives    Start     Dose/Rate Route Frequency Ordered Stop   01/06/17 1200  Ampicillin-Sulbactam (UNASYN) 3 g in sodium chloride 0.9 % 100 mL IVPB     3 g 200 mL/hr over 30 Minutes Intravenous Every 6 hours 01/06/17 1003 01/12/17 2359        Objective:   Vitals:   01/17/17 0500 01/17/17 0600 01/17/17 0712 01/17/17 0812  BP:  (!) 112/58    Pulse: 68 70    Resp: (!) 23 (!) 25    Temp:   98.1 F (36.7 C)   TempSrc:   Axillary   SpO2: 100% 100%  96%  Weight:      Height:        Wt Readings from Last 3 Encounters:  01/17/17 112.2 kg (247 lb 5.7 oz)  01/15/15 133 kg (293 lb 3.4 oz)  11/08/12 (!) 139.3 kg (307 lb 1.6 oz)     Intake/Output Summary (Last 24 hours) at 01/17/17 1018 Last data filed at 01/17/17 0600  Gross per 24 hour  Intake  100 ml  Output             1815 ml  Net             -1715 ml     Physical Exam Gen.: Elderly male in no acute distress still requiring high flow nasal cannula HEENT: Pallor present, Moist mucosa, supple neck Chest: Improved breath sounds bilaterally  CVS: Normal S1 and S2, no murmurs GI: Soft, nondistended, nontender, Foley + Musculoskeletal: Warm, no edema     Data Review:    CBC  Recent Labs Lab 01/11/17 0317 01/12/17 0328 01/14/17 0300 01/16/17 1506  WBC 5.4 6.0 5.8 8.6  HGB 8.6* 8.7* 8.8* 9.8*  HCT 29.7* 29.1* 29.9* 32.1*  PLT 190 194 160 187  MCV 80.7 82.2 82.6 79.9  MCH 23.4* 24.6* 24.3* 24.4*  MCHC 29.0* 29.9* 29.4* 30.5  RDW 19.4* 19.9* 19.8* 19.7*    Chemistries   Recent Labs Lab 01/12/17 0328 01/14/17 0300 01/15/17 0307 01/16/17 0346 01/17/17 0345  NA 143 143 140 141 139  K 4.5 4.1 4.5 3.8 3.7  CL 104 101 96* 94* 95*  CO2 32 35* 35* 36* 36*  GLUCOSE 95 101* 91 106* 115*  BUN 37* 24* 23* 27* 28*  CREATININE 1.37* 1.16 1.34* 1.43* 1.55*  CALCIUM 8.6* 8.3* 8.5* 8.7* 8.3*   ------------------------------------------------------------------------------------------------------------------ No results for input(s): CHOL, HDL, LDLCALC, TRIG, CHOLHDL, LDLDIRECT in the last 72 hours.  No results found for: HGBA1C ------------------------------------------------------------------------------------------------------------------ No results for input(s): TSH, T4TOTAL, T3FREE, THYROIDAB in the last 72 hours.  Invalid input(s): FREET3 ------------------------------------------------------------------------------------------------------------------ No results for input(s): VITAMINB12, FOLATE, FERRITIN, TIBC, IRON, RETICCTPCT in the last 72 hours.  Coagulation profile  Recent Labs Lab 01/13/17 0307 01/14/17 0300 01/15/17 0307 01/16/17 0346 01/17/17 0345  INR 2.00 2.16 1.87 1.70 1.54    No results for input(s): DDIMER in the last 72 hours.  Cardiac Enzymes No results for input(s): CKMB, TROPONINI,  MYOGLOBIN in the last 168 hours.  Invalid input(s): CK ------------------------------------------------------------------------------------------------------------------    Component Value Date/Time   BNP 306.7 (H) 01/14/2017 0300    Inpatient Medications  Scheduled Meds: . arformoterol  15 mcg Nebulization BID  . budesonide  0.5 mg Nebulization BID  . fentaNYL (SUBLIMAZE) injection  100 mcg Intravenous Once  . folic acid  1 mg Oral Daily  . furosemide  80 mg Intravenous Daily  . ipratropium-albuterol  3 mL Nebulization Q4H  . lidocaine  1 application Other Once  . mouth rinse  15 mL Mouth Rinse BID  . multivitamin with minerals  1 tablet Oral Daily  . nystatin  5 mL Oral QID  . pantoprazole  40 mg Oral BID  . protein supplement shake  11 oz Oral Q24H  . sertraline  100 mg Oral QHS  . sodium chloride flush  3 mL Intravenous Q12H   Continuous Infusions: PRN Meds:.acetaminophen **OR** acetaminophen, ipratropium-albuterol, [DISCONTINUED] ondansetron **OR** ondansetron (ZOFRAN) IV, polyethylene glycol, sodium bicarbonate/sodium chloride, sodium chloride, traZODone  Micro Results Recent Results (from the past 240 hour(s))  Culture, respiratory (NON-Expectorated)     Status: None   Collection Time: 01/10/17  5:30 PM  Result Value Ref Range Status   Specimen Description BRONCHIAL ALVEOLAR LAVAGE RIGHT  Final   Special Requests NONE  Final   Gram Stain   Final    MODERATE WBC PRESENT, PREDOMINANTLY PMN NO ORGANISMS SEEN    Culture   Final    Consistent with normal respiratory flora. Performed at [facility],  [address], [place], [place] [address]    Report Status 01/13/2017 FINAL  Final    Radiology Reports Ct Chest Wo Contrast  Result Date: 01/06/2017 CLINICAL DATA:  81 y.o. male with Past medical history of A. fib, PE, chronic warfarin use, chronic CHF, chronic respiratory failure with hypoxia, COPD. Patient presents with complaints of coffee-ground emesis  started overnight. Has poor appetite and an unintentional weight loss. EXAM: CT CHEST WITHOUT CONTRAST TECHNIQUE: Multidetector CT imaging of the chest was performed following the standard protocol without IV contrast. COMPARISON:  CT of the abdomen and pelvis 01/09/2014 and CT of the chest 10/01/2010 FINDINGS: Cardiovascular: The heart is enlarged. There is extensive coronary artery calcification. No pericardial effusion. Significant atherosclerosis of the thoracic aorta. Aortic arch is atherosclerotic. Main pulmonary artery is prominent, main pulmonary artery is enlarged, measuring 4.1 cm. Mediastinum/Nodes: The visualized portion of the thyroid gland has a normal appearance. Large hiatal hernia. Lungs/Pleura: Right pleural effusion and right lower lobe consolidation. There is vascular crowding at both lung bases. There are pleural calcifications. Mild airspace filling is identified within the right upper lobe. Cavitary lesion identified in the left lung apex measuring 1.6 by 4.1 cm. This is smaller compared to 2012 There are paraseptal emphysematous changes in the lung apices bilaterally. Upper Abdomen: There is cirrhotic contour of the liver. Spleen appears enlarged but is only partially imaged. Indeterminate mass is identified in the midpole region of the left kidney, measuring 4.1 cm. There are intrarenal calcifications bilaterally. Adrenal glands are normal in appearance. Musculoskeletal: Degenerative changes are seen in the thoracic spine. IMPRESSION: 1. Cardiomegaly, coronary artery disease and thoracic atherosclerosis. 2. Enlarged pulmonary artery, consistent with pulmonary arterial hypertension. 3. Right pleural effusion. Airspace filling opacities, consistent with infectious process in the right lower lobe and upper lobes. 4. Pleural calcifications, consistent with asbestos exposure. 5. Cavitary lesion in the left upper lobe. 6. Paraseptal emphysema. 7. Large hiatal hernia. 8. Cirrhosis of the liver. 9.  Suspect splenomegaly and portal venous hypertension. 10. Left renal mass. Based on previous imaging, this likely represented a cyst but by today's imaging is indeterminate. Consider renal ultrasound for further characterization. 11. Bilateral intrarenal calculi. Aortic Atherosclerosis (ICD10-I70.0) and Emphysema (ICD10-J43.9). Electronically Signed   By: Nolon Nations M.D.   On: 01/06/2017 16:23   Dg Chest Port 1 View  Result Date: 01/15/2017 CLINICAL DATA:  Pneumonia, history of asthma EXAM: PORTABLE CHEST 1 VIEW COMPARISON:  01/14/2017 and prior FINDINGS: Improving aeration bilaterally. Residual bilateral effusions, small on the left and moderate on the right. Bibasilar consolidations. Cardiomegaly with central vascular congestion but decreased compared to prior. IMPRESSION: 1. Improved aeration compared to most recent prior radiograph. 2. Bilateral effusions, small on the left and moderate on the right with bibasilar consolidations 3. Cardiomegaly with central vascular congestion Electronically Signed   By: Donavan Foil M.D.   On: 01/15/2017 04:02   Dg Chest Port 1 View  Result Date: 01/14/2017 CLINICAL DATA:  Followup pneumonia EXAM: PORTABLE CHEST 1 VIEW COMPARISON:  01/13/2017 FINDINGS: Marked radiographic worsening with subtotal collapse of the right lung due to consolidation, pleural fluid and volume loss. Mediastinal shift towards the right. Patchy infiltrate persists at the left lung base. Left upper lung as well aerated. IMPRESSION: Worsened infiltrate, effusion collapse of the right lung. Electronically Signed   By: Nelson Chimes M.D.   On: 01/14/2017 06:50   Dg Chest Port 1 View  Result Date: 01/13/2017 CLINICAL DATA:  Respiratory failure, hypoxia. EXAM: PORTABLE CHEST  1 VIEW COMPARISON:  Radiograph of January 12, 2017. FINDINGS: Stable cardiomegaly and central pulmonary vascular congestion. No pneumothorax is noted. Degenerative changes are seen involving both shoulders. Stable bilateral  perihilar and basilar densities are noted concerning for edema or atelectasis with mild associated pleural effusions. Bony thorax is unremarkable. IMPRESSION: Stable bilateral lung opacities are noted concerning for edema or atelectasis with associated pleural effusions. Electronically Signed   By: Marijo Conception, M.D.   On: 01/13/2017 07:22   Dg Chest Port 1 View  Result Date: 01/12/2017 CLINICAL DATA:  Respiratory failure EXAM: PORTABLE CHEST 1 VIEW COMPARISON:  January 10, 2017 FINDINGS: There is a persistent right pleural effusion with consolidation in the right upper lobe and right base regions. There is mild left base atelectasis. Heart is mildly enlarged with pulmonary vascularity within normal limits. There is aortic atherosclerosis. No adenopathy. There is arthropathy in each shoulder. IMPRESSION: Persistent right pleural effusion with areas of consolidation on the right, unchanged. There is stable left base atelectasis. Overall no new opacity. Stable cardiac prominence. There is aortic atherosclerosis. Aortic Atherosclerosis (ICD10-I70.0). Electronically Signed   By: Lowella Grip III M.D.   On: 01/12/2017 07:09   Dg Chest Port 1 View  Result Date: 01/10/2017 CLINICAL DATA:  Atelectasis.  Shortness of breath EXAM: PORTABLE CHEST 1 VIEW COMPARISON:  Study obtained earlier in the day and January 09, 2017 FINDINGS: There is stable consolidation in the right upper lobe and right base regions. There is a small right pleural effusion. There is also patchy atelectasis in the left base. No new opacity compared earlier in the day. Heart is mildly enlarged with pulmonary vascularity within normal limits. No evident adenopathy. There is arthropathy in each shoulder. IMPRESSION: Areas of consolidation and volume loss on the right. Small right pleural effusion. Patchy atelectasis left base. No new opacity compared to earlier in the day. Stable cardiac silhouette. Electronically Signed   By: Lowella Grip III  M.D.   On: 01/10/2017 17:51   Dg Chest Port 1 View  Result Date: 01/10/2017 CLINICAL DATA:  Atelectasis.  Shortness breath. EXAM: PORTABLE CHEST 1 VIEW COMPARISON:  01/09/2017 FINDINGS: The left costophrenic angle is not included. Technologist note indicates: "ordering MD did not needed the image to be repeated to include costophrenic angles." There is significantly improved aeration in the right hemithorax. Continued volume loss on the right with shift of cardiac and mediastinal structures to the right. This is difficult to quantify given the rightward rotation of the chest during imaging. Continued obscuration of the right hemidiaphragm possibly from layering effusion, atelectasis, or pneumonia. Poor definition of the right mediastinal margin. Vague densities at the left lung base. Indistinct pulmonary vasculature. Potential enlargement of the cardiopericardial silhouette. Considerable degenerative arthropathy of the right glenohumeral joint. Reduced acromiohumeral distance on the right suspicious for chronic rotator cuff tear. IMPRESSION: 1. Interval improved aeration in the right hemithorax, but with continued considerable airspace opacity especially at the right lung base and adjacent to the right mediastinum. Cannot exclude layering right pleural effusion. 2. Indistinct airspace opacity in left lower lobe, potentially from edema or pneumonia. 3. Suspected cardiomegaly. Indistinct pulmonary vasculature suggesting pulmonary venous hypertension and potentially mild interstitial edema. 4. Chronic right rotator cuff tear with advanced degenerative glenohumeral arthropathy on the right. Electronically Signed   By: Van Clines M.D.   On: 01/10/2017 17:20   Dg Chest Port 1 View  Result Date: 01/09/2017 CLINICAL DATA:  Worsening shortness of breath. Question aspiration pneumonia. EXAM: PORTABLE  CHEST 1 VIEW COMPARISON:  January 06, 2017 FINDINGS: There is complete opacification of the right hemithorax which is  worsened in the interval. There is volume loss on the right with shift of the heart mediastinum. Diffuse interstitial opacities in the left lung have worsened. No other interval changes. No pneumothorax. IMPRESSION: 1. Complete opacification of the right hemithorax has worsened in the interval. Recommend clinical correlation and follow-up to resolution. 2. Diffuse interstitial opacities in the left lung are worse in the interval most consistent with edema. Electronically Signed   By: Dorise Bullion III M.D   On: 01/09/2017 09:37   Dg Abd Acute W/chest  Result Date: 01/06/2017 CLINICAL DATA:  Patient with back pain. Coffee-ground emesis. Shortness of breath. EXAM: DG ABDOMEN ACUTE W/ 1V CHEST COMPARISON:  Chest radiograph 08/21/2015. FINDINGS: Patient is mildly rotated to the right. Stable enlarged cardiac and mediastinal contours. Hiatal hernia. There is a large area of consolidation throughout the right mid and lower lung. Left lung is clear. Possible small right pleural effusion. No pneumothorax. Gas is demonstrated within nondilated loops of large and small bowel in a nonobstructed pattern. No evidence for free intraperitoneal air. Marked degenerative changes of the lumbar spine. IMPRESSION: Nonobstructed bowel gas pattern. Patchy consolidation within the right mid and lower lung which may represent combination of atelectasis and scarring within the right lung base. Superimposed infectious process not excluded. Recommend further evaluation with chest CT to exclude underlying pulmonary mass. Electronically Signed   By: Lovey Newcomer M.D.   On: 01/06/2017 09:09    Time Spent in minutes  35   Louellen Molder M.D on 01/17/2017 at 10:18 AM  Between 7am to 7pm - Pager - [phone]  After 7pm go to [email or web address] - password [facility]  [facility] -  Office  [phone]

## 2017-01-17 NOTE — Procedures (Signed)
Pt did not tolerated bring placed on the BiPAP.  Ask to have mask removed.  RT placed pt on NRB.  Pt tolerating well and resting.  HR-113, RR-26, SpO2-94. RT will continue to monitor pt. RN notified.

## 2017-01-17 NOTE — Progress Notes (Signed)
RT instructed the Pt to cough and to use his flutter.The Pt's SATS increased to 87%. RT placed the Pt on NRB with the flap of and his SATS are 93%. RT will continue to monitor

## 2017-01-18 ENCOUNTER — Inpatient Hospital Stay (HOSPITAL_COMMUNITY): Payer: Medicare Other

## 2017-01-18 DIAGNOSIS — M109 Gout, unspecified: Secondary | ICD-10-CM

## 2017-01-18 LAB — CBC
HEMATOCRIT: 29.5 % — AB (ref 39.0–52.0)
Hemoglobin: 8.9 g/dL — ABNORMAL LOW (ref 13.0–17.0)
MCH: 23.8 pg — AB (ref 26.0–34.0)
MCHC: 30.2 g/dL (ref 30.0–36.0)
MCV: 78.9 fL (ref 78.0–100.0)
PLATELETS: 182 10*3/uL (ref 150–400)
RBC: 3.74 MIL/uL — ABNORMAL LOW (ref 4.22–5.81)
RDW: 19.8 % — AB (ref 11.5–15.5)
WBC: 8.8 10*3/uL (ref 4.0–10.5)

## 2017-01-18 LAB — BASIC METABOLIC PANEL
ANION GAP: 8 (ref 5–15)
BUN: 30 mg/dL — AB (ref 6–20)
CO2: 36 mmol/L — AB (ref 22–32)
Calcium: 8.3 mg/dL — ABNORMAL LOW (ref 8.9–10.3)
Chloride: 93 mmol/L — ABNORMAL LOW (ref 101–111)
Creatinine, Ser: 1.58 mg/dL — ABNORMAL HIGH (ref 0.61–1.24)
GFR calc Af Amer: 46 mL/min — ABNORMAL LOW (ref >60)
GFR, EST NON AFRICAN AMERICAN: 40 mL/min — AB (ref >60)
GLUCOSE: 112 mg/dL — AB (ref 65–99)
POTASSIUM: 3.4 mmol/L — AB (ref 3.5–5.1)
Sodium: 137 mmol/L (ref 135–145)

## 2017-01-18 LAB — PROTIME-INR
INR: 1.55
Prothrombin Time: 18.7 seconds — ABNORMAL HIGH (ref 11.4–15.2)

## 2017-01-18 MED ORDER — SODIUM CHLORIDE 3 % IN NEBU
4.0000 mL | INHALATION_SOLUTION | Freq: Every day | RESPIRATORY_TRACT | Status: DC
  Administered 2017-01-19 – 2017-01-20 (×2): 4 mL via RESPIRATORY_TRACT
  Filled 2017-01-18 (×3): qty 4

## 2017-01-18 MED ORDER — IPRATROPIUM-ALBUTEROL 0.5-2.5 (3) MG/3ML IN SOLN
3.0000 mL | Freq: Four times a day (QID) | RESPIRATORY_TRACT | Status: DC
  Administered 2017-01-18 – 2017-01-22 (×18): 3 mL via RESPIRATORY_TRACT
  Filled 2017-01-18 (×18): qty 3

## 2017-01-18 MED ORDER — POTASSIUM CHLORIDE CRYS ER 20 MEQ PO TBCR
40.0000 meq | EXTENDED_RELEASE_TABLET | Freq: Once | ORAL | Status: AC
  Administered 2017-01-18: 40 meq via ORAL
  Filled 2017-01-18: qty 2

## 2017-01-18 MED ORDER — METHYLPREDNISOLONE SODIUM SUCC 40 MG IJ SOLR
40.0000 mg | Freq: Two times a day (BID) | INTRAMUSCULAR | Status: DC
  Administered 2017-01-18 (×2): 40 mg via INTRAVENOUS
  Filled 2017-01-18 (×2): qty 1

## 2017-01-18 NOTE — Progress Notes (Signed)
Pt. agreed to be placed on BiPAP for h/s with his M/Nasal mask, humidifier filled, oxygen added to circuit @ 8 lpm to achieve sats >/=92%, RT to monitor.

## 2017-01-18 NOTE — Progress Notes (Signed)
RT NOTE:  Pt tried BIPAP tonight and didn't not tolerate. Pt complains the pressure and mask are very uncomfortable to him. Pt SpO2 decreased with BIPAP to 87% with 8L O2 bled in. RT returned patient to PRB and SpO2 increased to 97%

## 2017-01-18 NOTE — Consult Note (Signed)
Modified Barium Swallow Progress Note  Patient Details  Name: Giacomo Valone MRN: [id number] Date of Birth: 13-Apr-1936  Today's Date: 01/18/2017  Modified Barium Swallow completed.  Full report located under Chart Review in the Imaging Section.  Brief recommendations include the following:  Clinical Impression Pt presents with normal oral, mild pharyngeal dysphagia, characterized primarily by delayed swallow reflex - trigger noted at the pyriform sinus on thin liquids, and at the vallecula on other consistencies. Penetration of thin liquids was noted inconsistently, and primarily with large consecutive boluses. No penetration was noted on small individual sips. The barium tablet was given with water, and was noted to pause in the vallecula, requiring additional bolus of water to clear.  Will recommend soft diet and chopped meats, thin liquids (SMALL boluses) and pills one at a time with liquid. Safe swallow precautions posted at [facility] and reviewed with pt/wife. ST will continue to follow to assess diet tolerance and complete education.    Swallow Evaluation Recommendations  SLP Diet Recommendations: Dysphagia 3 (Mech soft) solids;Thin liquid (chop meats)   Liquid Administration via: Straw;Cup   Medication Administration: Whole meds with liquid (1 at a time)   Supervision: Staff to assist with self feeding;Full supervision/cueing for compensatory strategies   Compensations: Minimize environmental distractions;Slow rate;Small sips/bites   Postural Changes: Remain semi-upright after after feeds/meals (Comment);Seated upright at 90 degrees   Oral Care Recommendations: Oral care BID      Arrie Borrelli B. [place], [facility], [place]  Shonna Chock 01/18/2017,4:01 PM

## 2017-01-18 NOTE — Progress Notes (Signed)
Date:  January 18, 2017 Chart reviewed for concurrent status and case management needs. Will continue to follow patient progress.Gary Boyer on PRB face mask at 50% Fi02 Discharge Planning: following for needs Expected discharge date: [id number] Gary Boyer, BSN, [name], [name]

## 2017-01-18 NOTE — Progress Notes (Signed)
PULMONARY / CRITICAL CARE MEDICINE   Name: Gary Boyer MRN:   [id number] DOB:   [birth year]             ADMISSION DATE:  01/06/2017 CONSULTATION DATE:  01/10/2017  REFERRING MD:  Dr. Rogers Boyer  CHIEF COMPLAINT:  Chest x-ray abnormality  BRIEF SUMMARY:  81 y/o M admitted 7/4 with coffee ground emesis.  Pt was pending an EGD.  He had a CXR that showed R sided opacification which prompted pulmonary evaluation.  He improved with chest PT and mucomyst.  Underwent bronchoscopy on 7/8 with removal of thick mucoid secretions.    PMH - A. fib, PE on Coumadin, chronic CHF.   SUBJECTIVE:   Feels better. Did not tolerate the BIPAP mask last night. Seems like it was a fit issue as well as a pressure issue. Objective   BP 132/70 (BP Location: Left Wrist)   Pulse 81   Temp 99.9 F (37.7 C) (Axillary)   Resp (!) 29   Ht 6' (1.829 m)   Wt 240 lb 8.4 oz (109.1 kg)   SpO2 96%   BMI 32.62 kg/m   Intake/Output Summary (Last 24 hours) at 01/18/17 0940 Last data filed at 01/18/17 0901  Gross per 24 hour  Intake              253 ml  Output             2215 ml  Net            -1962 ml  12 liters HFNC   Physical Exam  Constitutional: He is oriented to person, place, and time. He appears cachectic.  Non-toxic appearance. He does not have a sickly appearance.  Eyes: Pupils are equal, round, and reactive to light. Conjunctivae and EOM are normal.  Cardiovascular: Normal pulses.  A regularly irregular rhythm present.  LE edema   Pulmonary/Chest: Effort normal. No accessory muscle usage. No respiratory distress. He has decreased breath sounds in the right middle field and the right lower field.  Abdominal: Soft. Normal appearance and bowel sounds are normal.  Musculoskeletal:       Right shoulder: He exhibits decreased range of motion and tenderness.       Left shoulder: He exhibits decreased range of motion and tenderness.       Right wrist: He exhibits decreased range of motion and tenderness.        Left wrist: He exhibits decreased range of motion and swelling.  Neurological: He is oriented to person, place, and time.  Skin: Skin is warm and dry. Capillary refill takes less than 2 seconds.  Psychiatric: He has a normal mood and affect.     CBC Recent Labs     01/16/17  1506  01/18/17  0329  WBC  8.6  8.8  HGB  9.8*  8.9*  HCT  32.1*  29.5*  PLT  187  182    Coag's Recent Labs     01/16/17  0346  01/17/17  0345  01/18/17  0329  INR  1.70  1.54  1.55    BMET Recent Labs     01/16/17  0346  01/17/17  0345  01/18/17  0329  NA  141  139  137  K  3.8  3.7  3.4*  CL  94*  95*  93*  CO2  36*  36*  36*  BUN  27*  28*  30*  CREATININE  1.43*  1.55*  1.58*  GLUCOSE  106*  115*  112*    Electrolytes Recent Labs     01/16/17  0346  01/17/17  0345  01/18/17  0329  CALCIUM  8.7*  8.3*  8.3*    Sepsis Markers Recent Labs     01/17/17  0537  PROCALCITON  0.23    ABG No results for input(s): PHART, PCO2ART, PO2ART in the last 72 hours.  Liver Enzymes No results for input(s): AST, ALT, ALKPHOS, BILITOT, ALBUMIN in the last 72 hours.  Cardiac Enzymes No results for input(s): TROPONINI, PROBNP in the last 72 hours.  Glucose No results for input(s): GLUCAP in the last 72 hours.  Imaging No results found.  ASSESSMENT / PLAN:  Acute hypoxic respiratory failure in setting recurrent mucous plugging and right sided atelectasis/collapse  -Remote Pulmonary emboli (2011 or 2012) -r/o dysphagia/aspiration  -completed 7d unasyn .  -not tolerating BIPAP (per RT note).  -low grade fever; WBC ct flat, PCT neg pcxr (personally reviewed)-->again has progressive consolidative RLL atelectasis. This is not as bad as it was on 12th but worse in c/w 13th). -I continue to believe that his over-all deconditioning, pain, poor cough mechanics, and nutritional status w/ really what equates to failure to thrive is the etiology of his recurrent decline.  Plan:   Cont  high-flow oxygen  Needs chest vest NOT bed PT Cont duoneb, brovana/budesonide Get sputum culture but don't think we need to add back abx Add back daily hypertonic saline neb May need NTS if not able to cough  Agree w/ swallow eval OOB Considering risk/benefit and fact that gout pain contributing to decreased ROM and essentially cough mechanics and pulmonary hygiene we will treat gout flare. If he doesn't improve we may be looking at transition to palliative   GIB w/ acute blood loss anemia d/t esophagitis, vascular ectasias & supra therapeutic INR  ->EGD completed 7/9  ->required 4 units PRBC, 2 unit FFP since admit ->no evidence of re-bleed ? Dysphagia  Plan:  Cont PPI Swallow eval pending  Diet as tolerated for now  Gout flare  Plan Given risk benefit will place him on steroids (he has been on PPI for several days now) Hoping this will improve his mobility and hence pulmonary hygiene tolerance   A. Fib - CHADS VASC 4 Chronic diastolic CHF Plan:  Tele Holding ac for total of 8 weeks of PPI given recent UGIB  We extensively discussed risk/benefit here    CKD4 w/ acute on chronic renal failure  Cr stable ~ 1.5 Plan:   Cont daily lasix as BUN/cr and BP allow   I think Gary Boyer is up against a wall here. His deconditioning, pain and poor activity tolerance is resulting in continued recurrent RLL collapse and atx. I do think that his gout is a major barrier. He can't even lift his arms off bed. He may also be aspirating. Given the risk/benefit here I think it is worth treating his gout w/ steroids (I discussed this with him). If we don't get him mobilized he will not survive. He is not a candidate for FOB for 2 reasons 1) he is deconditioned and will be prone to recurrent collapse even if we bronch him now and 2) he is on high FIO2 requirements. If he does not improve in the next few days I fear we will be looking at transitioning to palliative. I will discuss this with him and  his wife.  Thank-you for re-involving [place] in his care.   Gary Boyer  [facility] ACNP-BC [place] Pager # [phone] OR # [phone] if no answer

## 2017-01-18 NOTE — Consult Note (Signed)
Speech Pathology Consult Note  Orders received for swallow evaluation, with concern for silent aspiration. Pt screened at bedside. He reports no history of swallowing difficulty, and exhibits no overt s/s aspiration with thin liquids. Will defer formal bedside swallow evaluation and proceed to objective study (MBS) to rule out silent aspiration, as it is undetectable at bedside.  Celia B. [place], [facility], [place]

## 2017-01-18 NOTE — Progress Notes (Signed)
Physical Therapy Treatment Patient Details Name: Gary Boyer MRN: [id number] DOB: 13-Jul-1935 Today's Date: 01/18/2017    History of Present Illness Pt admitted from home with upper GIB/anemia and hx of COPD, LE edema, PE, L TKR and falls.  Pt also with acute hypoxic respiratory failure in setting recurrent mucous plugging and right sided atelectasis/collapse     PT Comments    Pt up in recliner on arrival.  Pt performed upright trunk posture at edge of chair a few times and performed a couple exercises.  Pt unable to assist with standing today.  Follow Up Recommendations  SNF     Equipment Recommendations  None recommended by PT    Recommendations for Other Services       Precautions / Restrictions Precautions Precautions: Fall    Mobility  Bed Mobility               General bed mobility comments: pt up in recliner with maxi sky lift pad behind him  Transfers Overall transfer level: Needs assistance Equipment used: Rolling walker (2 wheeled)             General transfer comment: attempted to stand and pt assisted to forward flexed position however pt felt unable to push to stand upright  Ambulation/Gait                 Stairs            Wheelchair Mobility    Modified Rankin (Stroke Patients Only)       Balance Overall balance assessment: Needs assistance Sitting-balance support: Bilateral upper extremity supported;Feet supported Sitting balance-Leahy Scale: Poor Sitting balance - Comments: requires UE support, pt performed sitting in upright position from back of recliner using UEs to assist x4 with hold until fatigued (approx 30-45 seconds), SpO2 also decreased to 83% on 8L HFNC with this activity, SPO2 improved back into 90s with rest breaks                                    Cognition Arousal/Alertness: Awake/alert Behavior During Therapy: WFL for tasks assessed/performed Overall Cognitive Status: Within Functional  Limits for tasks assessed                                        Exercises General Exercises - Upper Extremity Elbow Flexion: AROM;Both;10 reps;Seated Elbow Extension: AROM;Both;10 reps;Seated General Exercises - Lower Extremity Long Arc Quad: AROM;Both;10 reps;Seated Hip Flexion/Marching: AROM;Both;10 reps;Seated    General Comments        Pertinent Vitals/Pain Pain Assessment: Faces Faces Pain Scale: Hurts even more Pain Location: back Pain Descriptors / Indicators: Aching Pain Intervention(s): Limited activity within patient's tolerance;Monitored during session;Repositioned  SpO2 93% on 8L HFNC upon leaving room BP dropped to 94/50 bpm end of session, pt did not c/o dizziness    Home Living                      Prior Function            PT Goals (current goals can now be found in the care plan section) Acute Rehab PT Goals PT Goal Formulation: With patient Time For Goal Achievement: 02/01/17 Potential to Achieve Goals: Fair Progress towards PT goals: Not progressing toward goals - comment (significantly deconditioned)    Frequency  Min 3X/week      PT Plan Current plan remains appropriate    Co-evaluation              AM-PAC PT "6 Clicks" Daily Activity  Outcome Measure  Difficulty turning over in bed (including adjusting bedclothes, sheets and blankets)?: Total Difficulty moving from lying on back to sitting on the side of the bed? : Total Difficulty sitting down on and standing up from a chair with arms (e.g., wheelchair, bedside commode, etc,.)?: Total Help needed moving to and from a bed to chair (including a wheelchair)?: Total Help needed walking in hospital room?: Total Help needed climbing 3-5 steps with a railing? : Total 6 Click Score: 6    End of Session Equipment Utilized During Treatment: Oxygen Activity Tolerance: Patient limited by fatigue Patient left: with call bell/phone within reach;in chair   PT  Visit Diagnosis: Muscle weakness (generalized) (M62.81);Difficulty in walking, not elsewhere classified (R26.2)     Time: 8099-8338 PT Time Calculation (min) (ACUTE ONLY): 19 min  Charges:  $Therapeutic Activity: 8-22 mins                    G Codes:      Carmelia Bake, PT, DPT 01/18/2017 Pager: [phone]  York Ram E 01/18/2017, 12:46 PM

## 2017-01-18 NOTE — Progress Notes (Signed)
PROGRESS NOTE                                                                                                                                                                                                             Patient Demographics:    Gary Boyer, is a 81 y.o. male, DOB - May 29, 1936, RJJ:[id number]  Admit date - 01/06/2017   Admitting Physician Lavina Hamman, MD  Outpatient Primary MD for the patient is [name], MD  LOS - 12  Outpatient Specialists:None  Chief Complaint  Patient presents with  . Emesis       Brief Narrative   81 year old male with history of A. fib, PE on Coumadin, chronic CHF, COPD on home O2 presented to [facility] long EDD with coffee-ground emesis restarted on the night prior to admission. Patient was recently treated with penicillin and doxycycline for UTI. On admission his hemoglobin was 7 with positive FOBT and he was hypotensive. His INR was supratherapeutic. Patient received a total of 4 unit PRBC and 2 units of FFP. He was also in respiratory failure requiring Ventimask and nasal cannula. Patient was scheduled for EGD but chest x-ray done for his dyspnea symptoms showed right-sided opacification which required bronchoscopy on 7/8 with removal of thick mucoid secretion. He then had EGD on 7/9 showing esophagitis in the lower third of esophagus with contact bleeding which stopped spontaneously.. Also had three 4-7 mm bleeding angiodysplastic lesion in the stomach out of which one actively bleeding and was treated followed by hemostatic clips applied.  Patient also was treated for aspiration pneumonia with Unasyn.  Given persistent requirement for high flow nasal cannula and risk for intubation he was monitored under critical care service and transferred back to hospitalist service on 7/14.   Subjective:   Again did not tolerate BiPAP last night. Was placed on an RB. Still requiring high  flow nasal cannula (10 L). Complains of pain in his left hand. Thinks he has gout.   Assessment  & Plan :    Principal Problem: Acute respiratory failure with hypoxia Likely combination of acute lung injury, COPD exacerbation and aspiration pneumonia on presentation. Chest x-ray on 7/7 showed complete opacification right hemithorax requiring bronchoscopy and removal of mucous plug. Steroid has been discontinued. Continue chest PT.  Completed 7 days of antibiotics (Unasyn). No effusion seen  on ultrasound of the chest. Has not tolerated nighttime BiPAP and requiring 10 L high flow nasal cannula.  Continue Pulmicort and Brovana twice a day along with scheduled nebs. Chest PT and mobilized. Given persistent respiratory failure I have ask pulmonary to reevaluate today. Check repeat chest x-ray. Also consulted SLP with concern for silent aspiration.      Active Problems:    Acute upper GI bleed With acute blood loss anemia Secondary to bleeding angiodysplastic gastric lesions and distal esophagitis in the setting of supratherapeutic INR. Received 4 units PRBC and 2 units FFP.Marland Kitchen EGD findings as above.  Needs twice daily PPI for at least 8-12 weeks and once daily thereafter given large hiatal hernia.    PCCM  discussed with patient and family and recommend resuming anticoagulation after 8 weeks. (Family agree). Hemoglobin currently stable.  Pulmonary embolism ([place]) Admit regardless inhale due to active bleeding. After discussing with patient and family on risk versus benefit, decided on holding anticoagulation for 8 weeks.  Acute on Chronic diastolic CHF Has diuresed well with IV Lasix and has good urine output. Continue current dose of Lasix.  Chronic kidney disease stage 2-3 Renal function at baseline (1.5-1.8). Continue IV Lasix.     Atrial fibrillation ([place]) Rate controlled. Off anticoagulation. Monitor on telemetry.      Malnutrition of moderate degree Dietitian  consult.  Chronic depression Continue Zoloft  Insomnia  continue trazodone at bedtime  Aspiration pneumonia of right lower lobe (HCC) Completed 7 days of Unasyn. SLP evaluation.  COPD exacerbation Received prednisone upon admission initially, discontinued due to active GI bleed. Continue nebs.  Acute gouty arthritis Pain and swelling of the left hand. Cannot use NSAIDs. Will try to relieve pain with Tylenol. Try to avoid prednisone given his recent GI bleed.    Code Status : DO NOT RESUSCITATE  Family Communication  : None at bedside  Disposition Plan  : Needs SNF, may need palliative care consult was obtained discussion. Will follow-up with pulmonary.  Barriers For Discharge : Active symptoms  Consults  :   Pulmonary [facility]  Procedures  :  EGD Bronchoscopy  DVT Prophylaxis  :  SCDs  Lab Results  Component Value Date   PLT 182 01/18/2017    Antibiotics  :   Anti-infectives    Start     Dose/Rate Route Frequency Ordered Stop   01/06/17 1200  Ampicillin-Sulbactam (UNASYN) 3 g in sodium chloride 0.9 % 100 mL IVPB     3 g 200 mL/hr over 30 Minutes Intravenous Every 6 hours 01/06/17 1003 01/12/17 2359        Objective:   Vitals:   01/18/17 0500 01/18/17 0600 01/18/17 0741 01/18/17 0800  BP:  (!) 104/48  132/70  Pulse: 83 83 97 81  Resp: (!) 27 19 (!) 24 (!) 29  Temp:    99.9 F (37.7 C)  TempSrc:    Axillary  SpO2: 100% 99% 100% 96%  Weight:      Height:        Wt Readings from Last 3 Encounters:  01/18/17 109.1 kg (240 lb 8.4 oz)  01/15/15 133 kg (293 lb 3.4 oz)  11/08/12 (!) 139.3 kg (307 lb 1.6 oz)     Intake/Output Summary (Last 24 hours) at 01/18/17 0916 Last data filed at 01/18/17 0901  Gross per 24 hour  Intake              253 ml  Output  2070 ml  Net            -1817 ml     Physical Exam Gen.: Elderly male appears fatigued, requiring high flow nasal cannula HEENT: Pallor present, moist, supple neck Chest: Clear  breath sounds bilaterally, no added sounds CVS: Normal S1 and S2, no murmurs GI: Soft, nondistended , nontender, Foley + Musculoskeletal: Warm, no edema,  increased swelling or tenderness in left hand and wrist.     Data Review:    CBC  Recent Labs Lab 01/12/17 0328 01/14/17 0300 01/16/17 1506 01/18/17 0329  WBC 6.0 5.8 8.6 8.8  HGB 8.7* 8.8* 9.8* 8.9*  HCT 29.1* 29.9* 32.1* 29.5*  PLT 194 160 187 182  MCV 82.2 82.6 79.9 78.9  MCH 24.6* 24.3* 24.4* 23.8*  MCHC 29.9* 29.4* 30.5 30.2  RDW 19.9* 19.8* 19.7* 19.8*    Chemistries   Recent Labs Lab 01/14/17 0300 01/15/17 0307 01/16/17 0346 01/17/17 0345 01/18/17 0329  NA 143 140 141 139 137  K 4.1 4.5 3.8 3.7 3.4*  CL 101 96* 94* 95* 93*  CO2 35* 35* 36* 36* 36*  GLUCOSE 101* 91 106* 115* 112*  BUN 24* 23* 27* 28* 30*  CREATININE 1.16 1.34* 1.43* 1.55* 1.58*  CALCIUM 8.3* 8.5* 8.7* 8.3* 8.3*   ------------------------------------------------------------------------------------------------------------------ No results for input(s): CHOL, HDL, LDLCALC, TRIG, CHOLHDL, LDLDIRECT in the last 72 hours.  No results found for: HGBA1C ------------------------------------------------------------------------------------------------------------------ No results for input(s): TSH, T4TOTAL, T3FREE, THYROIDAB in the last 72 hours.  Invalid input(s): FREET3 ------------------------------------------------------------------------------------------------------------------ No results for input(s): VITAMINB12, FOLATE, FERRITIN, TIBC, IRON, RETICCTPCT in the last 72 hours.  Coagulation profile  Recent Labs Lab 01/14/17 0300 01/15/17 0307 01/16/17 0346 01/17/17 0345 01/18/17 0329  INR 2.16 1.87 1.70 1.54 1.55    No results for input(s): DDIMER in the last 72 hours.  Cardiac Enzymes No results for input(s): CKMB, TROPONINI, MYOGLOBIN in the last 168 hours.  Invalid input(s):  CK ------------------------------------------------------------------------------------------------------------------    Component Value Date/Time   BNP 306.7 (H) 01/14/2017 0300    Inpatient Medications  Scheduled Meds: . arformoterol  15 mcg Nebulization BID  . budesonide  0.5 mg Nebulization BID  . fentaNYL (SUBLIMAZE) injection  100 mcg Intravenous Once  . folic acid  1 mg Oral Daily  . furosemide  80 mg Intravenous Daily  . ipratropium-albuterol  3 mL Nebulization QID  . lidocaine  1 application Other Once  . mouth rinse  15 mL Mouth Rinse BID  . multivitamin with minerals  1 tablet Oral Daily  . nystatin  5 mL Oral QID  . pantoprazole  40 mg Oral BID  . protein supplement shake  11 oz Oral Q24H  . sertraline  100 mg Oral QHS  . sodium chloride flush  3 mL Intravenous Q12H   Continuous Infusions: PRN Meds:.acetaminophen **OR** acetaminophen, ipratropium-albuterol, [DISCONTINUED] ondansetron **OR** ondansetron (ZOFRAN) IV, polyethylene glycol, sodium bicarbonate/sodium chloride, sodium chloride, traZODone  Micro Results Recent Results (from the past 240 hour(s))  Culture, respiratory (NON-Expectorated)     Status: None   Collection Time: 01/10/17  5:30 PM  Result Value Ref Range Status   Specimen Description BRONCHIAL ALVEOLAR LAVAGE RIGHT  Final   Special Requests NONE  Final   Gram Stain   Final    MODERATE WBC PRESENT, PREDOMINANTLY PMN NO ORGANISMS SEEN    Culture   Final    Consistent with normal respiratory flora. Performed at [facility], [place] [address] [place], [place] [address]  Report Status 01/13/2017 FINAL  Final    Radiology Reports Ct Chest Wo Contrast  Result Date: 01/06/2017 CLINICAL DATA:  81 y.o. male with Past medical history of A. fib, PE, chronic warfarin use, chronic CHF, chronic respiratory failure with hypoxia, COPD. Patient presents with complaints of coffee-ground emesis started overnight. Has poor appetite and an unintentional  weight loss. EXAM: CT CHEST WITHOUT CONTRAST TECHNIQUE: Multidetector CT imaging of the chest was performed following the standard protocol without IV contrast. COMPARISON:  CT of the abdomen and pelvis 01/09/2014 and CT of the chest 10/01/2010 FINDINGS: Cardiovascular: The heart is enlarged. There is extensive coronary artery calcification. No pericardial effusion. Significant atherosclerosis of the thoracic aorta. Aortic arch is atherosclerotic. Main pulmonary artery is prominent, main pulmonary artery is enlarged, measuring 4.1 cm. Mediastinum/Nodes: The visualized portion of the thyroid gland has a normal appearance. Large hiatal hernia. Lungs/Pleura: Right pleural effusion and right lower lobe consolidation. There is vascular crowding at both lung bases. There are pleural calcifications. Mild airspace filling is identified within the right upper lobe. Cavitary lesion identified in the left lung apex measuring 1.6 by 4.1 cm. This is smaller compared to 2012 There are paraseptal emphysematous changes in the lung apices bilaterally. Upper Abdomen: There is cirrhotic contour of the liver. Spleen appears enlarged but is only partially imaged. Indeterminate mass is identified in the midpole region of the left kidney, measuring 4.1 cm. There are intrarenal calcifications bilaterally. Adrenal glands are normal in appearance. Musculoskeletal: Degenerative changes are seen in the thoracic spine. IMPRESSION: 1. Cardiomegaly, coronary artery disease and thoracic atherosclerosis. 2. Enlarged pulmonary artery, consistent with pulmonary arterial hypertension. 3. Right pleural effusion. Airspace filling opacities, consistent with infectious process in the right lower lobe and upper lobes. 4. Pleural calcifications, consistent with asbestos exposure. 5. Cavitary lesion in the left upper lobe. 6. Paraseptal emphysema. 7. Large hiatal hernia. 8. Cirrhosis of the liver. 9. Suspect splenomegaly and portal venous hypertension. 10.  Left renal mass. Based on previous imaging, this likely represented a cyst but by today's imaging is indeterminate. Consider renal ultrasound for further characterization. 11. Bilateral intrarenal calculi. Aortic Atherosclerosis (ICD10-I70.0) and Emphysema (ICD10-J43.9). Electronically Signed   By: Nolon Nations M.D.   On: 01/06/2017 16:23   Dg Chest Port 1 View  Result Date: 01/15/2017 CLINICAL DATA:  Pneumonia, history of asthma EXAM: PORTABLE CHEST 1 VIEW COMPARISON:  01/14/2017 and prior FINDINGS: Improving aeration bilaterally. Residual bilateral effusions, small on the left and moderate on the right. Bibasilar consolidations. Cardiomegaly with central vascular congestion but decreased compared to prior. IMPRESSION: 1. Improved aeration compared to most recent prior radiograph. 2. Bilateral effusions, small on the left and moderate on the right with bibasilar consolidations 3. Cardiomegaly with central vascular congestion Electronically Signed   By: Donavan Foil M.D.   On: 01/15/2017 04:02   Dg Chest Port 1 View  Result Date: 01/14/2017 CLINICAL DATA:  Followup pneumonia EXAM: PORTABLE CHEST 1 VIEW COMPARISON:  01/13/2017 FINDINGS: Marked radiographic worsening with subtotal collapse of the right lung due to consolidation, pleural fluid and volume loss. Mediastinal shift towards the right. Patchy infiltrate persists at the left lung base. Left upper lung as well aerated. IMPRESSION: Worsened infiltrate, effusion collapse of the right lung. Electronically Signed   By: Nelson Chimes M.D.   On: 01/14/2017 06:50   Dg Chest Port 1 View  Result Date: 01/13/2017 CLINICAL DATA:  Respiratory failure, hypoxia. EXAM: PORTABLE CHEST 1 VIEW COMPARISON:  Radiograph of January 12, 2017. FINDINGS:  Stable cardiomegaly and central pulmonary vascular congestion. No pneumothorax is noted. Degenerative changes are seen involving both shoulders. Stable bilateral perihilar and basilar densities are noted concerning for  edema or atelectasis with mild associated pleural effusions. Bony thorax is unremarkable. IMPRESSION: Stable bilateral lung opacities are noted concerning for edema or atelectasis with associated pleural effusions. Electronically Signed   By: Marijo Conception, M.D.   On: 01/13/2017 07:22   Dg Chest Port 1 View  Result Date: 01/12/2017 CLINICAL DATA:  Respiratory failure EXAM: PORTABLE CHEST 1 VIEW COMPARISON:  January 10, 2017 FINDINGS: There is a persistent right pleural effusion with consolidation in the right upper lobe and right base regions. There is mild left base atelectasis. Heart is mildly enlarged with pulmonary vascularity within normal limits. There is aortic atherosclerosis. No adenopathy. There is arthropathy in each shoulder. IMPRESSION: Persistent right pleural effusion with areas of consolidation on the right, unchanged. There is stable left base atelectasis. Overall no new opacity. Stable cardiac prominence. There is aortic atherosclerosis. Aortic Atherosclerosis (ICD10-I70.0). Electronically Signed   By: Lowella Grip III M.D.   On: 01/12/2017 07:09   Dg Chest Port 1 View  Result Date: 01/10/2017 CLINICAL DATA:  Atelectasis.  Shortness of breath EXAM: PORTABLE CHEST 1 VIEW COMPARISON:  Study obtained earlier in the day and January 09, 2017 FINDINGS: There is stable consolidation in the right upper lobe and right base regions. There is a small right pleural effusion. There is also patchy atelectasis in the left base. No new opacity compared earlier in the day. Heart is mildly enlarged with pulmonary vascularity within normal limits. No evident adenopathy. There is arthropathy in each shoulder. IMPRESSION: Areas of consolidation and volume loss on the right. Small right pleural effusion. Patchy atelectasis left base. No new opacity compared to earlier in the day. Stable cardiac silhouette. Electronically Signed   By: Lowella Grip III M.D.   On: 01/10/2017 17:51   Dg Chest Port 1  View  Result Date: 01/10/2017 CLINICAL DATA:  Atelectasis.  Shortness breath. EXAM: PORTABLE CHEST 1 VIEW COMPARISON:  01/09/2017 FINDINGS: The left costophrenic angle is not included. Technologist note indicates: "ordering MD did not needed the image to be repeated to include costophrenic angles." There is significantly improved aeration in the right hemithorax. Continued volume loss on the right with shift of cardiac and mediastinal structures to the right. This is difficult to quantify given the rightward rotation of the chest during imaging. Continued obscuration of the right hemidiaphragm possibly from layering effusion, atelectasis, or pneumonia. Poor definition of the right mediastinal margin. Vague densities at the left lung base. Indistinct pulmonary vasculature. Potential enlargement of the cardiopericardial silhouette. Considerable degenerative arthropathy of the right glenohumeral joint. Reduced acromiohumeral distance on the right suspicious for chronic rotator cuff tear. IMPRESSION: 1. Interval improved aeration in the right hemithorax, but with continued considerable airspace opacity especially at the right lung base and adjacent to the right mediastinum. Cannot exclude layering right pleural effusion. 2. Indistinct airspace opacity in left lower lobe, potentially from edema or pneumonia. 3. Suspected cardiomegaly. Indistinct pulmonary vasculature suggesting pulmonary venous hypertension and potentially mild interstitial edema. 4. Chronic right rotator cuff tear with advanced degenerative glenohumeral arthropathy on the right. Electronically Signed   By: Van Clines M.D.   On: 01/10/2017 17:20   Dg Chest Port 1 View  Result Date: 01/09/2017 CLINICAL DATA:  Worsening shortness of breath. Question aspiration pneumonia. EXAM: PORTABLE CHEST 1 VIEW COMPARISON:  January 06, 2017 FINDINGS: There  is complete opacification of the right hemithorax which is worsened in the interval. There is volume loss  on the right with shift of the heart mediastinum. Diffuse interstitial opacities in the left lung have worsened. No other interval changes. No pneumothorax. IMPRESSION: 1. Complete opacification of the right hemithorax has worsened in the interval. Recommend clinical correlation and follow-up to resolution. 2. Diffuse interstitial opacities in the left lung are worse in the interval most consistent with edema. Electronically Signed   By: Dorise Bullion III M.D   On: 01/09/2017 09:37   Dg Abd Acute W/chest  Result Date: 01/06/2017 CLINICAL DATA:  Patient with back pain. Coffee-ground emesis. Shortness of breath. EXAM: DG ABDOMEN ACUTE W/ 1V CHEST COMPARISON:  Chest radiograph 08/21/2015. FINDINGS: Patient is mildly rotated to the right. Stable enlarged cardiac and mediastinal contours. Hiatal hernia. There is a large area of consolidation throughout the right mid and lower lung. Left lung is clear. Possible small right pleural effusion. No pneumothorax. Gas is demonstrated within nondilated loops of large and small bowel in a nonobstructed pattern. No evidence for free intraperitoneal air. Marked degenerative changes of the lumbar spine. IMPRESSION: Nonobstructed bowel gas pattern. Patchy consolidation within the right mid and lower lung which may represent combination of atelectasis and scarring within the right lung base. Superimposed infectious process not excluded. Recommend further evaluation with chest CT to exclude underlying pulmonary mass. Electronically Signed   By: Lovey Newcomer M.D.   On: 01/06/2017 09:09    Time Spent in minutes  35   Louellen Molder M.D on 01/18/2017 at 9:16 AM  Between 7am to 7pm - Pager - [phone]  After 7pm go to [email or web address] - password [facility]  [facility] -  Office  [phone]

## 2017-01-19 ENCOUNTER — Inpatient Hospital Stay (HOSPITAL_COMMUNITY): Payer: Medicare Other

## 2017-01-19 LAB — BASIC METABOLIC PANEL
ANION GAP: 10 (ref 5–15)
BUN: 37 mg/dL — ABNORMAL HIGH (ref 6–20)
CALCIUM: 8.5 mg/dL — AB (ref 8.9–10.3)
CO2: 35 mmol/L — AB (ref 22–32)
CREATININE: 1.51 mg/dL — AB (ref 0.61–1.24)
Chloride: 92 mmol/L — ABNORMAL LOW (ref 101–111)
GFR, EST AFRICAN AMERICAN: 49 mL/min — AB (ref >60)
GFR, EST NON AFRICAN AMERICAN: 42 mL/min — AB (ref >60)
Glucose, Bld: 135 mg/dL — ABNORMAL HIGH (ref 65–99)
Potassium: 3.5 mmol/L (ref 3.5–5.1)
Sodium: 137 mmol/L (ref 135–145)

## 2017-01-19 LAB — PROTIME-INR
INR: 1.5
PROTHROMBIN TIME: 18.3 s — AB (ref 11.4–15.2)

## 2017-01-19 MED ORDER — POTASSIUM CHLORIDE CRYS ER 20 MEQ PO TBCR
40.0000 meq | EXTENDED_RELEASE_TABLET | Freq: Once | ORAL | Status: AC
  Administered 2017-01-19: 40 meq via ORAL
  Filled 2017-01-19: qty 2

## 2017-01-19 MED ORDER — PREDNISONE 20 MG PO TABS
40.0000 mg | ORAL_TABLET | Freq: Every day | ORAL | Status: AC
  Administered 2017-01-19 – 2017-01-22 (×4): 40 mg via ORAL
  Filled 2017-01-19 (×4): qty 2

## 2017-01-19 NOTE — Progress Notes (Signed)
PROGRESS NOTE                                                                                                                                                                                                             Patient Demographics:    Gary Boyer, is a 81 y.o. male, DOB - 03/17/1936, RFF:[id number]  Admit date - 01/06/2017   Admitting Physician Lavina Hamman, MD  Outpatient Primary MD for the patient is [name], MD  LOS - 13  Outpatient Specialists:None  Chief Complaint  Patient presents with  . Emesis       Brief Narrative   81 year old male with history of A. fib, PE on Coumadin, chronic CHF, COPD on home O2 presented to [facility] long EDD with coffee-ground emesis restarted on the night prior to admission. Patient was recently treated with penicillin and doxycycline for UTI. On admission his hemoglobin was 7 with positive FOBT and he was hypotensive. His INR was supratherapeutic. Patient received a total of 4 unit PRBC and 2 units of FFP. He was also in respiratory failure requiring Ventimask and nasal cannula. Patient was scheduled for EGD but chest x-ray done for his dyspnea symptoms showed right-sided opacification which required bronchoscopy on 7/8 with removal of thick mucoid secretion. He then had EGD on 7/9 showing esophagitis in the lower third of esophagus with contact bleeding which stopped spontaneously.. Also had three 4-7 mm bleeding angiodysplastic lesion in the stomach out of which one actively bleeding and was treated followed by hemostatic clips applied.  Patient also was treated for aspiration pneumonia with Unasyn.  Given persistent requirement for high flow nasal cannula and risk for intubation he was monitored under critical care service and transferred back to hospitalist service on 7/14.   Subjective:   Tolerated BiPAP Last night . Left hand much improved after adding steroids. .Now  down to 8 L via high flow nasal cannula  Assessment  & Plan :    Principal Problem: Acute respiratory failure with hypoxia -Likely combination of acute lung injury, COPD exacerbation and aspiration pneumonia on presentation. Chest x-ray on 7/7 showed complete opacification right hemithorax requiring bronchoscopy and removal of mucous plug. Continue chest PT. -Pulmonary consult appreciated. Continue Pulmicort and Brovana twice a day along with scheduled meds. Added hypertonic saline nebs. Continue pulmonary hygiene. Seen by SLP  and dysphagia level III diet. -Chest x-ray shows persistent bilateral pulmonary infiltrates/edema. Marland Kitchenalso noted increased densities in both lung apices. Continue current dose of IV Lasix.  Wean oxygen as tolerated.      Active Problems:    Acute upper GI bleed With acute blood loss anemia Secondary to bleeding angiodysplastic gastric lesions and distal esophagitis in the setting of supratherapeutic INR. Received 4 units PRBC and 2 units FFP.Marland Kitchen EGD findings as above.  Needs twice daily PPI for at least 8-12 weeks and once daily thereafter given large hiatal hernia.    PCCM  discussed with patient and family and recommend resuming anticoagulation after 8 weeks. (Family agree). Hemoglobin currently stable.  Acute gouty arthritis Involving left hand and wrist. Added prednisone and symptoms better.  Her total 5 days  Pulmonary embolism ([place]) Admit regardless inhale due to active bleeding. After discussing with patient and family on risk versus benefit, decided on holding anticoagulation for 8 weeks.  Acute on Chronic diastolic CHF Has diuresed well with IV Lasix and has good urine output. Chest x-ray still shows significant Continue current dose of Lasix.  Chronic kidney disease stage 2-3 Renal function at baseline (1.5-1.8). Continue IV Lasix.     Atrial fibrillation ([place]) Rate controlled. Off anticoagulation. Monitor on telemetry.      Malnutrition of  moderate degree Added protein supplement.  Chronic depression Continue Zoloft  Insomnia  continue trazodone at bedtime  Aspiration pneumonia of right lower lobe (HCC) Completed 7 days of Unasyn. Seen by SLP and had MBS on 7/16. Has pharyngeal dysphagia with aspiration risk. On dysphagia level III diet  COPD exacerbation Continue nebs.      Code Status : DO NOT RESUSCITATE  Family Communication  : None at bedside  Disposition Plan  : Needs SNF, may need palliative care consult if no significant improvement.  Barriers For Discharge : Active symptoms  Consults  :   Pulmonary [facility]  Procedures  :  EGD Bronchoscopy  DVT Prophylaxis  :  SCDs  Lab Results  Component Value Date   PLT 182 01/18/2017    Antibiotics  :   Anti-infectives    Start     Dose/Rate Route Frequency Ordered Stop   01/06/17 1200  Ampicillin-Sulbactam (UNASYN) 3 g in sodium chloride 0.9 % 100 mL IVPB     3 g 200 mL/hr over 30 Minutes Intravenous Every 6 hours 01/06/17 1003 01/12/17 2359        Objective:   Vitals:   01/19/17 0956 01/19/17 1000 01/19/17 1151 01/19/17 1200  BP:  139/73  136/71  Pulse:  70  73  Resp:  19  (!) 25  Temp:   98.1 F (36.7 C)   TempSrc:   Oral   SpO2: 94% 99%  90%  Weight:      Height:        Wt Readings from Last 3 Encounters:  01/19/17 109.2 kg (240 lb 11.9 oz)  01/15/15 133 kg (293 lb 3.4 oz)  11/08/12 (!) 139.3 kg (307 lb 1.6 oz)     Intake/Output Summary (Last 24 hours) at 01/19/17 1247 Last data filed at 01/19/17 1200  Gross per 24 hour  Intake              240 ml  Output             1750 ml  Net            -1510 ml     Physical Exam  Gen.: Elderly male on HFNC, fatigued HEENT: moist mucosa, supple neck Chest: diminished breath sounds b/l CVS: NS1&S2, no murmurs GI: soft, NT, ND, foley+ Musculoskeletal: left hand swelling and tenderness improved, no leg edema     Data Review:    CBC  Recent Labs Lab 01/14/17 0300  01/16/17 1506 01/18/17 0329  WBC 5.8 8.6 8.8  HGB 8.8* 9.8* 8.9*  HCT 29.9* 32.1* 29.5*  PLT 160 187 182  MCV 82.6 79.9 78.9  MCH 24.3* 24.4* 23.8*  MCHC 29.4* 30.5 30.2  RDW 19.8* 19.7* 19.8*    Chemistries   Recent Labs Lab 01/15/17 0307 01/16/17 0346 01/17/17 0345 01/18/17 0329 01/19/17 0309  NA 140 141 139 137 137  K 4.5 3.8 3.7 3.4* 3.5  CL 96* 94* 95* 93* 92*  CO2 35* 36* 36* 36* 35*  GLUCOSE 91 106* 115* 112* 135*  BUN 23* 27* 28* 30* 37*  CREATININE 1.34* 1.43* 1.55* 1.58* 1.51*  CALCIUM 8.5* 8.7* 8.3* 8.3* 8.5*   ------------------------------------------------------------------------------------------------------------------ No results for input(s): CHOL, HDL, LDLCALC, TRIG, CHOLHDL, LDLDIRECT in the last 72 hours.  No results found for: HGBA1C ------------------------------------------------------------------------------------------------------------------ No results for input(s): TSH, T4TOTAL, T3FREE, THYROIDAB in the last 72 hours.  Invalid input(s): FREET3 ------------------------------------------------------------------------------------------------------------------ No results for input(s): VITAMINB12, FOLATE, FERRITIN, TIBC, IRON, RETICCTPCT in the last 72 hours.  Coagulation profile  Recent Labs Lab 01/15/17 0307 01/16/17 0346 01/17/17 0345 01/18/17 0329 01/19/17 0309  INR 1.87 1.70 1.54 1.55 1.50    No results for input(s): DDIMER in the last 72 hours.  Cardiac Enzymes No results for input(s): CKMB, TROPONINI, MYOGLOBIN in the last 168 hours.  Invalid input(s): CK ------------------------------------------------------------------------------------------------------------------    Component Value Date/Time   BNP 306.7 (H) 01/14/2017 0300    Inpatient Medications  Scheduled Meds: . arformoterol  15 mcg Nebulization BID  . budesonide  0.5 mg Nebulization BID  . fentaNYL (SUBLIMAZE) injection  100 mcg Intravenous Once  . folic  acid  1 mg Oral Daily  . furosemide  80 mg Intravenous Daily  . ipratropium-albuterol  3 mL Nebulization QID  . lidocaine  1 application Other Once  . mouth rinse  15 mL Mouth Rinse BID  . multivitamin with minerals  1 tablet Oral Daily  . nystatin  5 mL Oral QID  . pantoprazole  40 mg Oral BID  . predniSONE  40 mg Oral Q breakfast  . protein supplement shake  11 oz Oral Q24H  . sertraline  100 mg Oral QHS  . sodium chloride flush  3 mL Intravenous Q12H  . sodium chloride HYPERTONIC  4 mL Nebulization Daily   Continuous Infusions: PRN Meds:.acetaminophen **OR** acetaminophen, ipratropium-albuterol, [DISCONTINUED] ondansetron **OR** ondansetron (ZOFRAN) IV, polyethylene glycol, sodium bicarbonate/sodium chloride, sodium chloride, traZODone  Micro Results Recent Results (from the past 240 hour(s))  Culture, respiratory (NON-Expectorated)     Status: None   Collection Time: 01/10/17  5:30 PM  Result Value Ref Range Status   Specimen Description BRONCHIAL ALVEOLAR LAVAGE RIGHT  Final   Special Requests NONE  Final   Gram Stain   Final    MODERATE WBC PRESENT, PREDOMINANTLY PMN NO ORGANISMS SEEN    Culture   Final    Consistent with normal respiratory flora. Performed at [facility], [place] [address] [place], [place] [address]    Report Status 01/13/2017 FINAL  Final    Radiology Reports Ct Chest Wo Contrast  Result Date: 01/06/2017 CLINICAL DATA:  81 y.o. male with Past medical  history of A. fib, PE, chronic warfarin use, chronic CHF, chronic respiratory failure with hypoxia, COPD. Patient presents with complaints of coffee-ground emesis started overnight. Has poor appetite and an unintentional weight loss. EXAM: CT CHEST WITHOUT CONTRAST TECHNIQUE: Multidetector CT imaging of the chest was performed following the standard protocol without IV contrast. COMPARISON:  CT of the abdomen and pelvis 01/09/2014 and CT of the chest 10/01/2010 FINDINGS: Cardiovascular: The heart is  enlarged. There is extensive coronary artery calcification. No pericardial effusion. Significant atherosclerosis of the thoracic aorta. Aortic arch is atherosclerotic. Main pulmonary artery is prominent, main pulmonary artery is enlarged, measuring 4.1 cm. Mediastinum/Nodes: The visualized portion of the thyroid gland has a normal appearance. Large hiatal hernia. Lungs/Pleura: Right pleural effusion and right lower lobe consolidation. There is vascular crowding at both lung bases. There are pleural calcifications. Mild airspace filling is identified within the right upper lobe. Cavitary lesion identified in the left lung apex measuring 1.6 by 4.1 cm. This is smaller compared to 2012 There are paraseptal emphysematous changes in the lung apices bilaterally. Upper Abdomen: There is cirrhotic contour of the liver. Spleen appears enlarged but is only partially imaged. Indeterminate mass is identified in the midpole region of the left kidney, measuring 4.1 cm. There are intrarenal calcifications bilaterally. Adrenal glands are normal in appearance. Musculoskeletal: Degenerative changes are seen in the thoracic spine. IMPRESSION: 1. Cardiomegaly, coronary artery disease and thoracic atherosclerosis. 2. Enlarged pulmonary artery, consistent with pulmonary arterial hypertension. 3. Right pleural effusion. Airspace filling opacities, consistent with infectious process in the right lower lobe and upper lobes. 4. Pleural calcifications, consistent with asbestos exposure. 5. Cavitary lesion in the left upper lobe. 6. Paraseptal emphysema. 7. Large hiatal hernia. 8. Cirrhosis of the liver. 9. Suspect splenomegaly and portal venous hypertension. 10. Left renal mass. Based on previous imaging, this likely represented a cyst but by today's imaging is indeterminate. Consider renal ultrasound for further characterization. 11. Bilateral intrarenal calculi. Aortic Atherosclerosis (ICD10-I70.0) and Emphysema (ICD10-J43.9).  Electronically Signed   By: Nolon Nations M.D.   On: 01/06/2017 16:23   Dg Chest Port 1 View  Result Date: 01/19/2017 CLINICAL DATA:  Fever.  Coughing . EXAM: PORTABLE CHEST 1 VIEW COMPARISON:  01/18/2017. FINDINGS: Persistent cardiomegaly. Bilateral pulmonary infiltrates and/or edema again noted. Bilateral pleural effusions again noted . Increase densities noted over both pulmonary apices, possibly overlying vascular structures and or new infiltrates. IMPRESSION: Persistent cardiomegaly. Persistent bilateral pulmonary infiltrates and/or edema again noted. Bilateral pleural effusions again noted. CHF could present this fashion. Associated pneumonia cannot be excluded. 2. Increased densities noted over both pulmonary apices, possibly overlying vascular structures and/or new infiltrates. Electronically Signed   By: Marcello Moores  Register   On: 01/19/2017 09:15   Dg Chest Port 1 View  Result Date: 01/18/2017 CLINICAL DATA:  Respiratory failure EXAM: PORTABLE CHEST 1 VIEW COMPARISON:  January 15, 2017 FINDINGS: There are persistent pleural effusions bilaterally, larger on the right than on the left. There is patchy airspace consolidation in both lower lung zone regions. There is cardiomegaly with pulmonary vascularity within normal limits. No adenopathy. There is extensive arthropathy in the right shoulder, stable. There is aortic atherosclerosis. IMPRESSION: Persistent cardiomegaly with bilateral pleural effusions. Likely degree of pulmonary vascular congestion. Consolidation in lower lung zone regions is likely due to superimposed pneumonia, although a degree of atelectasis and possibly alveolar edema may be contributory. There is aortic atherosclerosis. Aortic Atherosclerosis (ICD10-I70.0). Electronically Signed   By: Lowella Grip III M.D.   On:  01/18/2017 09:52   Dg Chest Port 1 View  Result Date: 01/15/2017 CLINICAL DATA:  Pneumonia, history of asthma EXAM: PORTABLE CHEST 1 VIEW COMPARISON:  01/14/2017  and prior FINDINGS: Improving aeration bilaterally. Residual bilateral effusions, small on the left and moderate on the right. Bibasilar consolidations. Cardiomegaly with central vascular congestion but decreased compared to prior. IMPRESSION: 1. Improved aeration compared to most recent prior radiograph. 2. Bilateral effusions, small on the left and moderate on the right with bibasilar consolidations 3. Cardiomegaly with central vascular congestion Electronically Signed   By: Donavan Foil M.D.   On: 01/15/2017 04:02   Dg Chest Port 1 View  Result Date: 01/14/2017 CLINICAL DATA:  Followup pneumonia EXAM: PORTABLE CHEST 1 VIEW COMPARISON:  01/13/2017 FINDINGS: Marked radiographic worsening with subtotal collapse of the right lung due to consolidation, pleural fluid and volume loss. Mediastinal shift towards the right. Patchy infiltrate persists at the left lung base. Left upper lung as well aerated. IMPRESSION: Worsened infiltrate, effusion collapse of the right lung. Electronically Signed   By: Nelson Chimes M.D.   On: 01/14/2017 06:50   Dg Chest Port 1 View  Result Date: 01/13/2017 CLINICAL DATA:  Respiratory failure, hypoxia. EXAM: PORTABLE CHEST 1 VIEW COMPARISON:  Radiograph of January 12, 2017. FINDINGS: Stable cardiomegaly and central pulmonary vascular congestion. No pneumothorax is noted. Degenerative changes are seen involving both shoulders. Stable bilateral perihilar and basilar densities are noted concerning for edema or atelectasis with mild associated pleural effusions. Bony thorax is unremarkable. IMPRESSION: Stable bilateral lung opacities are noted concerning for edema or atelectasis with associated pleural effusions. Electronically Signed   By: Marijo Conception, M.D.   On: 01/13/2017 07:22   Dg Chest Port 1 View  Result Date: 01/12/2017 CLINICAL DATA:  Respiratory failure EXAM: PORTABLE CHEST 1 VIEW COMPARISON:  January 10, 2017 FINDINGS: There is a persistent right pleural effusion with  consolidation in the right upper lobe and right base regions. There is mild left base atelectasis. Heart is mildly enlarged with pulmonary vascularity within normal limits. There is aortic atherosclerosis. No adenopathy. There is arthropathy in each shoulder. IMPRESSION: Persistent right pleural effusion with areas of consolidation on the right, unchanged. There is stable left base atelectasis. Overall no new opacity. Stable cardiac prominence. There is aortic atherosclerosis. Aortic Atherosclerosis (ICD10-I70.0). Electronically Signed   By: Lowella Grip III M.D.   On: 01/12/2017 07:09   Dg Chest Port 1 View  Result Date: 01/10/2017 CLINICAL DATA:  Atelectasis.  Shortness of breath EXAM: PORTABLE CHEST 1 VIEW COMPARISON:  Study obtained earlier in the day and January 09, 2017 FINDINGS: There is stable consolidation in the right upper lobe and right base regions. There is a small right pleural effusion. There is also patchy atelectasis in the left base. No new opacity compared earlier in the day. Heart is mildly enlarged with pulmonary vascularity within normal limits. No evident adenopathy. There is arthropathy in each shoulder. IMPRESSION: Areas of consolidation and volume loss on the right. Small right pleural effusion. Patchy atelectasis left base. No new opacity compared to earlier in the day. Stable cardiac silhouette. Electronically Signed   By: Lowella Grip III M.D.   On: 01/10/2017 17:51   Dg Chest Port 1 View  Result Date: 01/10/2017 CLINICAL DATA:  Atelectasis.  Shortness breath. EXAM: PORTABLE CHEST 1 VIEW COMPARISON:  01/09/2017 FINDINGS: The left costophrenic angle is not included. Technologist note indicates: "ordering MD did not needed the image to be repeated to include costophrenic  angles." There is significantly improved aeration in the right hemithorax. Continued volume loss on the right with shift of cardiac and mediastinal structures to the right. This is difficult to quantify given  the rightward rotation of the chest during imaging. Continued obscuration of the right hemidiaphragm possibly from layering effusion, atelectasis, or pneumonia. Poor definition of the right mediastinal margin. Vague densities at the left lung base. Indistinct pulmonary vasculature. Potential enlargement of the cardiopericardial silhouette. Considerable degenerative arthropathy of the right glenohumeral joint. Reduced acromiohumeral distance on the right suspicious for chronic rotator cuff tear. IMPRESSION: 1. Interval improved aeration in the right hemithorax, but with continued considerable airspace opacity especially at the right lung base and adjacent to the right mediastinum. Cannot exclude layering right pleural effusion. 2. Indistinct airspace opacity in left lower lobe, potentially from edema or pneumonia. 3. Suspected cardiomegaly. Indistinct pulmonary vasculature suggesting pulmonary venous hypertension and potentially mild interstitial edema. 4. Chronic right rotator cuff tear with advanced degenerative glenohumeral arthropathy on the right. Electronically Signed   By: Van Clines M.D.   On: 01/10/2017 17:20   Dg Chest Port 1 View  Result Date: 01/09/2017 CLINICAL DATA:  Worsening shortness of breath. Question aspiration pneumonia. EXAM: PORTABLE CHEST 1 VIEW COMPARISON:  January 06, 2017 FINDINGS: There is complete opacification of the right hemithorax which is worsened in the interval. There is volume loss on the right with shift of the heart mediastinum. Diffuse interstitial opacities in the left lung have worsened. No other interval changes. No pneumothorax. IMPRESSION: 1. Complete opacification of the right hemithorax has worsened in the interval. Recommend clinical correlation and follow-up to resolution. 2. Diffuse interstitial opacities in the left lung are worse in the interval most consistent with edema. Electronically Signed   By: Dorise Bullion III M.D   On: 01/09/2017 09:37   Dg Abd  Acute W/chest  Result Date: 01/06/2017 CLINICAL DATA:  Patient with back pain. Coffee-ground emesis. Shortness of breath. EXAM: DG ABDOMEN ACUTE W/ 1V CHEST COMPARISON:  Chest radiograph 08/21/2015. FINDINGS: Patient is mildly rotated to the right. Stable enlarged cardiac and mediastinal contours. Hiatal hernia. There is a large area of consolidation throughout the right mid and lower lung. Left lung is clear. Possible small right pleural effusion. No pneumothorax. Gas is demonstrated within nondilated loops of large and small bowel in a nonobstructed pattern. No evidence for free intraperitoneal air. Marked degenerative changes of the lumbar spine. IMPRESSION: Nonobstructed bowel gas pattern. Patchy consolidation within the right mid and lower lung which may represent combination of atelectasis and scarring within the right lung base. Superimposed infectious process not excluded. Recommend further evaluation with chest CT to exclude underlying pulmonary mass. Electronically Signed   By: Lovey Newcomer M.D.   On: 01/06/2017 09:09   Dg Swallowing Func-speech Pathology  Result Date: 01/18/2017 Objective Swallowing Evaluation: Type of Study: MBS-Modified Barium Swallow Study Patient Details Name: Tyreke Kaeser MRN: [id number] Date of Birth: Apr 24, 1936 Today's Date: 01/18/2017 Time: SLP Start Time (ACUTE ONLY): 1500-SLP Stop Time (ACUTE ONLY): 1530 SLP Time Calculation (min) (ACUTE ONLY): 30 min Past Medical History: Past Medical History: Diagnosis Date . Arthritis   ALL OVER . Asthma  . Atrial fibrillation ([place])   PAST HX OF HEART ABLATION FOR IRREGULAR HEART RATE --SUCCESSFUL-NO FURTHER PROB WITH IRREGULAR HEART BEAT . Bilateral leg edema   WEARS SUPPORT HOSE . Cancer ([place])   SKIN CANCERS . CKD (chronic kidney disease) stage 3, GFR 30-59 ml/min 2014  AKI 01/2015 during admission with obstructing,  gangrenouos ventral hernia.   Marland Kitchen COPD (chronic obstructive pulmonary disease) (HCC)   120 pack year hx.  Followed by DR.  Winona Legato [place]  . GERD (gastroesophageal reflux disease)  . H/O hiatal hernia  . History of kidney stones   SOME SMALL STONES AT PRESENT TIME- TAKES POTASSIUM CITRATE . Pneumonia   prior hx.  Aspiration PNA 01/2017 required bronch and evacutation secretions.  . Pulmonary embolus ([place]) 2011 OR 2012  CHRONIC WARFARIN SINCE THE BLOOD CLOT . Shortness of breath   WITH ANY EXERTION-USES OXYGEN AS NEEDED . Sleep apnea   USES BIPAP  SETTING 16 / 11 - PER PULMONOGIST'S OFFICE NOTE Past Surgical History: Past Surgical History: Procedure Laterality Date . ESOPHAGOGASTRODUODENOSCOPY (EGD) WITH PROPOFOL N/A 01/11/2017  Procedure: ESOPHAGOGASTRODUODENOSCOPY (EGD) WITH PROPOFOL;  Surgeon: Jerene Bears, MD;  Location: [facility] ENDOSCOPY;  Service: Endoscopy;  Laterality: N/A; . HEART ABLATION   . MASS REMOVED FROM STOMACH - BENIGN  ABOUT 2008 ? Marland Kitchen MULTIPLE TOOTH EXTRACTIONS   . TONSILLECTOMY    T & A - AS A CHILD . TOTAL KNEE ARTHROPLASTY Left 11/08/2012  LEFT TOTAL KNEE ARTHROPLASTY;  Mauri Pole, MD;  [facility] hospital;   . VENTRAL HERNIA REPAIR N/A 01/11/2015  STRANGULATED VENTRAL HERNIA REPAIR, SUBTOTAL OMENTECTOMY, SMALL BOWEL RESECTION X2, PLACEMENT OF NEGATIVE PRESSURE DRESSING; presented with gangrene and obstruction. Fanny Skates, MD; [facility] hospital; HPI: 81 year old male admitted 01/06/17 with coffee-ground emesis/GI bleed. PMH significant for COPD, GERD, aspiration PNA, OSA, arthritis, afib. Orders received for swallow evaluation with concern for silent aspiration, so MBS waas completed to objectively assess swallow function and safety. Subjective: Pt seen in radiology for MBS. Wife present Assessment / Plan / Recommendation CHL IP CLINICAL IMPRESSIONS 01/18/2017 Clinical Impression Pt presents with normal oral, mild pharyngeal dysphagia, characterized primarily by delayed swallow reflex - trigger noted at the pyriform sinus on thin liquids, and at the vallecula on other consistencies. Penetration of thin liquids was noted  inconsistently, and primarily with large consecutive boluses. No penetration was noted on small individual sips. The barium tablet was given with water, and was noted to pause in the vallecula, requiring additional bolus of water to clear. Will recommend soft diet and chopped meats, thin liquids (SMALL boluses) and pills one at a time with liquid. Safe swallow precautions posted at [facility] and reviewed with pt/wife. ST will continue to follow to assess diet tolerance and complete education.  SLP Visit Diagnosis Dysphagia, pharyngeal phase (R13.13) Impact on safety and function Mild aspiration risk   CHL IP TREATMENT RECOMMENDATION 01/18/2017 Treatment Recommendations Therapy as outlined in treatment plan below   Prognosis 01/18/2017 Prognosis for Safe Diet Advancement Good CHL IP DIET RECOMMENDATION 01/18/2017 SLP Diet Recommendations Dysphagia 3 (Mech soft) solids;Thin liquid Liquid Administration via Straw;Cup Medication Administration Whole meds with liquid Compensations Minimize environmental distractions;Slow rate;Small sips/bites Postural Changes Remain semi-upright after after feeds/meals (Comment);Seated upright at 90 degrees   CHL IP OTHER RECOMMENDATIONS 01/18/2017 Oral Care Recommendations Oral care BID   No flowsheet data found.  CHL IP FREQUENCY AND DURATION 01/18/2017 Speech Therapy Frequency (ACUTE ONLY) min 1 x/week Treatment Duration 1 week;2 weeks      CHL IP ORAL PHASE 01/18/2017 Oral Phase WFL  CHL IP PHARYNGEAL PHASE 01/18/2017 Pharyngeal Phase Impaired Pharyngeal- Thin Straw Delayed swallow initiation-pyriform sinuses;Reduced airway/laryngeal closure;Penetration/Aspiration during swallow Pharyngeal Material does not enter airway;Material enters airway, remains ABOVE vocal cords then ejected out;Material enters airway, remains ABOVE vocal cords and not ejected out Pharyngeal- Puree  Delayed swallow initiation-vallecula Pharyngeal- Regular Delayed swallow initiation-vallecula Pharyngeal- Pill Delayed swallow  initiation-vallecula  CHL IP CERVICAL ESOPHAGEAL PHASE 01/18/2017 Cervical Esophageal Phase [facility] Celia B. [place], MSP, CCC-SLP Shonna Chock 01/18/2017, 3:51 PM               Time Spent in minutes  35   Louellen Molder M.D on 01/19/2017 at 12:47 PM  Between 7am to 7pm - Pager - [phone]  After 7pm go to [email or web address] - password [facility]  [facility] -  Office  [phone]

## 2017-01-19 NOTE — Progress Notes (Signed)
PULMONARY / CRITICAL CARE MEDICINE   Name: Quanah Majka MRN:   [id number] DOB:   October 03, 1935             ADMISSION DATE:  01/06/2017 CONSULTATION DATE:  01/10/2017  REFERRING MD:  Dr. Rogers Blocker  CHIEF COMPLAINT:  Chest x-ray abnormality  BRIEF SUMMARY:  81 y/o M admitted 7/4 with coffee ground emesis.  Pt was pending an EGD.  He had a CXR that showed R sided opacification which prompted pulmonary evaluation.  He improved with chest PT and mucomyst.  Underwent bronchoscopy on 7/8 with removal of thick mucoid secretions.    PMH - A. fib, PE on Coumadin, chronic CHF.   SUBJECTIVE:   Feels better. Did not tolerate the BIPAP mask last night. Seems like it was a fit issue as well as a pressure issue. Objective   BP 130/79 (BP Location: Left Wrist)   Pulse 73   Temp 98.3 F (36.8 C) (Axillary)   Resp 16   Ht 6' (1.829 m)   Wt 240 lb 11.9 oz (109.2 kg)   SpO2 94%   BMI 32.65 kg/m   8 liters HF   Intake/Output Summary (Last 24 hours) at 01/19/17 0901 Last data filed at 01/19/17 0600  Gross per 24 hour  Intake                0 ml  Output             1925 ml  Net            -1925 ml    Physical Exam  Constitutional: He is oriented to person, place, and time. He is cooperative.  Non-toxic appearance. No distress. Nasal cannula in place.  HENT:  Head: Normocephalic and atraumatic.  Eyes: Pupils are equal, round, and reactive to light. Conjunctivae are normal.  Neck: No JVD present.  Cardiovascular: Normal heart sounds and normal pulses.  A regularly irregular rhythm present.  Pulmonary/Chest: No accessory muscle usage. No respiratory distress. He has decreased breath sounds in the right lower field. He has rhonchi.  Abdominal: Soft. Normal appearance. There is no tenderness.  Musculoskeletal:       Right shoulder: He exhibits decreased range of motion.       Left shoulder: He exhibits decreased range of motion.       Right wrist: He exhibits decreased range of motion.        Left wrist: He exhibits decreased range of motion.  Pain remarkably better He is actually able to lift arm off bed w/ own power today   Neurological: He is alert and oriented to person, place, and time.  Skin: Skin is warm and dry. Capillary refill takes less than 2 seconds.  Psychiatric: He has a normal mood and affect.     CBC Recent Labs     01/16/17  1506  01/18/17  0329  WBC  8.6  8.8  HGB  9.8*  8.9*  HCT  32.1*  29.5*  PLT  187  182    Coag's Recent Labs     01/17/17  0345  01/18/17  0329  01/19/17  0309  INR  1.54  1.55  1.50    BMET Recent Labs     01/17/17  0345  01/18/17  0329  01/19/17  0309  NA  139  137  137  K  3.7  3.4*  3.5  CL  95*  93*  92*  CO2  36*  36*  35*  BUN  28*  30*  37*  CREATININE  1.55*  1.58*  1.51*  GLUCOSE  115*  112*  135*    Electrolytes Recent Labs     01/17/17  0345  01/18/17  0329  01/19/17  0309  CALCIUM  8.3*  8.3*  8.5*    Sepsis Markers Recent Labs     01/17/17  0537  PROCALCITON  0.23    ABG No results for input(s): PHART, PCO2ART, PO2ART in the last 72 hours.  Liver Enzymes No results for input(s): AST, ALT, ALKPHOS, BILITOT, ALBUMIN in the last 72 hours.  Cardiac Enzymes No results for input(s): TROPONINI, PROBNP in the last 72 hours.  Glucose No results for input(s): GLUCAP in the last 72 hours.  Imaging Dg Chest Port 1 View  Result Date: 01/18/2017 CLINICAL DATA:  Respiratory failure EXAM: PORTABLE CHEST 1 VIEW COMPARISON:  January 15, 2017 FINDINGS: There are persistent pleural effusions bilaterally, larger on the right than on the left. There is patchy airspace consolidation in both lower lung zone regions. There is cardiomegaly with pulmonary vascularity within normal limits. No adenopathy. There is extensive arthropathy in the right shoulder, stable. There is aortic atherosclerosis. IMPRESSION: Persistent cardiomegaly with bilateral pleural effusions. Likely degree of pulmonary vascular  congestion. Consolidation in lower lung zone regions is likely due to superimposed pneumonia, although a degree of atelectasis and possibly alveolar edema may be contributory. There is aortic atherosclerosis. Aortic Atherosclerosis (ICD10-I70.0). Electronically Signed   By: Lowella Grip III M.D.   On: 01/18/2017 09:52   Dg Swallowing Func-speech Pathology  Result Date: 01/18/2017 Objective Swallowing Evaluation: Type of Study: MBS-Modified Barium Swallow Study Patient Details Name: Gary Boyer MRN: [id number] Date of Birth: 1936-01-23 Today's Date: 01/18/2017 Time: SLP Start Time (ACUTE ONLY): 1500-SLP Stop Time (ACUTE ONLY): 1530 SLP Time Calculation (min) (ACUTE ONLY): 30 min Past Medical History: Past Medical History: Diagnosis Date . Arthritis   ALL OVER . Asthma  . Atrial fibrillation ([place])   PAST HX OF HEART ABLATION FOR IRREGULAR HEART RATE --SUCCESSFUL-NO FURTHER PROB WITH IRREGULAR HEART BEAT . Bilateral leg edema   WEARS SUPPORT HOSE . Cancer ([place])   SKIN CANCERS . CKD (chronic kidney disease) stage 3, GFR 30-59 ml/min 2014  AKI 01/2015 during admission with obstructing, gangrenouos ventral hernia.   Marland Kitchen COPD (chronic obstructive pulmonary disease) ([facility])   120 pack year hx.  Followed by DR. Winona Legato [place]  . GERD (gastroesophageal reflux disease)  . H/O hiatal hernia  . History of kidney stones   SOME SMALL STONES AT PRESENT TIME- TAKES POTASSIUM CITRATE . Pneumonia   prior hx.  Aspiration PNA 01/2017 required bronch and evacutation secretions.  . Pulmonary embolus ([place]) 2011 OR 2012  CHRONIC WARFARIN SINCE THE BLOOD CLOT . Shortness of breath   WITH ANY EXERTION-USES OXYGEN AS NEEDED . Sleep apnea   USES BIPAP  SETTING 16 / 11 - PER PULMONOGIST'S OFFICE NOTE Past Surgical History: Past Surgical History: Procedure Laterality Date . ESOPHAGOGASTRODUODENOSCOPY (EGD) WITH PROPOFOL N/A 01/11/2017  Procedure: ESOPHAGOGASTRODUODENOSCOPY (EGD) WITH PROPOFOL;  Surgeon: Jerene Bears, MD;  Location: [facility]  ENDOSCOPY;  Service: Endoscopy;  Laterality: N/A; . HEART ABLATION   . MASS REMOVED FROM STOMACH - BENIGN  ABOUT 2008 ? Marland Kitchen MULTIPLE TOOTH EXTRACTIONS   . TONSILLECTOMY    T & A - AS A CHILD . TOTAL KNEE ARTHROPLASTY Left 11/08/2012  LEFT TOTAL KNEE ARTHROPLASTY;  Mauri Pole, MD;  [facility] hospital;   .  VENTRAL HERNIA REPAIR N/A 01/11/2015  STRANGULATED VENTRAL HERNIA REPAIR, SUBTOTAL OMENTECTOMY, SMALL BOWEL RESECTION X2, PLACEMENT OF NEGATIVE PRESSURE DRESSING; presented with gangrene and obstruction. Fanny Skates, MD; [facility] hospital; HPI: 81 year old male admitted 01/06/17 with coffee-ground emesis/GI bleed. PMH significant for COPD, GERD, aspiration PNA, OSA, arthritis, afib. Orders received for swallow evaluation with concern for silent aspiration, so MBS waas completed to objectively assess swallow function and safety. Subjective: Pt seen in radiology for MBS. Wife present Assessment / Plan / Recommendation CHL IP CLINICAL IMPRESSIONS 01/18/2017 Clinical Impression Pt presents with normal oral, mild pharyngeal dysphagia, characterized primarily by delayed swallow reflex - trigger noted at the pyriform sinus on thin liquids, and at the vallecula on other consistencies. Penetration of thin liquids was noted inconsistently, and primarily with large consecutive boluses. No penetration was noted on small individual sips. The barium tablet was given with water, and was noted to pause in the vallecula, requiring additional bolus of water to clear. Will recommend soft diet and chopped meats, thin liquids (SMALL boluses) and pills one at a time with liquid. Safe swallow precautions posted at [facility] and reviewed with pt/wife. ST will continue to follow to assess diet tolerance and complete education.  SLP Visit Diagnosis Dysphagia, pharyngeal phase (R13.13) Impact on safety and function Mild aspiration risk   CHL IP TREATMENT RECOMMENDATION 01/18/2017 Treatment Recommendations Therapy as outlined in treatment plan below   Prognosis  01/18/2017 Prognosis for Safe Diet Advancement Good CHL IP DIET RECOMMENDATION 01/18/2017 SLP Diet Recommendations Dysphagia 3 (Mech soft) solids;Thin liquid Liquid Administration via Straw;Cup Medication Administration Whole meds with liquid Compensations Minimize environmental distractions;Slow rate;Small sips/bites Postural Changes Remain semi-upright after after feeds/meals (Comment);Seated upright at 90 degrees   CHL IP OTHER RECOMMENDATIONS 01/18/2017 Oral Care Recommendations Oral care BID   No flowsheet data found.  CHL IP FREQUENCY AND DURATION 01/18/2017 Speech Therapy Frequency (ACUTE ONLY) min 1 x/week Treatment Duration 1 week;2 weeks      CHL IP ORAL PHASE 01/18/2017 Oral Phase WFL  CHL IP PHARYNGEAL PHASE 01/18/2017 Pharyngeal Phase Impaired Pharyngeal- Thin Straw Delayed swallow initiation-pyriform sinuses;Reduced airway/laryngeal closure;Penetration/Aspiration during swallow Pharyngeal Material does not enter airway;Material enters airway, remains ABOVE vocal cords then ejected out;Material enters airway, remains ABOVE vocal cords and not ejected out Pharyngeal- Puree Delayed swallow initiation-vallecula Pharyngeal- Regular Delayed swallow initiation-vallecula Pharyngeal- Pill Delayed swallow initiation-vallecula  CHL IP CERVICAL ESOPHAGEAL PHASE 01/18/2017 Cervical Esophageal Phase [facility] Celia B. [place], MSP, CCC-SLP Shonna Chock 01/18/2017, 3:51 PM               ASSESSMENT / PLAN:  Acute hypoxic respiratory failure in setting recurrent mucous plugging and right sided atelectasis/collapse  -Remote Pulmonary emboli (2011 or 2012) -r/o dysphagia/aspiration  -completed 7d unasyn .  -used BIPAP last night -afebrile pcxr (personally reviewed)-->still w/ persistent atx but improved aeration on 7/17 c/w 16th -I continue to believe that his over-all deconditioning, pain, poor cough mechanics, and nutritional status w/ really what equates to failure to thrive is the etiology of his recurrent  decline. -we added steroids for his gout flare. This has resulted in decrease pain and improved ability to participate in pulm hygiene measures.    Plan:   Cont to wean O2 Cont chest vest OOB Cont duoneb, brovana/budesonide and hypertonic saline  F/u sputum  GIB w/ acute blood loss anemia d/t esophagitis, vascular ectasias & supra therapeutic INR  ->EGD completed 7/9  ->required 4 units PRBC, 2 unit FFP since admit ->no evidence of  re-bleed Dysphagia  Plan:  Dysphagia precautions per SLP PPI BID  Gout flare ->remarkable improvement after starting steroids 7/16 Plan Cont pred  A. Fib - CHADS VASC 4 Chronic diastolic CHF Plan:  Tele Holding ac for total of 8 weeks of PPI given UGIB   CKD4 w/ acute on chronic renal failure  Cr stable ~ 1.5 Plan:   Cont lasix as able  He looks much better. Weaning high flow oxygen Would cont current rx.  We will be available as needed Discussed plan w/ primary team   Erick Colace ACNP-BC [place] Pager # [phone] OR # [phone] if no answer

## 2017-01-20 LAB — PROTIME-INR
INR: 1.33
PROTHROMBIN TIME: 16.5 s — AB (ref 11.4–15.2)

## 2017-01-20 LAB — BASIC METABOLIC PANEL
Anion gap: 11 (ref 5–15)
BUN: 44 mg/dL — ABNORMAL HIGH (ref 6–20)
CALCIUM: 8.3 mg/dL — AB (ref 8.9–10.3)
CHLORIDE: 95 mmol/L — AB (ref 101–111)
CO2: 32 mmol/L (ref 22–32)
CREATININE: 1.5 mg/dL — AB (ref 0.61–1.24)
GFR, EST AFRICAN AMERICAN: 49 mL/min — AB (ref >60)
GFR, EST NON AFRICAN AMERICAN: 43 mL/min — AB (ref >60)
Glucose, Bld: 124 mg/dL — ABNORMAL HIGH (ref 65–99)
Potassium: 3.1 mmol/L — ABNORMAL LOW (ref 3.5–5.1)
SODIUM: 138 mmol/L (ref 135–145)

## 2017-01-20 LAB — CBC
HCT: 29 % — ABNORMAL LOW (ref 39.0–52.0)
HEMOGLOBIN: 8.9 g/dL — AB (ref 13.0–17.0)
MCH: 23.6 pg — ABNORMAL LOW (ref 26.0–34.0)
MCHC: 30.7 g/dL (ref 30.0–36.0)
MCV: 76.9 fL — ABNORMAL LOW (ref 78.0–100.0)
Platelets: 193 10*3/uL (ref 150–400)
RBC: 3.77 MIL/uL — ABNORMAL LOW (ref 4.22–5.81)
RDW: 19.1 % — ABNORMAL HIGH (ref 11.5–15.5)
WBC: 9.5 10*3/uL (ref 4.0–10.5)

## 2017-01-20 MED ORDER — POTASSIUM CHLORIDE CRYS ER 20 MEQ PO TBCR
40.0000 meq | EXTENDED_RELEASE_TABLET | Freq: Once | ORAL | Status: AC
Start: 2017-01-20 — End: 2017-01-20
  Administered 2017-01-20: 40 meq via ORAL
  Filled 2017-01-20: qty 2

## 2017-01-20 NOTE — Progress Notes (Signed)
Physical Therapy Treatment Patient Details Name: Gary Boyer MRN: [id number] DOB: 1935-09-04 Today's Date: 01/20/2017    History of Present Illness Pt admitted from home with upper GIB/anemia and hx of COPD, LE edema, PE, L TKR and falls.  Pt also with acute hypoxic respiratory failure in setting recurrent mucous plugging and right sided atelectasis/collapse     PT Comments    Able to initiate standing trials with use of STEDY this session. Pt tolerated activity fairly well although he remains weak and fatigues easily. Bowel incontinence at start of session requiring total assist for hygiene. Pt also c/o bil hand and shoulder deficits, difficulties with ADLs. Will place an order for an OT consult to help with UE exercises and ADL training. Continue to recommend SNF.     Follow Up Recommendations  SNF     Equipment Recommendations   (TBD at next venue)    Recommendations for Other Services OT consult     Precautions / Restrictions Precautions Precautions: Fall Precaution Comments: bowel incontinence Restrictions Weight Bearing Restrictions: No    Mobility  Bed Mobility Overal bed mobility: Needs Assistance Bed Mobility: Rolling;Supine to Sit Rolling: Min assist   Supine to sit: Mod assist;+2 for physical assistance;+2 for safety/equipment;HOB elevated     General bed mobility comments: Increased time. Assist for trunk and bil LEs. Utilized bedpad to aid with scooting as well. Sat EOB for several minutes due to lightheadedness  Transfers Overall transfer level: Needs assistance   Transfers: Sit to/from Stand Sit to Stand: Mod assist;+2 physical assistance;+2 safety/equipment;From elevated surface         General transfer comment: Assist to rise, stabilize, control descent. Stood x 2- once from elevated bed, once from [employer] seat. Pt was able to stand for ~15-20 seconds before fatiguing. Pt reports L LE is weak and tires quickly.   Ambulation/Gait              General Gait Details: NT-pt not yet able to safely attempt   Stairs            Wheelchair Mobility    Modified Rankin (Stroke Patients Only)       Balance Overall balance assessment: Needs assistance Sitting-balance support: Bilateral upper extremity supported;Feet supported Sitting balance-Leahy Scale: Fair                                      Cognition Arousal/Alertness: Awake/alert Behavior During Therapy: WFL for tasks assessed/performed Overall Cognitive Status: Within Functional Limits for tasks assessed                                        Exercises      General Comments        Pertinent Vitals/Pain Pain Assessment: Faces Faces Pain Scale: Hurts little more Pain Location: bil hands, back Pain Descriptors / Indicators: Aching;Discomfort;Sore Pain Intervention(s): Limited activity within patient's tolerance;Repositioned    Home Living                      Prior Function            PT Goals (current goals can now be found in the care plan section) Progress towards PT goals: Progressing toward goals    Frequency    Min 3X/week      PT  Plan Current plan remains appropriate    Co-evaluation              AM-PAC PT "6 Clicks" Daily Activity  Outcome Measure  Difficulty turning over in bed (including adjusting bedclothes, sheets and blankets)?: Total Difficulty moving from lying on back to sitting on the side of the bed? : Total Difficulty sitting down on and standing up from a chair with arms (e.g., wheelchair, bedside commode, etc,.)?: Total Help needed moving to and from a bed to chair (including a wheelchair)?: Total Help needed walking in hospital room?: Total Help needed climbing 3-5 steps with a railing? : Total 6 Click Score: 6    End of Session Equipment Utilized During Treatment: Oxygen Activity Tolerance: Patient limited by fatigue Patient left: in chair;with call bell/phone  within reach   PT Visit Diagnosis: Muscle weakness (generalized) (M62.81);Difficulty in walking, not elsewhere classified (R26.2)     Time: 7867-6720 PT Time Calculation (min) (ACUTE ONLY): 31 min  Charges:  $Therapeutic Activity: 23-37 mins                    G Codes:          Weston Anna, MPT Pager: [phone]

## 2017-01-20 NOTE — Progress Notes (Signed)
PROGRESS NOTE                                                                                                                                                                                                             Patient Demographics:    Gary Boyer, is a 81 y.o. male, DOB - 1935/09/19, HGD:[id number]  Admit date - 01/06/2017   Admitting Physician Lavina Hamman, MD  Outpatient Primary MD for the patient is [name], MD  LOS - 14  Outpatient Specialists:None  Chief Complaint  Patient presents with  . Emesis       Brief Narrative   81 year old male with history of A. fib, PE on Coumadin, chronic CHF, COPD on home O2 presented to [facility] long EDD with coffee-ground emesis restarted on the night prior to admission. Patient was recently treated with penicillin and doxycycline for UTI. On admission his hemoglobin was 7 with positive FOBT and he was hypotensive. His INR was supratherapeutic. Patient received a total of 4 unit PRBC and 2 units of FFP. He was also in respiratory failure requiring Ventimask and nasal cannula. Patient was scheduled for EGD but chest x-ray done for his dyspnea symptoms showed right-sided opacification which required bronchoscopy on 7/8 with removal of thick mucoid secretion. He then had EGD on 7/9 showing esophagitis in the lower third of esophagus with contact bleeding which stopped spontaneously.. Also had three 4-7 mm bleeding angiodysplastic lesion in the stomach out of which one actively bleeding and was treated followed by hemostatic clips applied.  Patient also was treated for aspiration pneumonia with Unasyn.  Given persistent requirement for high flow nasal cannula and risk for intubation he was monitored under critical care service and transferred back to hospitalist service on 7/14.    Subjective:     Assessment  & Plan :    Principal Problem: Acute respiratory failure with  hypoxia -Likely combination of acute lung injury, COPD exacerbation and aspiration pneumonia on presentation. Chest x-ray on 7/7 showed complete opacification right hemithorax requiring bronchoscopy and removal of mucous plug. Continue chest PT. -Pulmonary consult appreciated. Continue Pulmicort and Brovana twice a day along with scheduled meds. Added hypertonic saline nebs. Continue pulmonary hygiene. Seen by SLP and dysphagia level III diet. -Chest x-ray shows persistent bilateral pulmonary infiltrates/edema. Marland Kitchenalso noted increased densities in both lung apices. -  Continue current dose of IV Lasix. Patient now on 5 L oxygen.  Wean oxygen as tolerated.    Acute upper GI bleed With acute blood loss anemia Secondary to bleeding angiodysplastic gastric lesions and distal esophagitis in the setting of supratherapeutic INR. Received 4 units PRBC and 2 units FFP.Marland Kitchen EGD findings as above. Needs twice daily PPI for at least 8-12 weeks and once daily thereafter given large hiatal hernia.   PCCM  discussed with patient and family and recommend resuming anticoagulation after 8 weeks. (Family agree). Hb stable.   Acute gouty arthritis Involving left hand and wrist. Continue with prednisone taper.   Pulmonary embolism (HCC) Anticoagulation was on hold at Admit  due to active bleeding. After discussing with patient and family on risk versus benefit, decided on holding anticoagulation for 8 weeks.  Acute on Chronic diastolic CHF Chest x-ray still shows significant sign of CHF, edema.  Continue with IV lasix. Replace potasium.   Chronic kidney disease stage 2-3 Renal function at baseline (1.5-1.8). Continue IV Lasix.     Atrial fibrillation ([place]) Rate controlled. Off anticoagulation. Monitor on telemetry.    Malnutrition of moderate degree Added protein supplement.  Chronic depression Continue Zoloft  Insomnia  continue trazodone at bedtime  Aspiration pneumonia of right lower lobe  (HCC) Completed 7 days of Unasyn. Seen by SLP and had MBS on 7/16. Has pharyngeal dysphagia with aspiration risk. On dysphagia level III diet  COPD exacerbation Continue nebs.      Code Status : DO NOT RESUSCITATE  Family Communication  : None at bedside  Disposition Plan  : Needs SNF, discharge in 48 hours.   Barriers For Discharge : Active symptoms  Consults  :   Pulmonary [facility]  Procedures  :  EGD Bronchoscopy  DVT Prophylaxis  :  SCDs  Lab Results  Component Value Date   PLT 193 01/20/2017    Antibiotics  :   Anti-infectives    Start     Dose/Rate Route Frequency Ordered Stop   01/06/17 1200  Ampicillin-Sulbactam (UNASYN) 3 g in sodium chloride 0.9 % 100 mL IVPB     3 g 200 mL/hr over 30 Minutes Intravenous Every 6 hours 01/06/17 1003 01/12/17 2359        Objective:   Vitals:   01/20/17 0500 01/20/17 0532 01/20/17 0600 01/20/17 0608  BP:   (!) 154/95   Pulse: 69 (!) 55  72  Resp: 20 18  (!) 23  Temp:      TempSrc:      SpO2: 100% 98%  96%  Weight:      Height:        Wt Readings from Last 3 Encounters:  01/19/17 109.2 kg (240 lb 11.9 oz)  01/15/15 133 kg (293 lb 3.4 oz)  11/08/12 (!) 139.3 kg (307 lb 1.6 oz)     Intake/Output Summary (Last 24 hours) at 01/20/17 0724 Last data filed at 01/20/17 0600  Gross per 24 hour  Intake             1440 ml  Output             1550 ml  Net             -110 ml     Physical Exam Gen.: elderly male in no acute distress. 5 L oxygen  HEENT:  neck supple.  Chest: normal respiratory effort, bilateral crackles, no wheezing.  CVS: S 1, S 2 RRR GI: Soft, NT, ND  Musculoskeletal: left hand swelling decreasing.      Data Review:    CBC  Recent Labs Lab 01/14/17 0300 01/16/17 1506 01/18/17 0329 01/20/17 0249  WBC 5.8 8.6 8.8 9.5  HGB 8.8* 9.8* 8.9* 8.9*  HCT 29.9* 32.1* 29.5* 29.0*  PLT 160 187 182 193  MCV 82.6 79.9 78.9 76.9*  MCH 24.3* 24.4* 23.8* 23.6*  MCHC 29.4* 30.5 30.2 30.7   RDW 19.8* 19.7* 19.8* 19.1*    Chemistries   Recent Labs Lab 01/16/17 0346 01/17/17 0345 01/18/17 0329 01/19/17 0309 01/20/17 0249  NA 141 139 137 137 138  K 3.8 3.7 3.4* 3.5 3.1*  CL 94* 95* 93* 92* 95*  CO2 36* 36* 36* 35* 32  GLUCOSE 106* 115* 112* 135* 124*  BUN 27* 28* 30* 37* 44*  CREATININE 1.43* 1.55* 1.58* 1.51* 1.50*  CALCIUM 8.7* 8.3* 8.3* 8.5* 8.3*   ------------------------------------------------------------------------------------------------------------------ No results for input(s): CHOL, HDL, LDLCALC, TRIG, CHOLHDL, LDLDIRECT in the last 72 hours.  No results found for: HGBA1C ------------------------------------------------------------------------------------------------------------------ No results for input(s): TSH, T4TOTAL, T3FREE, THYROIDAB in the last 72 hours.  Invalid input(s): FREET3 ------------------------------------------------------------------------------------------------------------------ No results for input(s): VITAMINB12, FOLATE, FERRITIN, TIBC, IRON, RETICCTPCT in the last 72 hours.  Coagulation profile  Recent Labs Lab 01/16/17 0346 01/17/17 0345 01/18/17 0329 01/19/17 0309 01/20/17 0249  INR 1.70 1.54 1.55 1.50 1.33    No results for input(s): DDIMER in the last 72 hours.  Cardiac Enzymes No results for input(s): CKMB, TROPONINI, MYOGLOBIN in the last 168 hours.  Invalid input(s): CK ------------------------------------------------------------------------------------------------------------------    Component Value Date/Time   BNP 306.7 (H) 01/14/2017 0300    Inpatient Medications  Scheduled Meds: . arformoterol  15 mcg Nebulization BID  . budesonide  0.5 mg Nebulization BID  . fentaNYL (SUBLIMAZE) injection  100 mcg Intravenous Once  . folic acid  1 mg Oral Daily  . furosemide  80 mg Intravenous Daily  . ipratropium-albuterol  3 mL Nebulization QID  . lidocaine  1 application Other Once  . mouth rinse  15 mL  Mouth Rinse BID  . multivitamin with minerals  1 tablet Oral Daily  . nystatin  5 mL Oral QID  . pantoprazole  40 mg Oral BID  . potassium chloride  40 mEq Oral Once  . predniSONE  40 mg Oral Q breakfast  . protein supplement shake  11 oz Oral Q24H  . sertraline  100 mg Oral QHS  . sodium chloride flush  3 mL Intravenous Q12H  . sodium chloride HYPERTONIC  4 mL Nebulization Daily   Continuous Infusions: PRN Meds:.acetaminophen **OR** acetaminophen, ipratropium-albuterol, [DISCONTINUED] ondansetron **OR** ondansetron (ZOFRAN) IV, polyethylene glycol, sodium bicarbonate/sodium chloride, sodium chloride, traZODone  Micro Results Recent Results (from the past 240 hour(s))  Culture, respiratory (NON-Expectorated)     Status: None   Collection Time: 01/10/17  5:30 PM  Result Value Ref Range Status   Specimen Description BRONCHIAL ALVEOLAR LAVAGE RIGHT  Final   Special Requests NONE  Final   Gram Stain   Final    MODERATE WBC PRESENT, PREDOMINANTLY PMN NO ORGANISMS SEEN    Culture   Final    Consistent with normal respiratory flora. Performed at [facility], [place] [address] [place], [place] [address]    Report Status 01/13/2017 FINAL  Final    Radiology Reports Ct Chest Wo Contrast  Result Date: 01/06/2017 CLINICAL DATA:  81 y.o. male with Past medical history of A. fib, PE, chronic warfarin use, chronic CHF,  chronic respiratory failure with hypoxia, COPD. Patient presents with complaints of coffee-ground emesis started overnight. Has poor appetite and an unintentional weight loss. EXAM: CT CHEST WITHOUT CONTRAST TECHNIQUE: Multidetector CT imaging of the chest was performed following the standard protocol without IV contrast. COMPARISON:  CT of the abdomen and pelvis 01/09/2014 and CT of the chest 10/01/2010 FINDINGS: Cardiovascular: The heart is enlarged. There is extensive coronary artery calcification. No pericardial effusion. Significant atherosclerosis of the thoracic aorta.  Aortic arch is atherosclerotic. Main pulmonary artery is prominent, main pulmonary artery is enlarged, measuring 4.1 cm. Mediastinum/Nodes: The visualized portion of the thyroid gland has a normal appearance. Large hiatal hernia. Lungs/Pleura: Right pleural effusion and right lower lobe consolidation. There is vascular crowding at both lung bases. There are pleural calcifications. Mild airspace filling is identified within the right upper lobe. Cavitary lesion identified in the left lung apex measuring 1.6 by 4.1 cm. This is smaller compared to 2012 There are paraseptal emphysematous changes in the lung apices bilaterally. Upper Abdomen: There is cirrhotic contour of the liver. Spleen appears enlarged but is only partially imaged. Indeterminate mass is identified in the midpole region of the left kidney, measuring 4.1 cm. There are intrarenal calcifications bilaterally. Adrenal glands are normal in appearance. Musculoskeletal: Degenerative changes are seen in the thoracic spine. IMPRESSION: 1. Cardiomegaly, coronary artery disease and thoracic atherosclerosis. 2. Enlarged pulmonary artery, consistent with pulmonary arterial hypertension. 3. Right pleural effusion. Airspace filling opacities, consistent with infectious process in the right lower lobe and upper lobes. 4. Pleural calcifications, consistent with asbestos exposure. 5. Cavitary lesion in the left upper lobe. 6. Paraseptal emphysema. 7. Large hiatal hernia. 8. Cirrhosis of the liver. 9. Suspect splenomegaly and portal venous hypertension. 10. Left renal mass. Based on previous imaging, this likely represented a cyst but by today's imaging is indeterminate. Consider renal ultrasound for further characterization. 11. Bilateral intrarenal calculi. Aortic Atherosclerosis (ICD10-I70.0) and Emphysema (ICD10-J43.9). Electronically Signed   By: Nolon Nations M.D.   On: 01/06/2017 16:23   Dg Chest Port 1 View  Result Date: 01/19/2017 CLINICAL DATA:  Fever.   Coughing . EXAM: PORTABLE CHEST 1 VIEW COMPARISON:  01/18/2017. FINDINGS: Persistent cardiomegaly. Bilateral pulmonary infiltrates and/or edema again noted. Bilateral pleural effusions again noted . Increase densities noted over both pulmonary apices, possibly overlying vascular structures and or new infiltrates. IMPRESSION: Persistent cardiomegaly. Persistent bilateral pulmonary infiltrates and/or edema again noted. Bilateral pleural effusions again noted. CHF could present this fashion. Associated pneumonia cannot be excluded. 2. Increased densities noted over both pulmonary apices, possibly overlying vascular structures and/or new infiltrates. Electronically Signed   By: Marcello Moores  Register   On: 01/19/2017 09:15   Dg Chest Port 1 View  Result Date: 01/18/2017 CLINICAL DATA:  Respiratory failure EXAM: PORTABLE CHEST 1 VIEW COMPARISON:  January 15, 2017 FINDINGS: There are persistent pleural effusions bilaterally, larger on the right than on the left. There is patchy airspace consolidation in both lower lung zone regions. There is cardiomegaly with pulmonary vascularity within normal limits. No adenopathy. There is extensive arthropathy in the right shoulder, stable. There is aortic atherosclerosis. IMPRESSION: Persistent cardiomegaly with bilateral pleural effusions. Likely degree of pulmonary vascular congestion. Consolidation in lower lung zone regions is likely due to superimposed pneumonia, although a degree of atelectasis and possibly alveolar edema may be contributory. There is aortic atherosclerosis. Aortic Atherosclerosis (ICD10-I70.0). Electronically Signed   By: Lowella Grip III M.D.   On: 01/18/2017 09:52   Dg Chest [facility]  Result Date: 01/15/2017 CLINICAL DATA:  Pneumonia, history of asthma EXAM: PORTABLE CHEST 1 VIEW COMPARISON:  01/14/2017 and prior FINDINGS: Improving aeration bilaterally. Residual bilateral effusions, small on the left and moderate on the right. Bibasilar  consolidations. Cardiomegaly with central vascular congestion but decreased compared to prior. IMPRESSION: 1. Improved aeration compared to most recent prior radiograph. 2. Bilateral effusions, small on the left and moderate on the right with bibasilar consolidations 3. Cardiomegaly with central vascular congestion Electronically Signed   By: Donavan Foil M.D.   On: 01/15/2017 04:02   Dg Chest Port 1 View  Result Date: 01/14/2017 CLINICAL DATA:  Followup pneumonia EXAM: PORTABLE CHEST 1 VIEW COMPARISON:  01/13/2017 FINDINGS: Marked radiographic worsening with subtotal collapse of the right lung due to consolidation, pleural fluid and volume loss. Mediastinal shift towards the right. Patchy infiltrate persists at the left lung base. Left upper lung as well aerated. IMPRESSION: Worsened infiltrate, effusion collapse of the right lung. Electronically Signed   By: Nelson Chimes M.D.   On: 01/14/2017 06:50   Dg Chest Port 1 View  Result Date: 01/13/2017 CLINICAL DATA:  Respiratory failure, hypoxia. EXAM: PORTABLE CHEST 1 VIEW COMPARISON:  Radiograph of January 12, 2017. FINDINGS: Stable cardiomegaly and central pulmonary vascular congestion. No pneumothorax is noted. Degenerative changes are seen involving both shoulders. Stable bilateral perihilar and basilar densities are noted concerning for edema or atelectasis with mild associated pleural effusions. Bony thorax is unremarkable. IMPRESSION: Stable bilateral lung opacities are noted concerning for edema or atelectasis with associated pleural effusions. Electronically Signed   By: Marijo Conception, M.D.   On: 01/13/2017 07:22   Dg Chest Port 1 View  Result Date: 01/12/2017 CLINICAL DATA:  Respiratory failure EXAM: PORTABLE CHEST 1 VIEW COMPARISON:  January 10, 2017 FINDINGS: There is a persistent right pleural effusion with consolidation in the right upper lobe and right base regions. There is mild left base atelectasis. Heart is mildly enlarged with pulmonary  vascularity within normal limits. There is aortic atherosclerosis. No adenopathy. There is arthropathy in each shoulder. IMPRESSION: Persistent right pleural effusion with areas of consolidation on the right, unchanged. There is stable left base atelectasis. Overall no new opacity. Stable cardiac prominence. There is aortic atherosclerosis. Aortic Atherosclerosis (ICD10-I70.0). Electronically Signed   By: Lowella Grip III M.D.   On: 01/12/2017 07:09   Dg Chest Port 1 View  Result Date: 01/10/2017 CLINICAL DATA:  Atelectasis.  Shortness of breath EXAM: PORTABLE CHEST 1 VIEW COMPARISON:  Study obtained earlier in the day and January 09, 2017 FINDINGS: There is stable consolidation in the right upper lobe and right base regions. There is a small right pleural effusion. There is also patchy atelectasis in the left base. No new opacity compared earlier in the day. Heart is mildly enlarged with pulmonary vascularity within normal limits. No evident adenopathy. There is arthropathy in each shoulder. IMPRESSION: Areas of consolidation and volume loss on the right. Small right pleural effusion. Patchy atelectasis left base. No new opacity compared to earlier in the day. Stable cardiac silhouette. Electronically Signed   By: Lowella Grip III M.D.   On: 01/10/2017 17:51   Dg Chest Port 1 View  Result Date: 01/10/2017 CLINICAL DATA:  Atelectasis.  Shortness breath. EXAM: PORTABLE CHEST 1 VIEW COMPARISON:  01/09/2017 FINDINGS: The left costophrenic angle is not included. Technologist note indicates: "ordering MD did not needed the image to be repeated to include costophrenic angles." There is significantly improved aeration in the right hemithorax.  Continued volume loss on the right with shift of cardiac and mediastinal structures to the right. This is difficult to quantify given the rightward rotation of the chest during imaging. Continued obscuration of the right hemidiaphragm possibly from layering effusion,  atelectasis, or pneumonia. Poor definition of the right mediastinal margin. Vague densities at the left lung base. Indistinct pulmonary vasculature. Potential enlargement of the cardiopericardial silhouette. Considerable degenerative arthropathy of the right glenohumeral joint. Reduced acromiohumeral distance on the right suspicious for chronic rotator cuff tear. IMPRESSION: 1. Interval improved aeration in the right hemithorax, but with continued considerable airspace opacity especially at the right lung base and adjacent to the right mediastinum. Cannot exclude layering right pleural effusion. 2. Indistinct airspace opacity in left lower lobe, potentially from edema or pneumonia. 3. Suspected cardiomegaly. Indistinct pulmonary vasculature suggesting pulmonary venous hypertension and potentially mild interstitial edema. 4. Chronic right rotator cuff tear with advanced degenerative glenohumeral arthropathy on the right. Electronically Signed   By: Van Clines M.D.   On: 01/10/2017 17:20   Dg Chest Port 1 View  Result Date: 01/09/2017 CLINICAL DATA:  Worsening shortness of breath. Question aspiration pneumonia. EXAM: PORTABLE CHEST 1 VIEW COMPARISON:  January 06, 2017 FINDINGS: There is complete opacification of the right hemithorax which is worsened in the interval. There is volume loss on the right with shift of the heart mediastinum. Diffuse interstitial opacities in the left lung have worsened. No other interval changes. No pneumothorax. IMPRESSION: 1. Complete opacification of the right hemithorax has worsened in the interval. Recommend clinical correlation and follow-up to resolution. 2. Diffuse interstitial opacities in the left lung are worse in the interval most consistent with edema. Electronically Signed   By: Dorise Bullion III M.D   On: 01/09/2017 09:37   Dg Abd Acute W/chest  Result Date: 01/06/2017 CLINICAL DATA:  Patient with back pain. Coffee-ground emesis. Shortness of breath. EXAM: DG  ABDOMEN ACUTE W/ 1V CHEST COMPARISON:  Chest radiograph 08/21/2015. FINDINGS: Patient is mildly rotated to the right. Stable enlarged cardiac and mediastinal contours. Hiatal hernia. There is a large area of consolidation throughout the right mid and lower lung. Left lung is clear. Possible small right pleural effusion. No pneumothorax. Gas is demonstrated within nondilated loops of large and small bowel in a nonobstructed pattern. No evidence for free intraperitoneal air. Marked degenerative changes of the lumbar spine. IMPRESSION: Nonobstructed bowel gas pattern. Patchy consolidation within the right mid and lower lung which may represent combination of atelectasis and scarring within the right lung base. Superimposed infectious process not excluded. Recommend further evaluation with chest CT to exclude underlying pulmonary mass. Electronically Signed   By: Lovey Newcomer M.D.   On: 01/06/2017 09:09   Dg Swallowing Func-speech Pathology  Result Date: 01/18/2017 Objective Swallowing Evaluation: Type of Study: MBS-Modified Barium Swallow Study Patient Details Name: Gary Boyer MRN: [id number] Date of Birth: [birth year] Today's Date: 01/18/2017 Time: SLP Start Time (ACUTE ONLY): 1500-SLP Stop Time (ACUTE ONLY): 1530 SLP Time Calculation (min) (ACUTE ONLY): 30 min Past Medical History: Past Medical History: Diagnosis Date . Arthritis   ALL OVER . Asthma  . Atrial fibrillation ([place])   PAST HX OF HEART ABLATION FOR IRREGULAR HEART RATE --SUCCESSFUL-NO FURTHER PROB WITH IRREGULAR HEART BEAT . Bilateral leg edema   WEARS SUPPORT HOSE . Cancer ([place])   SKIN CANCERS . CKD (chronic kidney disease) stage 3, GFR 30-59 ml/min 2014  AKI 01/2015 during admission with obstructing, gangrenouos ventral hernia.   Marland Kitchen COPD (chronic obstructive pulmonary  disease) ([place])   120 pack year hx.  Followed by DR. Winona Legato IN [place]  . GERD (gastroesophageal reflux disease)  . H/O hiatal hernia  . History of kidney stones   SOME SMALL STONES AT  PRESENT TIME- TAKES POTASSIUM CITRATE . Pneumonia   prior hx.  Aspiration PNA 01/2017 required bronch and evacutation secretions.  . Pulmonary embolus ([place]) 2011 OR 2012  CHRONIC WARFARIN SINCE THE BLOOD CLOT . Shortness of breath   WITH ANY EXERTION-USES OXYGEN AS NEEDED . Sleep apnea   USES BIPAP  SETTING 16 / 11 - PER PULMONOGIST'S OFFICE NOTE Past Surgical History: Past Surgical History: Procedure Laterality Date . ESOPHAGOGASTRODUODENOSCOPY (EGD) WITH PROPOFOL N/A 01/11/2017  Procedure: ESOPHAGOGASTRODUODENOSCOPY (EGD) WITH PROPOFOL;  Surgeon: Jerene Bears, MD;  Location: [facility] ENDOSCOPY;  Service: Endoscopy;  Laterality: N/A; . HEART ABLATION   . MASS REMOVED FROM STOMACH - BENIGN  ABOUT 2008 ? Marland Kitchen MULTIPLE TOOTH EXTRACTIONS   . TONSILLECTOMY    T & A - AS A CHILD . TOTAL KNEE ARTHROPLASTY Left 11/08/2012  LEFT TOTAL KNEE ARTHROPLASTY;  Mauri Pole, MD;  [facility] hospital;   . VENTRAL HERNIA REPAIR N/A 01/11/2015  STRANGULATED VENTRAL HERNIA REPAIR, SUBTOTAL OMENTECTOMY, SMALL BOWEL RESECTION X2, PLACEMENT OF NEGATIVE PRESSURE DRESSING; presented with gangrene and obstruction. Fanny Skates, MD; [facility] hospital; HPI: 81 year old male admitted 01/06/17 with coffee-ground emesis/GI bleed. PMH significant for COPD, GERD, aspiration PNA, OSA, arthritis, afib. Orders received for swallow evaluation with concern for silent aspiration, so MBS waas completed to objectively assess swallow function and safety. Subjective: Pt seen in radiology for MBS. Wife present Assessment / Plan / Recommendation CHL IP CLINICAL IMPRESSIONS 01/18/2017 Clinical Impression Pt presents with normal oral, mild pharyngeal dysphagia, characterized primarily by delayed swallow reflex - trigger noted at the pyriform sinus on thin liquids, and at the vallecula on other consistencies. Penetration of thin liquids was noted inconsistently, and primarily with large consecutive boluses. No penetration was noted on small individual sips. The barium tablet was given with  water, and was noted to pause in the vallecula, requiring additional bolus of water to clear. Will recommend soft diet and chopped meats, thin liquids (SMALL boluses) and pills one at a time with liquid. Safe swallow precautions posted at [facility] and reviewed with pt/wife. ST will continue to follow to assess diet tolerance and complete education.  SLP Visit Diagnosis Dysphagia, pharyngeal phase (R13.13) Impact on safety and function Mild aspiration risk   CHL IP TREATMENT RECOMMENDATION 01/18/2017 Treatment Recommendations Therapy as outlined in treatment plan below   Prognosis 01/18/2017 Prognosis for Safe Diet Advancement Good CHL IP DIET RECOMMENDATION 01/18/2017 SLP Diet Recommendations Dysphagia 3 ([place] soft) solids;Thin liquid Liquid Administration via Straw;Cup Medication Administration Whole meds with liquid Compensations Minimize environmental distractions;Slow rate;Small sips/bites Postural Changes Remain semi-upright after after feeds/meals (Comment);Seated upright at 90 degrees   CHL IP OTHER RECOMMENDATIONS 01/18/2017 Oral Care Recommendations Oral care BID   No flowsheet data found.  CHL IP FREQUENCY AND DURATION 01/18/2017 Speech Therapy Frequency (ACUTE ONLY) min 1 x/week Treatment Duration 1 week;2 weeks      CHL IP ORAL PHASE 01/18/2017 Oral Phase WFL  CHL IP PHARYNGEAL PHASE 01/18/2017 Pharyngeal Phase Impaired Pharyngeal- Thin Straw Delayed swallow initiation-pyriform sinuses;Reduced airway/laryngeal closure;Penetration/Aspiration during swallow Pharyngeal Material does not enter airway;Material enters airway, remains ABOVE vocal cords then ejected out;Material enters airway, remains ABOVE vocal cords and not ejected out Pharyngeal- Puree Delayed swallow initiation-vallecula Pharyngeal- Regular Delayed swallow initiation-vallecula Pharyngeal- Pill  Delayed swallow initiation-vallecula  CHL IP CERVICAL ESOPHAGEAL PHASE 01/18/2017 Cervical Esophageal Phase [facility] Celia B. Bueche, MSP, CCC-SLP Shonna Chock  01/18/2017, 3:51 PM               Time Spent in minutes  35   Oberia Beaudoin A M.D on 01/20/2017 at 7:24 AM [phone] Between 7am to 7pm - Pager - [phone]  After 7pm go to [email or web address] - password [facility]  [facility] -  Office  [phone]

## 2017-01-20 NOTE — Progress Notes (Signed)
Nutrition Follow-up  DOCUMENTATION CODES:   Non-severe (moderate) malnutrition in context of chronic illness, Obesity unspecified  INTERVENTION:  - Continue Premier Protein once/day. - Continue to encourage PO intakes of meals and supplement.  NUTRITION DIAGNOSIS:   Malnutrition (moderate/non-severe) related to chronic illness (COPD and osteoarthritis) as evidenced by mild depletion of body fat, mild depletion of muscle mass, energy intake < 75% for > or equal to 1 month. -ongoing, improving   GOAL:   Patient will meet greater than or equal to 90% of their needs -meeting at this time.   MONITOR:   PO intake, Supplement acceptance, Weight trends, Labs  ASSESSMENT:   81 y.o. male with Past medical history of A. fib, PE, chronic warfarin use, chronic CHF, chronic respiratory failure with hypoxia, COPD. Patient presented with complaints of coffee-ground emesis started overnight on 7/3. Family reports the patient has chronic cough. Recently treated with doxycycline as well as prednisone for UTI and osteoarthritis respectively. Has poor appetite and an unintentional weight loss. Family is unable to quantify the degree of weight loss.  7/18 Per report, pt's appetite has been increasing since last week. Per doc flow sheet, he consumed 100% of all meals yesterday. He has been drinking all Premier Protein shakes provided. MBS done 7/16 showed pharyngeal dysphagia with aspiration risk; pt to remain on Soft diet with thin liquids. Per chart review, weight was trending up from admission until 7/11. Since 7/11 weight is -16.7 kg. Current weight is -14.2 kg from admission.   Medications reviewed; 1 mg oral folic acid/day, 80 mg IV Lasix/day, daily multivitamin with minerals, 40 mg oral Protonix BID, 40 mEq oral KCl x1 dose today, 40 mg Deltasone/day. Labs reviewed; K: 3.1 mmol/L, Cl: 95 mmol/L, BUN: 44 mg/dL, creatinine: 1.5 mg/dL, Ca: 8.3 mg/dL, GFR: 43 mL/min.    7/13 - Admission weight of 114.2  kg used to estimate nutrition needs as weight +11.7 kg since that time.  - Per chart review, he consumed 75% of dinner on 7/8; 50% of lunch and 25% of dinner on 7/11.  - Pt's wife is at bedside and provides nearly all of the information.  - She states for breakfast his AM pt consumed 100% of blueberry muffin and 2 cartons of whole milk with sugar added to them (pt likes his milk this way). He also had a few bites of grits with butter.  - This is the most pt has eaten for a meal since PTA.  - Pt's appetite has been declining over the past 1-1.5 years with notable difference in the past 6 months.  - He has osteoarthritis and chronic dislocation of shoulders which cause pain.  - He was able to feed himself until ~2 weeks ago when back pain became an issue.  - He was not drinking oral nutrition supplements PTA; will trial Premier Protein.  - Physical assessment shows mild edema to all extremities, mild fat, and mild muscle wasting.  - Pt reports he weighed 370 lbs at one time and believes that this was 1-1.5 years ago.  - Wife reports steady weight loss with no drastic losses.  - Per chart review, pt weighed 271 lbs on 4/9 at [facility].  - Based on admission weight, pt has lost 20 lbs (7.4% body weight) in 3 months which is significant for time frame.  - Weight has been trending up since admission which may be attributable to edema to extremities.    Diet Order:  DIET SOFT Room service appropriate? Yes; Fluid consistency:  Thin  Skin:  Reviewed, no issues  Last BM:  7/18  Height:   Ht Readings from Last 1 Encounters:  01/06/17 6' (1.829 m)    Weight:   Wt Readings from Last 1 Encounters:  01/19/17 240 lb 11.9 oz (109.2 kg)    Ideal Body Weight:  80.91 kg  BMI:  Body mass index is 32.65 kg/m.  Estimated Nutritional Needs:   Kcal:  1420-1640 (13-15 kcal/kg)  Protein:  87-110 grams (0.8-1 grams/kg)  Fluid:  >/= 1.7 L/day  EDUCATION NEEDS:   No education needs identified at this  time    Jarome Matin, MS, RD, LDN, CNSC Inpatient Clinical Dietitian Pager # [phone] After hours/weekend pager # [phone]

## 2017-01-21 LAB — BASIC METABOLIC PANEL
Anion gap: 10 (ref 5–15)
BUN: 45 mg/dL — AB (ref 6–20)
CHLORIDE: 96 mmol/L — AB (ref 101–111)
CO2: 31 mmol/L (ref 22–32)
CREATININE: 1.53 mg/dL — AB (ref 0.61–1.24)
Calcium: 8.1 mg/dL — ABNORMAL LOW (ref 8.9–10.3)
GFR calc Af Amer: 48 mL/min — ABNORMAL LOW (ref >60)
GFR calc non Af Amer: 42 mL/min — ABNORMAL LOW (ref >60)
GLUCOSE: 95 mg/dL (ref 65–99)
Potassium: 3.1 mmol/L — ABNORMAL LOW (ref 3.5–5.1)
SODIUM: 137 mmol/L (ref 135–145)

## 2017-01-21 LAB — CBC
HEMATOCRIT: 28.5 % — AB (ref 39.0–52.0)
HEMOGLOBIN: 8.6 g/dL — AB (ref 13.0–17.0)
MCH: 23.8 pg — AB (ref 26.0–34.0)
MCHC: 30.2 g/dL (ref 30.0–36.0)
MCV: 78.9 fL (ref 78.0–100.0)
Platelets: 185 10*3/uL (ref 150–400)
RBC: 3.61 MIL/uL — ABNORMAL LOW (ref 4.22–5.81)
RDW: 19.3 % — ABNORMAL HIGH (ref 11.5–15.5)
WBC: 7.3 10*3/uL (ref 4.0–10.5)

## 2017-01-21 LAB — PROTIME-INR
INR: 1.38
PROTHROMBIN TIME: 17.1 s — AB (ref 11.4–15.2)

## 2017-01-21 MED ORDER — POTASSIUM CHLORIDE CRYS ER 20 MEQ PO TBCR
40.0000 meq | EXTENDED_RELEASE_TABLET | Freq: Once | ORAL | Status: AC
  Administered 2017-01-21: 40 meq via ORAL
  Filled 2017-01-21: qty 2

## 2017-01-21 MED ORDER — FUROSEMIDE 40 MG PO TABS
40.0000 mg | ORAL_TABLET | Freq: Every day | ORAL | Status: DC
  Administered 2017-01-21: 40 mg via ORAL
  Filled 2017-01-21: qty 1

## 2017-01-21 NOTE — Progress Notes (Addendum)
CSW continues to follow to assist with dc planning. [facility] is reviewing clinical's to see if SNF can offer placement. CSW will meet with pt / contact spouse once a decision has been made. Pt has additional bed offers to chose from if necessary. CSW will continue to follow to assist with d/c planning.  Werner Lean LCSW [phone]  [phone]: [facility] is able to accept pt tomorrow, if medically stable, for rehab placement. Pt / spouse are aware and in agreement with plan. CSW will continue to follow to assist with dc planning.  Werner Lean LCSW [phone]

## 2017-01-21 NOTE — Progress Notes (Signed)
OT Cancellation Note  Patient Details Name: Gary Boyer MRN: [id number] DOB: 17-Feb-1936   Cancelled Treatment:    Reason Eval/Treat Not Completed: Other (comment) (pt currently transferring rooms). Will follow up for OT eval as time allows.  Binnie Kand M.S., OTR/L Pager: [phone]  01/21/2017, 1:04 PM

## 2017-01-21 NOTE — Progress Notes (Addendum)
PROGRESS NOTE                                                                                                                                                                                                             Patient Demographics:    Gary Boyer, is a 81 y.o. male, DOB - 1936/04/29, CHE:[id number]  Admit date - 01/06/2017   Admitting Physician Lavina Hamman, MD  Outpatient Primary MD for the patient is [name], MD  LOS - 15  Outpatient Specialists:None  Chief Complaint  Patient presents with  . Emesis       Brief Narrative   81 year old male with history of A. fib, PE on Coumadin, chronic CHF, COPD on home O2 presented to [facility] long EDD with coffee-ground emesis restarted on the night prior to admission. Patient was recently treated with penicillin and doxycycline for UTI. On admission his hemoglobin was 7 with positive FOBT and he was hypotensive. His INR was supratherapeutic. Patient received a total of 4 unit PRBC and 2 units of FFP. He was also in respiratory failure requiring Ventimask and nasal cannula. Patient was scheduled for EGD but chest x-ray done for his dyspnea symptoms showed right-sided opacification which required bronchoscopy on 7/8 with removal of thick mucoid secretion. He then had EGD on 7/9 showing esophagitis in the lower third of esophagus with contact bleeding which stopped spontaneously.. Also had three 4-7 mm bleeding angiodysplastic lesion in the stomach out of which one actively bleeding and was treated followed by hemostatic clips applied.  Patient also was treated for aspiration pneumonia with Unasyn.  Given persistent requirement for high flow nasal cannula and risk for intubation he was monitored under critical care service and transferred back to hospitalist service on 7/14.    Subjective:   He is alert, in no distress, he is breathing better,  Still coughing.  Left  hand with gout , improved.  He was having loose stool from laxative, this has improved    Assessment  & Plan :    Principal Problem: Acute respiratory failure with hypoxia -Likely combination of acute lung injury, COPD exacerbation and aspiration pneumonia on presentation. Chest x-ray on 7/7 showed complete opacification right hemithorax requiring bronchoscopy and removal of mucous plug. Continue chest PT. -Pulmonary consult appreciated. Continue Pulmicort and Brovana twice a day along with  scheduled meds. Added hypertonic saline nebs. Continue pulmonary hygiene. Seen by SLP and dysphagia level III diet. -Chest x-ray shows persistent bilateral pulmonary infiltrates/edema. Marland Kitchenalso noted increased densities in both lung apices. -Patient now on 4 L oxygen.  Change lasix to oral.  Wean oxygen as tolerated.    Acute upper GI bleed With acute blood loss anemia Secondary to bleeding angiodysplastic gastric lesions and distal esophagitis in the setting of supratherapeutic INR. Received 4 units PRBC and 2 units FFP.Marland Kitchen EGD findings as above. Needs twice daily PPI for at least 8-12 weeks and once daily thereafter given large hiatal hernia.   PCCM  discussed with patient and family and recommend resuming anticoagulation after 8 weeks. (Family agree). -hb stable.   Acute gouty arthritis Involving left hand and wrist. Continue with prednisone taper.  Improved. Report improvement of pain.   Pulmonary embolism (HCC) Anticoagulation was on hold at Admit  due to active bleeding. After discussing with patient and family on risk versus benefit, decided on holding anticoagulation for 8 weeks.  Acute on Chronic diastolic CHF Chest x-ray still shows significant sign of CHF, edema.  Change lasix to oral. Replete k.   Chronic kidney disease stage 2-3 Renal function at baseline (1.5-1.8).  Change lasix to oral.      Atrial fibrillation ([place]) Rate controlled. Off anticoagulation.     Malnutrition of  moderate degree Added protein supplement.  Chronic depression Continue Zoloft  Insomnia  continue trazodone at bedtime  Aspiration pneumonia of right lower lobe (HCC) Completed 7 days of Unasyn. Seen by SLP and had MBS on 7/16. Has pharyngeal dysphagia with aspiration risk. On dysphagia level III diet  COPD exacerbation Continue nebs.      Code Status : DO NOT RESUSCITATE  Family Communication  : None at bedside  Disposition Plan  : Needs SNF, discharge in 24 hours.   Barriers For Discharge : Active symptoms  Consults  :   Pulmonary [facility]  Procedures  :  EGD Bronchoscopy  DVT Prophylaxis  :  SCDs  Lab Results  Component Value Date   PLT 185 01/21/2017    Antibiotics  :   Anti-infectives    Start     Dose/Rate Route Frequency Ordered Stop   01/06/17 1200  Ampicillin-Sulbactam (UNASYN) 3 g in sodium chloride 0.9 % 100 mL IVPB     3 g 200 mL/hr over 30 Minutes Intravenous Every 6 hours 01/06/17 1003 01/12/17 2359        Objective:   Vitals:   01/21/17 0300 01/21/17 0400 01/21/17 0500 01/21/17 0808  BP:  (!) 151/90    Pulse: (!) 58 74 (!) 56   Resp: (!) 21 17 (!) 21   Temp:  (!) 97.4 F (36.3 C)    TempSrc:  Oral    SpO2: 94% 92% 90% 98%  Weight:      Height:        Wt Readings from Last 3 Encounters:  01/19/17 109.2 kg (240 lb 11.9 oz)  01/15/15 133 kg (293 lb 3.4 oz)  11/08/12 (!) 139.3 kg (307 lb 1.6 oz)     Intake/Output Summary (Last 24 hours) at 01/21/17 0828 Last data filed at 01/20/17 1200  Gross per 24 hour  Intake              243 ml  Output              400 ml  Net             -  157 ml     Physical Exam Gen.: Alert, in no distress, on 4 L oxygen  HEENT: neck supple, mucose membrane moist  Chest: normal respiratory effort, ronchus left side CVS: S 1, S 2 RRR GI: BS present, soft, nt Musculoskeletal: trace edema     Data Review:    CBC  Recent Labs Lab 01/16/17 1506 01/18/17 0329 01/20/17 0249  01/21/17 0318  WBC 8.6 8.8 9.5 7.3  HGB 9.8* 8.9* 8.9* 8.6*  HCT 32.1* 29.5* 29.0* 28.5*  PLT 187 182 193 185  MCV 79.9 78.9 76.9* 78.9  MCH 24.4* 23.8* 23.6* 23.8*  MCHC 30.5 30.2 30.7 30.2  RDW 19.7* 19.8* 19.1* 19.3*    Chemistries   Recent Labs Lab 01/17/17 0345 01/18/17 0329 01/19/17 0309 01/20/17 0249 01/21/17 0318  NA 139 137 137 138 137  K 3.7 3.4* 3.5 3.1* 3.1*  CL 95* 93* 92* 95* 96*  CO2 36* 36* 35* 32 31  GLUCOSE 115* 112* 135* 124* 95  BUN 28* 30* 37* 44* 45*  CREATININE 1.55* 1.58* 1.51* 1.50* 1.53*  CALCIUM 8.3* 8.3* 8.5* 8.3* 8.1*   ------------------------------------------------------------------------------------------------------------------ No results for input(s): CHOL, HDL, LDLCALC, TRIG, CHOLHDL, LDLDIRECT in the last 72 hours.  No results found for: HGBA1C ------------------------------------------------------------------------------------------------------------------ No results for input(s): TSH, T4TOTAL, T3FREE, THYROIDAB in the last 72 hours.  Invalid input(s): FREET3 ------------------------------------------------------------------------------------------------------------------ No results for input(s): VITAMINB12, FOLATE, FERRITIN, TIBC, IRON, RETICCTPCT in the last 72 hours.  Coagulation profile  Recent Labs Lab 01/17/17 0345 01/18/17 0329 01/19/17 0309 01/20/17 0249 01/21/17 0318  INR 1.54 1.55 1.50 1.33 1.38    No results for input(s): DDIMER in the last 72 hours.  Cardiac Enzymes No results for input(s): CKMB, TROPONINI, MYOGLOBIN in the last 168 hours.  Invalid input(s): CK ------------------------------------------------------------------------------------------------------------------    Component Value Date/Time   BNP 306.7 (H) 01/14/2017 0300    Inpatient Medications  Scheduled Meds: . arformoterol  15 mcg Nebulization BID  . budesonide  0.5 mg Nebulization BID  . fentaNYL (SUBLIMAZE) injection  100 mcg  Intravenous Once  . folic acid  1 mg Oral Daily  . furosemide  80 mg Intravenous Daily  . ipratropium-albuterol  3 mL Nebulization QID  . lidocaine  1 application Other Once  . mouth rinse  15 mL Mouth Rinse BID  . multivitamin with minerals  1 tablet Oral Daily  . nystatin  5 mL Oral QID  . pantoprazole  40 mg Oral BID  . predniSONE  40 mg Oral Q breakfast  . protein supplement shake  11 oz Oral Q24H  . sertraline  100 mg Oral QHS  . sodium chloride flush  3 mL Intravenous Q12H   Continuous Infusions: PRN Meds:.acetaminophen **OR** acetaminophen, ipratropium-albuterol, [DISCONTINUED] ondansetron **OR** ondansetron (ZOFRAN) IV, polyethylene glycol, sodium bicarbonate/sodium chloride, sodium chloride, traZODone  Micro Results No results found for this or any previous visit (from the past 240 hour(s)).  Radiology Reports Ct Chest Wo Contrast  Result Date: 01/06/2017 CLINICAL DATA:  81 y.o. male with Past medical history of A. fib, PE, chronic warfarin use, chronic CHF, chronic respiratory failure with hypoxia, COPD. Patient presents with complaints of coffee-ground emesis started overnight. Has poor appetite and an unintentional weight loss. EXAM: CT CHEST WITHOUT CONTRAST TECHNIQUE: Multidetector CT imaging of the chest was performed following the standard protocol without IV contrast. COMPARISON:  CT of the abdomen and pelvis 01/09/2014 and CT of the chest 10/01/2010 FINDINGS: Cardiovascular: The heart is enlarged. There is extensive coronary artery  calcification. No pericardial effusion. Significant atherosclerosis of the thoracic aorta. Aortic arch is atherosclerotic. Main pulmonary artery is prominent, main pulmonary artery is enlarged, measuring 4.1 cm. Mediastinum/Nodes: The visualized portion of the thyroid gland has a normal appearance. Large hiatal hernia. Lungs/Pleura: Right pleural effusion and right lower lobe consolidation. There is vascular crowding at both lung bases. There are  pleural calcifications. Mild airspace filling is identified within the right upper lobe. Cavitary lesion identified in the left lung apex measuring 1.6 by 4.1 cm. This is smaller compared to 2012 There are paraseptal emphysematous changes in the lung apices bilaterally. Upper Abdomen: There is cirrhotic contour of the liver. Spleen appears enlarged but is only partially imaged. Indeterminate mass is identified in the midpole region of the left kidney, measuring 4.1 cm. There are intrarenal calcifications bilaterally. Adrenal glands are normal in appearance. Musculoskeletal: Degenerative changes are seen in the thoracic spine. IMPRESSION: 1. Cardiomegaly, coronary artery disease and thoracic atherosclerosis. 2. Enlarged pulmonary artery, consistent with pulmonary arterial hypertension. 3. Right pleural effusion. Airspace filling opacities, consistent with infectious process in the right lower lobe and upper lobes. 4. Pleural calcifications, consistent with asbestos exposure. 5. Cavitary lesion in the left upper lobe. 6. Paraseptal emphysema. 7. Large hiatal hernia. 8. Cirrhosis of the liver. 9. Suspect splenomegaly and portal venous hypertension. 10. Left renal mass. Based on previous imaging, this likely represented a cyst but by today's imaging is indeterminate. Consider renal ultrasound for further characterization. 11. Bilateral intrarenal calculi. Aortic Atherosclerosis (ICD10-I70.0) and Emphysema (ICD10-J43.9). Electronically Signed   By: Nolon Nations M.D.   On: 01/06/2017 16:23   Dg Chest Port 1 View  Result Date: 01/19/2017 CLINICAL DATA:  Fever.  Coughing . EXAM: PORTABLE CHEST 1 VIEW COMPARISON:  01/18/2017. FINDINGS: Persistent cardiomegaly. Bilateral pulmonary infiltrates and/or edema again noted. Bilateral pleural effusions again noted . Increase densities noted over both pulmonary apices, possibly overlying vascular structures and or new infiltrates. IMPRESSION: Persistent cardiomegaly.  Persistent bilateral pulmonary infiltrates and/or edema again noted. Bilateral pleural effusions again noted. CHF could present this fashion. Associated pneumonia cannot be excluded. 2. Increased densities noted over both pulmonary apices, possibly overlying vascular structures and/or new infiltrates. Electronically Signed   By: Marcello Moores  Register   On: 01/19/2017 09:15   Dg Chest Port 1 View  Result Date: 01/18/2017 CLINICAL DATA:  Respiratory failure EXAM: PORTABLE CHEST 1 VIEW COMPARISON:  January 15, 2017 FINDINGS: There are persistent pleural effusions bilaterally, larger on the right than on the left. There is patchy airspace consolidation in both lower lung zone regions. There is cardiomegaly with pulmonary vascularity within normal limits. No adenopathy. There is extensive arthropathy in the right shoulder, stable. There is aortic atherosclerosis. IMPRESSION: Persistent cardiomegaly with bilateral pleural effusions. Likely degree of pulmonary vascular congestion. Consolidation in lower lung zone regions is likely due to superimposed pneumonia, although a degree of atelectasis and possibly alveolar edema may be contributory. There is aortic atherosclerosis. Aortic Atherosclerosis (ICD10-I70.0). Electronically Signed   By: Lowella Grip III M.D.   On: 01/18/2017 09:52   Dg Chest Port 1 View  Result Date: 01/15/2017 CLINICAL DATA:  Pneumonia, history of asthma EXAM: PORTABLE CHEST 1 VIEW COMPARISON:  01/14/2017 and prior FINDINGS: Improving aeration bilaterally. Residual bilateral effusions, small on the left and moderate on the right. Bibasilar consolidations. Cardiomegaly with central vascular congestion but decreased compared to prior. IMPRESSION: 1. Improved aeration compared to most recent prior radiograph. 2. Bilateral effusions, small on the left and moderate on the right  with bibasilar consolidations 3. Cardiomegaly with central vascular congestion Electronically Signed   By: Donavan Foil M.D.    On: 01/15/2017 04:02   Dg Chest Port 1 View  Result Date: 01/14/2017 CLINICAL DATA:  Followup pneumonia EXAM: PORTABLE CHEST 1 VIEW COMPARISON:  01/13/2017 FINDINGS: Marked radiographic worsening with subtotal collapse of the right lung due to consolidation, pleural fluid and volume loss. Mediastinal shift towards the right. Patchy infiltrate persists at the left lung base. Left upper lung as well aerated. IMPRESSION: Worsened infiltrate, effusion collapse of the right lung. Electronically Signed   By: Nelson Chimes M.D.   On: 01/14/2017 06:50   Dg Chest Port 1 View  Result Date: 01/13/2017 CLINICAL DATA:  Respiratory failure, hypoxia. EXAM: PORTABLE CHEST 1 VIEW COMPARISON:  Radiograph of January 12, 2017. FINDINGS: Stable cardiomegaly and central pulmonary vascular congestion. No pneumothorax is noted. Degenerative changes are seen involving both shoulders. Stable bilateral perihilar and basilar densities are noted concerning for edema or atelectasis with mild associated pleural effusions. Bony thorax is unremarkable. IMPRESSION: Stable bilateral lung opacities are noted concerning for edema or atelectasis with associated pleural effusions. Electronically Signed   By: Marijo Conception, M.D.   On: 01/13/2017 07:22   Dg Chest Port 1 View  Result Date: 01/12/2017 CLINICAL DATA:  Respiratory failure EXAM: PORTABLE CHEST 1 VIEW COMPARISON:  January 10, 2017 FINDINGS: There is a persistent right pleural effusion with consolidation in the right upper lobe and right base regions. There is mild left base atelectasis. Heart is mildly enlarged with pulmonary vascularity within normal limits. There is aortic atherosclerosis. No adenopathy. There is arthropathy in each shoulder. IMPRESSION: Persistent right pleural effusion with areas of consolidation on the right, unchanged. There is stable left base atelectasis. Overall no new opacity. Stable cardiac prominence. There is aortic atherosclerosis. Aortic Atherosclerosis  (ICD10-I70.0). Electronically Signed   By: Lowella Grip III M.D.   On: 01/12/2017 07:09   Dg Chest Port 1 View  Result Date: 01/10/2017 CLINICAL DATA:  Atelectasis.  Shortness of breath EXAM: PORTABLE CHEST 1 VIEW COMPARISON:  Study obtained earlier in the day and January 09, 2017 FINDINGS: There is stable consolidation in the right upper lobe and right base regions. There is a small right pleural effusion. There is also patchy atelectasis in the left base. No new opacity compared earlier in the day. Heart is mildly enlarged with pulmonary vascularity within normal limits. No evident adenopathy. There is arthropathy in each shoulder. IMPRESSION: Areas of consolidation and volume loss on the right. Small right pleural effusion. Patchy atelectasis left base. No new opacity compared to earlier in the day. Stable cardiac silhouette. Electronically Signed   By: Lowella Grip III M.D.   On: 01/10/2017 17:51   Dg Chest Port 1 View  Result Date: 01/10/2017 CLINICAL DATA:  Atelectasis.  Shortness breath. EXAM: PORTABLE CHEST 1 VIEW COMPARISON:  01/09/2017 FINDINGS: The left costophrenic angle is not included. Technologist note indicates: "ordering MD did not needed the image to be repeated to include costophrenic angles." There is significantly improved aeration in the right hemithorax. Continued volume loss on the right with shift of cardiac and mediastinal structures to the right. This is difficult to quantify given the rightward rotation of the chest during imaging. Continued obscuration of the right hemidiaphragm possibly from layering effusion, atelectasis, or pneumonia. Poor definition of the right mediastinal margin. Vague densities at the left lung base. Indistinct pulmonary vasculature. Potential enlargement of the cardiopericardial silhouette. Considerable degenerative arthropathy of  the right glenohumeral joint. Reduced acromiohumeral distance on the right suspicious for chronic rotator cuff tear.  IMPRESSION: 1. Interval improved aeration in the right hemithorax, but with continued considerable airspace opacity especially at the right lung base and adjacent to the right mediastinum. Cannot exclude layering right pleural effusion. 2. Indistinct airspace opacity in left lower lobe, potentially from edema or pneumonia. 3. Suspected cardiomegaly. Indistinct pulmonary vasculature suggesting pulmonary venous hypertension and potentially mild interstitial edema. 4. Chronic right rotator cuff tear with advanced degenerative glenohumeral arthropathy on the right. Electronically Signed   By: Van Clines M.D.   On: 01/10/2017 17:20   Dg Chest Port 1 View  Result Date: 01/09/2017 CLINICAL DATA:  Worsening shortness of breath. Question aspiration pneumonia. EXAM: PORTABLE CHEST 1 VIEW COMPARISON:  January 06, 2017 FINDINGS: There is complete opacification of the right hemithorax which is worsened in the interval. There is volume loss on the right with shift of the heart mediastinum. Diffuse interstitial opacities in the left lung have worsened. No other interval changes. No pneumothorax. IMPRESSION: 1. Complete opacification of the right hemithorax has worsened in the interval. Recommend clinical correlation and follow-up to resolution. 2. Diffuse interstitial opacities in the left lung are worse in the interval most consistent with edema. Electronically Signed   By: Dorise Bullion III M.D   On: 01/09/2017 09:37   Dg Abd Acute W/chest  Result Date: 01/06/2017 CLINICAL DATA:  Patient with back pain. Coffee-ground emesis. Shortness of breath. EXAM: DG ABDOMEN ACUTE W/ 1V CHEST COMPARISON:  Chest radiograph 08/21/2015. FINDINGS: Patient is mildly rotated to the right. Stable enlarged cardiac and mediastinal contours. Hiatal hernia. There is a large area of consolidation throughout the right mid and lower lung. Left lung is clear. Possible small right pleural effusion. No pneumothorax. Gas is demonstrated within  nondilated loops of large and small bowel in a nonobstructed pattern. No evidence for free intraperitoneal air. Marked degenerative changes of the lumbar spine. IMPRESSION: Nonobstructed bowel gas pattern. Patchy consolidation within the right mid and lower lung which may represent combination of atelectasis and scarring within the right lung base. Superimposed infectious process not excluded. Recommend further evaluation with chest CT to exclude underlying pulmonary mass. Electronically Signed   By: Lovey Newcomer M.D.   On: 01/06/2017 09:09   Dg Swallowing Func-speech Pathology  Result Date: 01/18/2017 Objective Swallowing Evaluation: Type of Study: MBS-Modified Barium Swallow Study Patient Details Name: Ammaar Encina MRN: [id number] Date of Birth: [birth year] Today's Date: 01/18/2017 Time: SLP Start Time (ACUTE ONLY): 1500-SLP Stop Time (ACUTE ONLY): 1530 SLP Time Calculation (min) (ACUTE ONLY): 30 min Past Medical History: Past Medical History: Diagnosis Date . Arthritis   ALL OVER . Asthma  . Atrial fibrillation ([place])   PAST HX OF HEART ABLATION FOR IRREGULAR HEART RATE --SUCCESSFUL-NO FURTHER PROB WITH IRREGULAR HEART BEAT . Bilateral leg edema   WEARS SUPPORT HOSE . Cancer ([place])   SKIN CANCERS . CKD (chronic kidney disease) stage 3, GFR 30-59 ml/min 2014  AKI 01/2015 during admission with obstructing, gangrenouos ventral hernia.   Marland Kitchen COPD (chronic obstructive pulmonary disease) (HCC)   120 pack year hx.  Followed by DR. Winona Legato [place]  . GERD (gastroesophageal reflux disease)  . H/O hiatal hernia  . History of kidney stones   SOME SMALL STONES AT PRESENT TIME- TAKES POTASSIUM CITRATE . Pneumonia   prior hx.  Aspiration PNA 01/2017 required bronch and evacutation secretions.  . Pulmonary embolus ([place]) 2011 OR 2012  CHRONIC WARFARIN SINCE  THE BLOOD CLOT . Shortness of breath   WITH ANY EXERTION-USES OXYGEN AS NEEDED . Sleep apnea   USES BIPAP  SETTING 16 / 11 - PER PULMONOGIST'S OFFICE NOTE Past Surgical  History: Past Surgical History: Procedure Laterality Date . ESOPHAGOGASTRODUODENOSCOPY (EGD) WITH PROPOFOL N/A 01/11/2017  Procedure: ESOPHAGOGASTRODUODENOSCOPY (EGD) WITH PROPOFOL;  Surgeon: Jerene Bears, MD;  Location: [facility];  Service: Endoscopy;  Laterality: N/A; . HEART ABLATION   . MASS REMOVED FROM STOMACH - BENIGN  ABOUT 2008 ? Marland Kitchen MULTIPLE TOOTH EXTRACTIONS   . TONSILLECTOMY    T & A - AS A CHILD . TOTAL KNEE ARTHROPLASTY Left 11/08/2012  LEFT TOTAL KNEE ARTHROPLASTY;  Mauri Pole, MD;  [facility];   . VENTRAL HERNIA REPAIR N/A 01/11/2015  STRANGULATED VENTRAL HERNIA REPAIR, SUBTOTAL OMENTECTOMY, SMALL BOWEL RESECTION X2, PLACEMENT OF NEGATIVE PRESSURE DRESSING; presented with gangrene and obstruction. Fanny Skates, MD; [facility] hospital; HPI: 81 year old male admitted 01/06/17 with coffee-ground emesis/GI bleed. PMH significant for COPD, GERD, aspiration PNA, OSA, arthritis, afib. Orders received for swallow evaluation with concern for silent aspiration, so MBS waas completed to objectively assess swallow function and safety. Subjective: Pt seen in radiology for MBS. Wife present Assessment / Plan / Recommendation CHL IP CLINICAL IMPRESSIONS 01/18/2017 Clinical Impression Pt presents with normal oral, mild pharyngeal dysphagia, characterized primarily by delayed swallow reflex - trigger noted at the pyriform sinus on thin liquids, and at the vallecula on other consistencies. Penetration of thin liquids was noted inconsistently, and primarily with large consecutive boluses. No penetration was noted on small individual sips. The barium tablet was given with water, and was noted to pause in the vallecula, requiring additional bolus of water to clear. Will recommend soft diet and chopped meats, thin liquids (SMALL boluses) and pills one at a time with liquid. Safe swallow precautions posted at [facility] and reviewed with pt/wife. ST will continue to follow to assess diet tolerance and complete education.  SLP Visit  Diagnosis Dysphagia, pharyngeal phase (R13.13) Impact on safety and function Mild aspiration risk   CHL IP TREATMENT RECOMMENDATION 01/18/2017 Treatment Recommendations Therapy as outlined in treatment plan below   Prognosis 01/18/2017 Prognosis for Safe Diet Advancement Good CHL IP DIET RECOMMENDATION 01/18/2017 SLP Diet Recommendations Dysphagia 3 (Mech soft) solids;Thin liquid Liquid Administration via Straw;Cup Medication Administration Whole meds with liquid Compensations Minimize environmental distractions;Slow rate;Small sips/bites Postural Changes Remain semi-upright after after feeds/meals (Comment);Seated upright at 90 degrees   CHL IP OTHER RECOMMENDATIONS 01/18/2017 Oral Care Recommendations Oral care BID   No flowsheet data found.  CHL IP FREQUENCY AND DURATION 01/18/2017 Speech Therapy Frequency (ACUTE ONLY) min 1 x/week Treatment Duration 1 week;2 weeks      CHL IP ORAL PHASE 01/18/2017 Oral Phase WFL  [facility] IP PHARYNGEAL PHASE 01/18/2017 Pharyngeal Phase Impaired Pharyngeal- Thin Straw Delayed swallow initiation-pyriform sinuses;Reduced airway/laryngeal closure;Penetration/Aspiration during swallow Pharyngeal Material does not enter airway;Material enters airway, remains ABOVE vocal cords then ejected out;Material enters airway, remains ABOVE vocal cords and not ejected out Pharyngeal- Puree Delayed swallow initiation-vallecula Pharyngeal- Regular Delayed swallow initiation-vallecula Pharyngeal- Pill Delayed swallow initiation-vallecula  CHL IP CERVICAL ESOPHAGEAL PHASE 01/18/2017 Cervical Esophageal Phase [facility] Celia B. Bueche, MSP, CCC-SLP Shonna Chock 01/18/2017, 3:51 PM               Time Spent in minutes  35   Adreona Brand A M.D on 01/21/2017 at 8:28 AM [phone] Between 7am to 7pm - Pager - [phone]  After 7pm go to  [email or web address] - password [facility]  [facility] -  Office  [phone]

## 2017-01-21 NOTE — Progress Notes (Signed)
Patient has declined use of bipap tonight RT to monitor and assess as needed

## 2017-01-21 NOTE — Progress Notes (Signed)
Date:  January 21, 2017 Chart reviewed for concurrent status and case management needs. Will continue to follow patient progress. Discharge Planning: following for needs Expected discharge date: [id number] Rhonda Davis, BSN, RN3, CCM   [phone] 

## 2017-01-22 LAB — BASIC METABOLIC PANEL
ANION GAP: 7 (ref 5–15)
BUN: 39 mg/dL — ABNORMAL HIGH (ref 6–20)
CHLORIDE: 101 mmol/L (ref 101–111)
CO2: 34 mmol/L — AB (ref 22–32)
Calcium: 8.2 mg/dL — ABNORMAL LOW (ref 8.9–10.3)
Creatinine, Ser: 1.3 mg/dL — ABNORMAL HIGH (ref 0.61–1.24)
GFR calc non Af Amer: 51 mL/min — ABNORMAL LOW (ref >60)
GFR, EST AFRICAN AMERICAN: 59 mL/min — AB (ref >60)
Glucose, Bld: 97 mg/dL (ref 65–99)
Potassium: 3.4 mmol/L — ABNORMAL LOW (ref 3.5–5.1)
Sodium: 142 mmol/L (ref 135–145)

## 2017-01-22 MED ORDER — NYSTATIN 100000 UNIT/ML MT SUSP: 5.0000 mL | Freq: Four times a day (QID) | OROMUCOSAL | 0 refills | Status: AC

## 2017-01-22 MED ORDER — FUROSEMIDE 40 MG PO TABS: 60.0000 mg | ORAL_TABLET | Freq: Every day | ORAL | 0 refills | Status: AC

## 2017-01-22 MED ORDER — POTASSIUM CHLORIDE CRYS ER 20 MEQ PO TBCR
40.0000 meq | EXTENDED_RELEASE_TABLET | Freq: Every day | ORAL | Status: DC
  Administered 2017-01-22: 40 meq via ORAL
  Filled 2017-01-22: qty 2

## 2017-01-22 MED ORDER — POTASSIUM CHLORIDE CRYS ER 20 MEQ PO TBCR: 40.0000 meq | EXTENDED_RELEASE_TABLET | Freq: Every day | ORAL | 0 refills | Status: AC

## 2017-01-22 MED ORDER — FUROSEMIDE 40 MG PO TABS: 40.0000 mg | ORAL_TABLET | Freq: Two times a day (BID) | ORAL | 0 refills | Status: DC

## 2017-01-22 MED ORDER — PANTOPRAZOLE SODIUM 40 MG PO TBEC: 40.0000 mg | DELAYED_RELEASE_TABLET | Freq: Two times a day (BID) | ORAL | 0 refills | Status: AC

## 2017-01-22 MED ORDER — SALINE SPRAY 0.65 % NA SOLN
1.0000 | NASAL | Status: DC | PRN
  Filled 2017-01-22: qty 44

## 2017-01-22 MED ORDER — FUROSEMIDE 40 MG PO TABS
40.0000 mg | ORAL_TABLET | Freq: Two times a day (BID) | ORAL | Status: DC
  Administered 2017-01-22: 40 mg via ORAL
  Filled 2017-01-22: qty 1

## 2017-01-22 MED ORDER — OXYCODONE HCL 5 MG PO TABS: 2.5000 mg | ORAL_TABLET | ORAL | 0 refills | Status: DC | PRN

## 2017-01-22 MED ORDER — PREDNISONE 20 MG PO TABS: 40.0000 mg | ORAL_TABLET | Freq: Every day | ORAL | 0 refills | Status: AC

## 2017-01-22 MED ORDER — ADULT MULTIVITAMIN W/MINERALS CH: 1.0000 | ORAL_TABLET | Freq: Every day | ORAL | 0 refills | Status: AC

## 2017-01-22 NOTE — Discharge Summary (Addendum)
Physician Discharge Summary  Gary Boyer EXB:[id number] DOB: 01-24-1936 DOA: 01/06/2017  PCP: [name], MD  Admit date: 01/06/2017 Discharge date: 01/22/2017  Admitted From: Home  Disposition: SNF  Recommendations for Outpatient Follow-up:  1. Follow up with PCP in 1-2 weeks 2. Please obtain BMP/CBC in one week    Discharge Condition: Stable.  CODE STATUS: DNR Diet recommendation: Dysphagia diet soft.   Brief/Interim Summary: Brief Narrative   81 year old male with history of A. fib, PE on Coumadin, chronic CHF, COPD on home O2 presented to [facility] long EDD with coffee-ground emesis restarted on the night prior to admission. Patient was recently treated with penicillin and doxycycline for UTI. On admission his hemoglobin was 7 with positive FOBT and he was hypotensive. His INR was supratherapeutic. Patient received a total of 4 unit PRBC and 2 units of FFP. He was also in respiratory failure requiring Ventimask and nasal cannula. Patient was scheduled for EGD but chest x-ray done for his dyspnea symptoms showed right-sided opacification which required bronchoscopy on 7/8 with removal of thick mucoid secretion. He then had EGD on 7/9 showing esophagitis in the lower third of esophagus with contact bleeding which stopped spontaneously.. Also had three 4-7 mm bleeding angiodysplastic lesion in the stomach out of which one actively bleeding and was treated followed by hemostatic clips applied.  Patient also was treated for aspiration pneumonia with Unasyn.  Given persistent requirement for high flow nasal cannula and risk for intubation he was monitored under critical care service and transferred back to hospitalist service on 7/  Acute respiratory failure with hypoxia -Likely combination of acute lung injury, COPD exacerbation and aspiration pneumonia on presentation. Chest x-ray on 7/7 showed complete opacification right hemithorax requiring bronchoscopy and removal of mucous plug. He  received  chest PT. He is doing better with flutter valve. No further Chest PT needed.  -Pulmonary consult appreciated. Continue Pulmicort and Brovana twice a day along with scheduled meds. Added hypertonic saline nebs. Continue pulmonary hygiene. Seen by SLP and dysphagia level III diet. -Chest x-ray shows persistent bilateral pulmonary infiltrates/edema. Marland Kitchenalso noted increased densities in both lung apices. -Patient now on 4 L oxygen.  Change lasix to oral. Will increase to 60 mg daily at discharge Patient stable.  Wean oxygen as tolerated.    Acute upper GI bleed With acute blood loss anemia Secondary to bleeding angiodysplastic gastric lesions and distal esophagitis in the setting of supratherapeutic INR. Received 4 units PRBC and 2 units FFP.Marland Kitchen EGD findings as above. Needs twice daily PPI for at least 8-12 weeks and once daily thereafter given large hiatal hernia.   PCCM  discussed with patient and family and recommend resuming anticoagulation after 8 weeks. (Family agree). -hb stable.   Acute gouty arthritis Involving left hand and wrist. Continue with prednisone taper. Last dose 7-21 Improved. Report improvement of pain.   Pulmonary embolism (HCC) Anticoagulation was on hold at Admit  due to active bleeding. After discussing with patient and family on risk versus benefit, decided on holding anticoagulation for 8 weeks.  Acute on Chronic diastolic CHF Chest x-ray still shows significant sign of CHF, edema.  Change lasix to oral. Replete k.   Chronic kidney disease stage 2-3 Renal function at baseline (1.5-1.8).  Change lasix to oral.  Cr stable.     Atrial fibrillation ([place]) Rate controlled. Off anticoagulation.     Malnutrition of moderate degree Added protein supplement.  Chronic depression Continue Zoloft  Insomnia  continue trazodone at bedtime  Aspiration  pneumonia of right lower lobe ([place]) Completed 7 days of Unasyn. Seen by SLP and had MBS on 7/16. Has  pharyngeal dysphagia with aspiration risk. On dysphagia level III diet  COPD exacerbation Continue nebs.   Discharge Diagnoses:  Principal Problem:   Acute upper GI bleed Active Problems:   S/P left TKA   Morbid obesity (HCC)   Atrial fibrillation (HCC)   Pulmonary embolism (HCC)   Incisional hernia with gangrene and obstruction s/p exlap/Sb resection & repair 01/11/2015   Chronic anticoagulation   Benign essential HTN   COPD (chronic obstructive pulmonary disease) (HCC)   Anemia associated with acute blood loss   Supratherapeutic INR   Aspiration pneumonia of right lower lobe (HCC)   CKD (chronic kidney disease) stage 3, GFR 30-59 ml/min   Protein calorie malnutrition (HCC)   Hypotension   Chronic diastolic CHF (congestive heart failure) (HCC)   Atelectasis   Acute esophagitis   Gastric hemorrhage due to angiodysplasia of stomach   Malnutrition of moderate degree    Discharge Instructions  Discharge Instructions    Diet - low sodium heart healthy    Complete by:  As directed    Increase activity slowly    Complete by:  As directed      Allergies as of 01/22/2017      Reactions   Ciprofloxacin Diarrhea   Morphine And Related Other (See Comments)   Reaction:  Hallucinations    Zithromax [azithromycin] Diarrhea      Medication List    STOP taking these medications   amLODipine 10 MG tablet Commonly known as:  NORVASC   enalapril 10 MG tablet Commonly known as:  VASOTEC   oxyCODONE 5 MG immediate release tablet Commonly known as:  Oxy IR/ROXICODONE   ranitidine 150 MG tablet Commonly known as:  ZANTAC   warfarin 1 MG tablet Commonly known as:  COUMADIN   warfarin 4 MG tablet Commonly known as:  COUMADIN     TAKE these medications   albuterol (2.5 MG/3ML) 0.083% nebulizer solution Commonly known as:  PROVENTIL Take 2.5 mg by nebulization every 6 (six) hours as needed for wheezing or shortness of breath.   arformoterol 15 MCG/2ML Nebu Commonly  known as:  BROVANA Take 15 mcg by nebulization 2 (two) times daily.   budesonide 0.5 MG/2ML nebulizer solution Commonly known as:  PULMICORT Take 0.5 mg by nebulization 2 (two) times daily.   cholecalciferol 1000 units tablet Commonly known as:  VITAMIN D Take 1,000 Units by mouth daily.   furosemide 40 MG tablet Commonly known as:  LASIX Take 1.5 tablets (60 mg total) by mouth daily. What changed:  how much to take   ipratropium 0.02 % nebulizer solution Commonly known as:  ATROVENT Take 0.5 mg by nebulization 4 (four) times daily.   multivitamin with minerals Tabs tablet Take 1 tablet by mouth daily.   nystatin 100000 UNIT/ML suspension Commonly known as:  MYCOSTATIN Take 5 mLs (500,000 Units total) by mouth 4 (four) times daily.   pantoprazole 40 MG tablet Commonly known as:  PROTONIX Take 1 tablet (40 mg total) by mouth 2 (two) times daily.   potassium chloride SA 20 MEQ tablet Commonly known as:  K-DUR,KLOR-CON Take 2 tablets (40 mEq total) by mouth daily.   predniSONE 20 MG tablet Commonly known as:  DELTASONE Take 2 tablets (40 mg total) by mouth daily with breakfast.   sertraline 50 MG tablet Commonly known as:  ZOLOFT Take 100 mg by mouth at bedtime.  vitamin B-12 1000 MCG tablet Commonly known as:  CYANOCOBALAMIN Take 1,000 mcg by mouth daily.      Contact information for after-discharge care    [facility] .   Specialty:  [place] information: [address] [place] [phone]             Allergies  Allergen Reactions  . Ciprofloxacin Diarrhea  . Morphine And Related Other (See Comments)    Reaction:  Hallucinations   . Zithromax [Azithromycin] Diarrhea    Consultations:  CCM   Procedures/Studies: Ct Chest Wo Contrast  Result Date: 01/06/2017 CLINICAL DATA:  81 y.o. male with Past medical history of A. fib, PE, chronic warfarin use,  chronic CHF, chronic respiratory failure with hypoxia, COPD. Patient presents with complaints of coffee-ground emesis started overnight. Has poor appetite and an unintentional weight loss. EXAM: CT CHEST WITHOUT CONTRAST TECHNIQUE: Multidetector CT imaging of the chest was performed following the standard protocol without IV contrast. COMPARISON:  CT of the abdomen and pelvis 01/09/2014 and CT of the chest 10/01/2010 FINDINGS: Cardiovascular: The heart is enlarged. There is extensive coronary artery calcification. No pericardial effusion. Significant atherosclerosis of the thoracic aorta. Aortic arch is atherosclerotic. Main pulmonary artery is prominent, main pulmonary artery is enlarged, measuring 4.1 cm. Mediastinum/Nodes: The visualized portion of the thyroid gland has a normal appearance. Large hiatal hernia. Lungs/Pleura: Right pleural effusion and right lower lobe consolidation. There is vascular crowding at both lung bases. There are pleural calcifications. Mild airspace filling is identified within the right upper lobe. Cavitary lesion identified in the left lung apex measuring 1.6 by 4.1 cm. This is smaller compared to 2012 There are paraseptal emphysematous changes in the lung apices bilaterally. Upper Abdomen: There is cirrhotic contour of the liver. Spleen appears enlarged but is only partially imaged. Indeterminate mass is identified in the midpole region of the left kidney, measuring 4.1 cm. There are intrarenal calcifications bilaterally. Adrenal glands are normal in appearance. Musculoskeletal: Degenerative changes are seen in the thoracic spine. IMPRESSION: 1. Cardiomegaly, coronary artery disease and thoracic atherosclerosis. 2. Enlarged pulmonary artery, consistent with pulmonary arterial hypertension. 3. Right pleural effusion. Airspace filling opacities, consistent with infectious process in the right lower lobe and upper lobes. 4. Pleural calcifications, consistent with asbestos exposure. 5.  Cavitary lesion in the left upper lobe. 6. Paraseptal emphysema. 7. Large hiatal hernia. 8. Cirrhosis of the liver. 9. Suspect splenomegaly and portal venous hypertension. 10. Left renal mass. Based on previous imaging, this likely represented a cyst but by today's imaging is indeterminate. Consider renal ultrasound for further characterization. 11. Bilateral intrarenal calculi. Aortic Atherosclerosis (ICD10-I70.0) and Emphysema (ICD10-J43.9). Electronically Signed   By: Nolon Nations M.D.   On: 01/06/2017 16:23   Dg Chest Port 1 View  Result Date: 01/19/2017 CLINICAL DATA:  Fever.  Coughing . EXAM: PORTABLE CHEST 1 VIEW COMPARISON:  01/18/2017. FINDINGS: Persistent cardiomegaly. Bilateral pulmonary infiltrates and/or edema again noted. Bilateral pleural effusions again noted . Increase densities noted over both pulmonary apices, possibly overlying vascular structures and or new infiltrates. IMPRESSION: Persistent cardiomegaly. Persistent bilateral pulmonary infiltrates and/or edema again noted. Bilateral pleural effusions again noted. CHF could present this fashion. Associated pneumonia cannot be excluded. 2. Increased densities noted over both pulmonary apices, possibly overlying vascular structures and/or new infiltrates. Electronically Signed   By: Marcello Moores  Register   On: 01/19/2017 09:15   Dg Chest Port 1 View  Result Date: 01/18/2017 CLINICAL  DATA:  Respiratory failure EXAM: PORTABLE CHEST 1 VIEW COMPARISON:  January 15, 2017 FINDINGS: There are persistent pleural effusions bilaterally, larger on the right than on the left. There is patchy airspace consolidation in both lower lung zone regions. There is cardiomegaly with pulmonary vascularity within normal limits. No adenopathy. There is extensive arthropathy in the right shoulder, stable. There is aortic atherosclerosis. IMPRESSION: Persistent cardiomegaly with bilateral pleural effusions. Likely degree of pulmonary vascular congestion. Consolidation  in lower lung zone regions is likely due to superimposed pneumonia, although a degree of atelectasis and possibly alveolar edema may be contributory. There is aortic atherosclerosis. Aortic Atherosclerosis (ICD10-I70.0). Electronically Signed   By: Lowella Grip III M.D.   On: 01/18/2017 09:52   Dg Chest Port 1 View  Result Date: 01/15/2017 CLINICAL DATA:  Pneumonia, history of asthma EXAM: PORTABLE CHEST 1 VIEW COMPARISON:  01/14/2017 and prior FINDINGS: Improving aeration bilaterally. Residual bilateral effusions, small on the left and moderate on the right. Bibasilar consolidations. Cardiomegaly with central vascular congestion but decreased compared to prior. IMPRESSION: 1. Improved aeration compared to most recent prior radiograph. 2. Bilateral effusions, small on the left and moderate on the right with bibasilar consolidations 3. Cardiomegaly with central vascular congestion Electronically Signed   By: Donavan Foil M.D.   On: 01/15/2017 04:02   Dg Chest Port 1 View  Result Date: 01/14/2017 CLINICAL DATA:  Followup pneumonia EXAM: PORTABLE CHEST 1 VIEW COMPARISON:  01/13/2017 FINDINGS: Marked radiographic worsening with subtotal collapse of the right lung due to consolidation, pleural fluid and volume loss. Mediastinal shift towards the right. Patchy infiltrate persists at the left lung base. Left upper lung as well aerated. IMPRESSION: Worsened infiltrate, effusion collapse of the right lung. Electronically Signed   By: Nelson Chimes M.D.   On: 01/14/2017 06:50   Dg Chest Port 1 View  Result Date: 01/13/2017 CLINICAL DATA:  Respiratory failure, hypoxia. EXAM: PORTABLE CHEST 1 VIEW COMPARISON:  Radiograph of January 12, 2017. FINDINGS: Stable cardiomegaly and central pulmonary vascular congestion. No pneumothorax is noted. Degenerative changes are seen involving both shoulders. Stable bilateral perihilar and basilar densities are noted concerning for edema or atelectasis with mild associated  pleural effusions. Bony thorax is unremarkable. IMPRESSION: Stable bilateral lung opacities are noted concerning for edema or atelectasis with associated pleural effusions. Electronically Signed   By: Marijo Conception, M.D.   On: 01/13/2017 07:22   Dg Chest Port 1 View  Result Date: 01/12/2017 CLINICAL DATA:  Respiratory failure EXAM: PORTABLE CHEST 1 VIEW COMPARISON:  January 10, 2017 FINDINGS: There is a persistent right pleural effusion with consolidation in the right upper lobe and right base regions. There is mild left base atelectasis. Heart is mildly enlarged with pulmonary vascularity within normal limits. There is aortic atherosclerosis. No adenopathy. There is arthropathy in each shoulder. IMPRESSION: Persistent right pleural effusion with areas of consolidation on the right, unchanged. There is stable left base atelectasis. Overall no new opacity. Stable cardiac prominence. There is aortic atherosclerosis. Aortic Atherosclerosis (ICD10-I70.0). Electronically Signed   By: Lowella Grip III M.D.   On: 01/12/2017 07:09   Dg Chest Port 1 View  Result Date: 01/10/2017 CLINICAL DATA:  Atelectasis.  Shortness of breath EXAM: PORTABLE CHEST 1 VIEW COMPARISON:  Study obtained earlier in the day and January 09, 2017 FINDINGS: There is stable consolidation in the right upper lobe and right base regions. There is a small right pleural effusion. There is also patchy atelectasis in the left base. No  new opacity compared earlier in the day. Heart is mildly enlarged with pulmonary vascularity within normal limits. No evident adenopathy. There is arthropathy in each shoulder. IMPRESSION: Areas of consolidation and volume loss on the right. Small right pleural effusion. Patchy atelectasis left base. No new opacity compared to earlier in the day. Stable cardiac silhouette. Electronically Signed   By: Lowella Grip III M.D.   On: 01/10/2017 17:51   Dg Chest Port 1 View  Result Date: 01/10/2017 CLINICAL DATA:   Atelectasis.  Shortness breath. EXAM: PORTABLE CHEST 1 VIEW COMPARISON:  01/09/2017 FINDINGS: The left costophrenic angle is not included. Technologist note indicates: "ordering MD did not needed the image to be repeated to include costophrenic angles." There is significantly improved aeration in the right hemithorax. Continued volume loss on the right with shift of cardiac and mediastinal structures to the right. This is difficult to quantify given the rightward rotation of the chest during imaging. Continued obscuration of the right hemidiaphragm possibly from layering effusion, atelectasis, or pneumonia. Poor definition of the right mediastinal margin. Vague densities at the left lung base. Indistinct pulmonary vasculature. Potential enlargement of the cardiopericardial silhouette. Considerable degenerative arthropathy of the right glenohumeral joint. Reduced acromiohumeral distance on the right suspicious for chronic rotator cuff tear. IMPRESSION: 1. Interval improved aeration in the right hemithorax, but with continued considerable airspace opacity especially at the right lung base and adjacent to the right mediastinum. Cannot exclude layering right pleural effusion. 2. Indistinct airspace opacity in left lower lobe, potentially from edema or pneumonia. 3. Suspected cardiomegaly. Indistinct pulmonary vasculature suggesting pulmonary venous hypertension and potentially mild interstitial edema. 4. Chronic right rotator cuff tear with advanced degenerative glenohumeral arthropathy on the right. Electronically Signed   By: Van Clines M.D.   On: 01/10/2017 17:20   Dg Chest Port 1 View  Result Date: 01/09/2017 CLINICAL DATA:  Worsening shortness of breath. Question aspiration pneumonia. EXAM: PORTABLE CHEST 1 VIEW COMPARISON:  January 06, 2017 FINDINGS: There is complete opacification of the right hemithorax which is worsened in the interval. There is volume loss on the right with shift of the heart  mediastinum. Diffuse interstitial opacities in the left lung have worsened. No other interval changes. No pneumothorax. IMPRESSION: 1. Complete opacification of the right hemithorax has worsened in the interval. Recommend clinical correlation and follow-up to resolution. 2. Diffuse interstitial opacities in the left lung are worse in the interval most consistent with edema. Electronically Signed   By: Dorise Bullion III M.D   On: 01/09/2017 09:37   Dg Abd Acute W/chest  Result Date: 01/06/2017 CLINICAL DATA:  Patient with back pain. Coffee-ground emesis. Shortness of breath. EXAM: DG ABDOMEN ACUTE W/ 1V CHEST COMPARISON:  Chest radiograph 08/21/2015. FINDINGS: Patient is mildly rotated to the right. Stable enlarged cardiac and mediastinal contours. Hiatal hernia. There is a large area of consolidation throughout the right mid and lower lung. Left lung is clear. Possible small right pleural effusion. No pneumothorax. Gas is demonstrated within nondilated loops of large and small bowel in a nonobstructed pattern. No evidence for free intraperitoneal air. Marked degenerative changes of the lumbar spine. IMPRESSION: Nonobstructed bowel gas pattern. Patchy consolidation within the right mid and lower lung which may represent combination of atelectasis and scarring within the right lung base. Superimposed infectious process not excluded. Recommend further evaluation with chest CT to exclude underlying pulmonary mass. Electronically Signed   By: Lovey Newcomer M.D.   On: 01/06/2017 09:09   Dg Swallowing Func-speech Pathology  Result Date: 01/18/2017 Objective Swallowing Evaluation: Type of Study: MBS-Modified Barium Swallow Study Patient Details Name: Ioannis Schuh MRN: [id number] Date of Birth: [birth year] Today's Date: 01/18/2017 Time: SLP Start Time (ACUTE ONLY): 1500-SLP Stop Time (ACUTE ONLY): 1530 SLP Time Calculation (min) (ACUTE ONLY): 30 min Past Medical History: Past Medical History: Diagnosis Date . Arthritis    ALL OVER . Asthma  . Atrial fibrillation ([place])   PAST HX OF HEART ABLATION FOR IRREGULAR HEART RATE --SUCCESSFUL-NO FURTHER PROB WITH IRREGULAR HEART BEAT . Bilateral leg edema   WEARS SUPPORT HOSE . Cancer ([place])   SKIN CANCERS . CKD (chronic kidney disease) stage 3, GFR 30-59 ml/min 2014  AKI 01/2015 during admission with obstructing, gangrenouos ventral hernia.   Marland Kitchen COPD (chronic obstructive pulmonary disease) (HCC)   120 pack year hx.  Followed by DR. Winona Legato IN [place]  . GERD (gastroesophageal reflux disease)  . H/O hiatal hernia  . History of kidney stones   SOME SMALL STONES AT PRESENT TIME- TAKES POTASSIUM CITRATE . Pneumonia   prior hx.  Aspiration PNA 01/2017 required bronch and evacutation secretions.  . Pulmonary embolus ([place]) 2011 OR 2012  CHRONIC WARFARIN SINCE THE BLOOD CLOT . Shortness of breath   WITH ANY EXERTION-USES OXYGEN AS NEEDED . Sleep apnea   USES BIPAP  SETTING 16 / 11 - PER PULMONOGIST'S OFFICE NOTE Past Surgical History: Past Surgical History: Procedure Laterality Date . ESOPHAGOGASTRODUODENOSCOPY (EGD) WITH PROPOFOL N/A 01/11/2017  Procedure: ESOPHAGOGASTRODUODENOSCOPY (EGD) WITH PROPOFOL;  Surgeon: Jerene Bears, MD;  Location: [facility] ENDOSCOPY;  Service: Endoscopy;  Laterality: N/A; . HEART ABLATION   . MASS REMOVED FROM STOMACH - BENIGN  ABOUT 2008 ? Marland Kitchen MULTIPLE TOOTH EXTRACTIONS   . TONSILLECTOMY    T & A - AS A CHILD . TOTAL KNEE ARTHROPLASTY Left 11/08/2012  LEFT TOTAL KNEE ARTHROPLASTY;  Mauri Pole, MD;  [facility] hospital;   . VENTRAL HERNIA REPAIR N/A 01/11/2015  STRANGULATED VENTRAL HERNIA REPAIR, SUBTOTAL OMENTECTOMY, SMALL BOWEL RESECTION X2, PLACEMENT OF NEGATIVE PRESSURE DRESSING; presented with gangrene and obstruction. Fanny Skates, MD; [facility] hospital; HPI: 81 year old male admitted 01/06/17 with coffee-ground emesis/GI bleed. PMH significant for COPD, GERD, aspiration PNA, OSA, arthritis, afib. Orders received for swallow evaluation with concern for silent aspiration, so MBS waas  completed to objectively assess swallow function and safety. Subjective: Pt seen in radiology for MBS. Wife present Assessment / Plan / Recommendation CHL IP CLINICAL IMPRESSIONS 01/18/2017 Clinical Impression Pt presents with normal oral, mild pharyngeal dysphagia, characterized primarily by delayed swallow reflex - trigger noted at the pyriform sinus on thin liquids, and at the vallecula on other consistencies. Penetration of thin liquids was noted inconsistently, and primarily with large consecutive boluses. No penetration was noted on small individual sips. The barium tablet was given with water, and was noted to pause in the vallecula, requiring additional bolus of water to clear. Will recommend soft diet and chopped meats, thin liquids (SMALL boluses) and pills one at a time with liquid. Safe swallow precautions posted at [facility] and reviewed with pt/wife. ST will continue to follow to assess diet tolerance and complete education.  SLP Visit Diagnosis Dysphagia, pharyngeal phase (R13.13) Impact on safety and function Mild aspiration risk   CHL IP TREATMENT RECOMMENDATION 01/18/2017 Treatment Recommendations Therapy as outlined in treatment plan below   Prognosis 01/18/2017 Prognosis for Safe Diet Advancement Good CHL IP DIET RECOMMENDATION 01/18/2017 SLP Diet Recommendations Dysphagia 3 (Mech soft) solids;Thin liquid Liquid Administration via Straw;Cup Medication Administration Whole  meds with liquid Compensations Minimize environmental distractions;Slow rate;Small sips/bites Postural Changes Remain semi-upright after after feeds/meals (Comment);Seated upright at 90 degrees   CHL IP OTHER RECOMMENDATIONS 01/18/2017 Oral Care Recommendations Oral care BID   No flowsheet data found.  CHL IP FREQUENCY AND DURATION 01/18/2017 Speech Therapy Frequency (ACUTE ONLY) min 1 x/week Treatment Duration 1 week;2 weeks      CHL IP ORAL PHASE 01/18/2017 Oral Phase WFL  CHL IP PHARYNGEAL PHASE 01/18/2017 Pharyngeal Phase Impaired  Pharyngeal- Thin Straw Delayed swallow initiation-pyriform sinuses;Reduced airway/laryngeal closure;Penetration/Aspiration during swallow Pharyngeal Material does not enter airway;Material enters airway, remains ABOVE vocal cords then ejected out;Material enters airway, remains ABOVE vocal cords and not ejected out Pharyngeal- Puree Delayed swallow initiation-vallecula Pharyngeal- Regular Delayed swallow initiation-vallecula Pharyngeal- Pill Delayed swallow initiation-vallecula  CHL IP CERVICAL ESOPHAGEAL PHASE 01/18/2017 Cervical Esophageal Phase [facility] Celia B. Callao, MSP, CCC-SLP Shonna Chock 01/18/2017, 3:51 PM                 Subjective: He is feeling better. He has loose stool after laxative.  He was constipated for I wee.  He is breathing better , nose stuff.   Discharge Exam: Vitals:   01/21/17 2151 01/22/17 0448  BP: 123/83 131/84  Pulse: 70 73  Resp: 18 (!) 21  Temp: 98.1 F (36.7 C) 97.9 F (36.6 C)   Vitals:   01/21/17 2151 01/22/17 0448 01/22/17 0500 01/22/17 0730  BP: 123/83 131/84    Pulse: 70 73    Resp: 18 (!) 21    Temp: 98.1 F (36.7 C) 97.9 F (36.6 C)    TempSrc: Oral Oral    SpO2: 97% 94%  95%  Weight: 108.4 kg (238 lb 14.4 oz)  108.4 kg (238 lb 14.4 oz)   Height:        General: Pt is alert, awake, not in acute distress, 4 L  Cardiovascular: RRR, S1/S2 +, no rubs, no gallops Respiratory: Bilateral air movement, no wheezing, positive  rhonchi Abdominal: Soft, NT, ND, bowel sounds + Extremities: no edema, no cyanosis    The results of significant diagnostics from this hospitalization (including imaging, microbiology, ancillary and laboratory) are listed below for reference.     Microbiology: No results found for this or any previous visit (from the past 240 hour(s)).   Labs: BNP (last 3 results)  Recent Labs  01/13/17 1020 01/14/17 0300  BNP 463.8* 182.9*   Basic Metabolic Panel:  Recent Labs Lab 01/18/17 0329 01/19/17 0309  01/20/17 0249 01/21/17 0318 01/22/17 0325  NA 137 137 138 137 142  K 3.4* 3.5 3.1* 3.1* 3.4*  CL 93* 92* 95* 96* 101  CO2 36* 35* 32 31 34*  GLUCOSE 112* 135* 124* 95 97  BUN 30* 37* 44* 45* 39*  CREATININE 1.58* 1.51* 1.50* 1.53* 1.30*  CALCIUM 8.3* 8.5* 8.3* 8.1* 8.2*   Liver Function Tests: No results for input(s): AST, ALT, ALKPHOS, BILITOT, PROT, ALBUMIN in the last 168 hours. No results for input(s): LIPASE, AMYLASE in the last 168 hours. No results for input(s): AMMONIA in the last 168 hours. CBC:  Recent Labs Lab 01/16/17 1506 01/18/17 0329 01/20/17 0249 01/21/17 0318  WBC 8.6 8.8 9.5 7.3  HGB 9.8* 8.9* 8.9* 8.6*  HCT 32.1* 29.5* 29.0* 28.5*  MCV 79.9 78.9 76.9* 78.9  PLT 187 182 193 185   Cardiac Enzymes: No results for input(s): CKTOTAL, CKMB, CKMBINDEX, TROPONINI in the last 168 hours. BNP: Invalid input(s): POCBNP CBG: No results for input(s):  GLUCAP in the last 168 hours. D-Dimer No results for input(s): DDIMER in the last 72 hours. Hgb A1c No results for input(s): HGBA1C in the last 72 hours. Lipid Profile No results for input(s): CHOL, HDL, LDLCALC, TRIG, CHOLHDL, LDLDIRECT in the last 72 hours. Thyroid function studies No results for input(s): TSH, T4TOTAL, T3FREE, THYROIDAB in the last 72 hours.  Invalid input(s): FREET3 Anemia work up No results for input(s): VITAMINB12, FOLATE, FERRITIN, TIBC, IRON, RETICCTPCT in the last 72 hours. Urinalysis    Component Value Date/Time   COLORURINE AMBER (A) 01/11/2015 0659   APPEARANCEUR CLOUDY (A) 01/11/2015 0659   LABSPEC 1.023 01/11/2015 0659   PHURINE 5.0 01/11/2015 0659   GLUCOSEU NEGATIVE 01/11/2015 0659   HGBUR MODERATE (A) 01/11/2015 0659   BILIRUBINUR SMALL (A) 01/11/2015 0659   KETONESUR NEGATIVE 01/11/2015 0659   PROTEINUR 30 (A) 01/11/2015 0659   UROBILINOGEN 0.2 01/11/2015 0659   NITRITE NEGATIVE 01/11/2015 0659   LEUKOCYTESUR MODERATE (A) 01/11/2015 0659   Sepsis Labs Invalid  input(s): PROCALCITONIN,  WBC,  LACTICIDVEN Microbiology No results found for this or any previous visit (from the past 240 hour(s)).   Time coordinating discharge: Over 30 minutes  SIGNED:   Elmarie Shiley, MD  [facility] 01/22/2017, 9:00 AM Pager [phone]  If 7PM-7AM, please contact night-coverage [email or web address] Password [email or web address]

## 2017-01-22 NOTE — Progress Notes (Signed)
OT Cancellation Note  Patient Details Name: Goro Wenrick MRN: [id number] DOB: [birth year]   Cancelled Treatment:    Reason Eval/Treat Not Completed: Other (comment) (pt planning d/c to SNF; defer OT to next venue). Pleas re-consult if needs change. Thank you for this referral.  Binnie Kand M.S., OTR/L Pager: [phone]  01/22/2017, 10:16 AM

## 2017-01-22 NOTE — Clinical Social Work Placement (Signed)
   CLINICAL SOCIAL WORK PLACEMENT  NOTE  Date:  01/22/2017  Patient Details  Name: Gary Boyer MRN: [id number] Date of Birth: 08/01/1935  Clinical Social Work is seeking post-discharge placement for this patient at the [facility] [place] level of care (*CSW will initial, date and re-position this form in  chart as items are completed):  Yes   Patient/family provided with [place] Work Department's list of facilities offering this level of care within the geographic area requested by the patient (or if unable, by the patient's family).  Yes   Patient/family informed of their freedom to choose among providers that offer the needed level of care, that participate in Medicare, Medicaid or managed care program needed by the patient, have an available bed and are willing to accept the patient.  Yes   Patient/family informed of [facility]'s ownership interest in [facility] and [facility], as well as of the fact that they are under no obligation to receive care at these facilities.  PASRR submitted to EDS on       PASRR number received on       Existing PASRR number confirmed on 01/12/17     FL2 transmitted to all facilities in geographic area requested by pt/family on 01/12/17     FL2 transmitted to all facilities within larger geographic area on       Patient informed that his/her managed care company has contracts with or will negotiate with certain facilities, including the following:        Yes   Patient/family informed of bed offers received.  Patient chooses bed at [facility]     Physician recommends and patient chooses bed at      Patient to be transferred to [facility] on 01/22/17.  Patient to be transferred to facility by [facility]     Patient family notified on 01/22/17 of transfer.  Name of family member notified:  Spouse     PHYSICIAN       Additional Comment: Pt / spouse are in agreement with dc/  to [facility] today. [facility] transport is required. Medical necessity form completed. DC Summary sent to SNF for review. Scripts included in [employer]. # for report provided to nsg.   _______________________________________________ Luretha Rued, [place]  [phone] 01/22/2017, 1:05 PM

## 2017-01-22 NOTE — Progress Notes (Signed)
Report called to Judeen Hammans, Therapist, sports at [facility] in [place]. Pt discharged in stable condition. No immediate questions or concerns.

## 2017-08-06 DEATH — deceased

## 2017-09-16 IMAGING — RF DG SWALLOWING FUNCTION - NRPT [facility]
10 series · 20 of 24 positions shown · non-contrast
Comparison: none

[Series 1: cp_standard · 0.34mm/px · 2 of 55 frames shown (1 of 10)]
[frame 9/55]
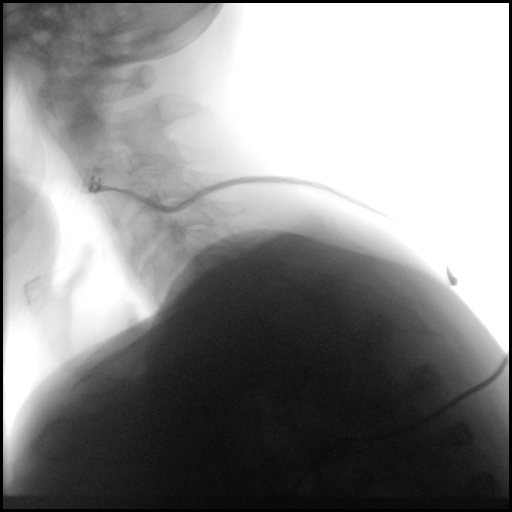
[frame 47/55]
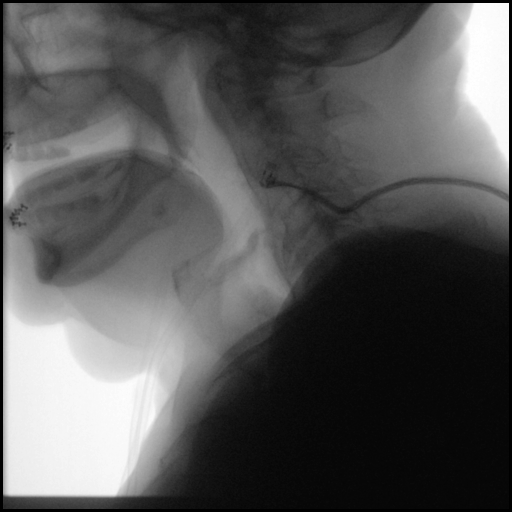

[Series 2: cp_standard · 0.34mm/px · 2 of 76 frames shown (2 of 10)]
[frame 32/76]
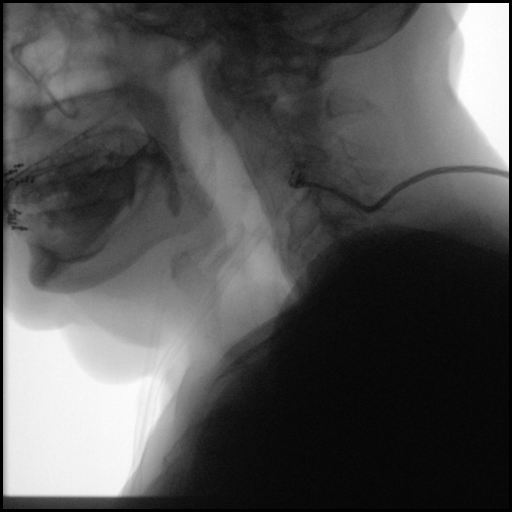
[frame 65/76]
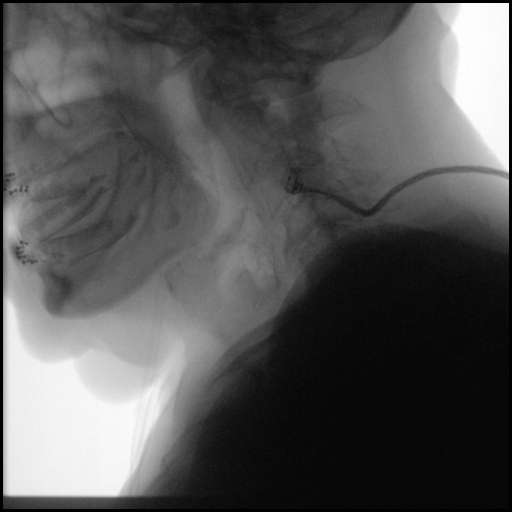

[Series 3: cp_standard · 0.34mm/px · 2 of 93 frames shown (3 of 10)]
[frame 2/93]
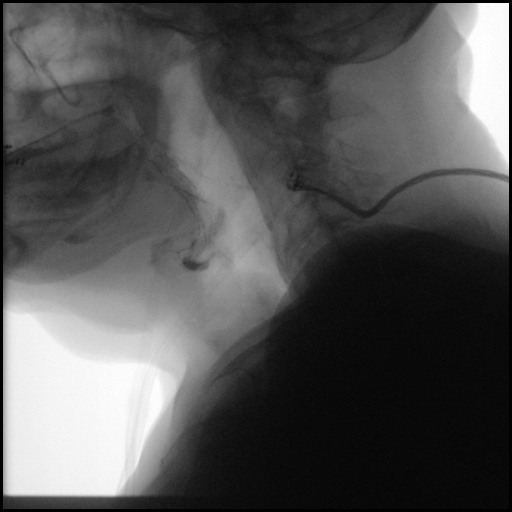
[frame 47/93]
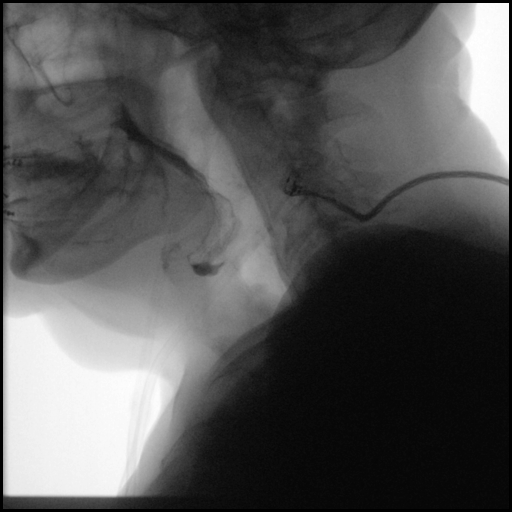

[Series 4: cp_standard · 0.34mm/px · 2 of 76 frames shown (4 of 10)]
[frame 12/76]
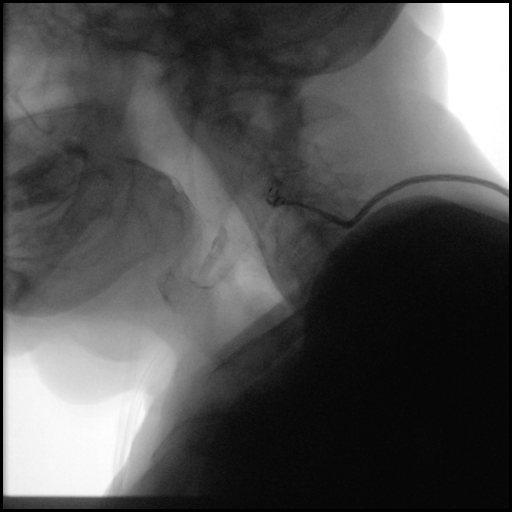
[frame 76/76]
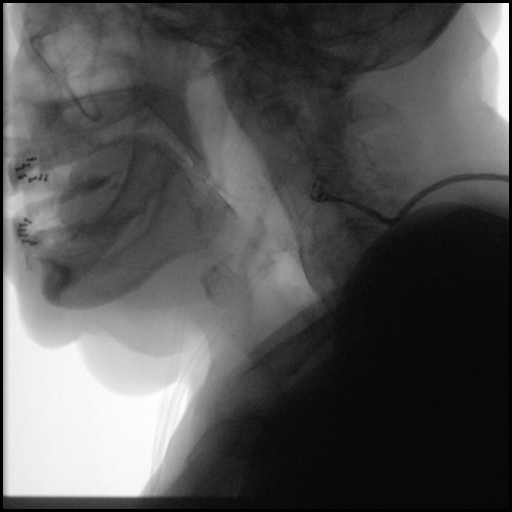

[Series 5: cp_standard · 0.34mm/px · 2 of 213 frames shown (5 of 10)]
[frame 39/213]
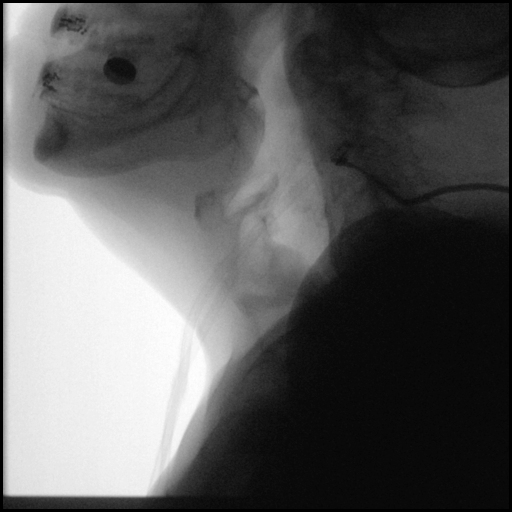
[frame 182/213]
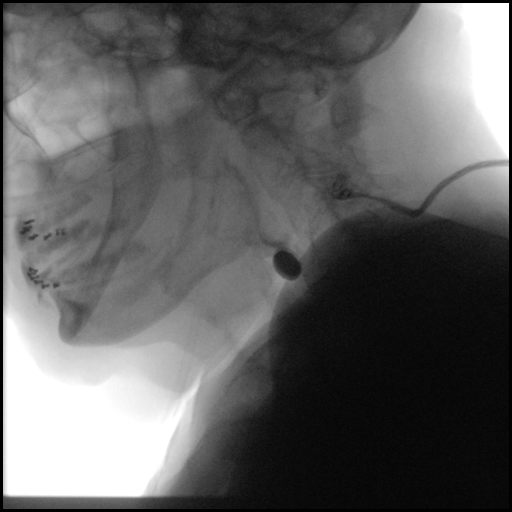

[Series 6: cp_standard · 0.34mm/px · 2 of 99 frames shown (6 of 10)]
[frame 9/99]
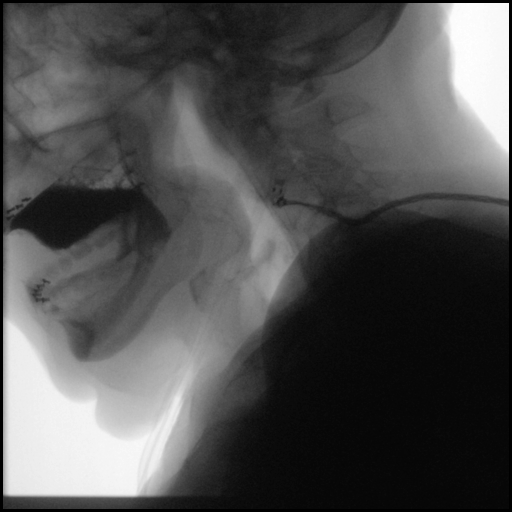
[frame 50/99]
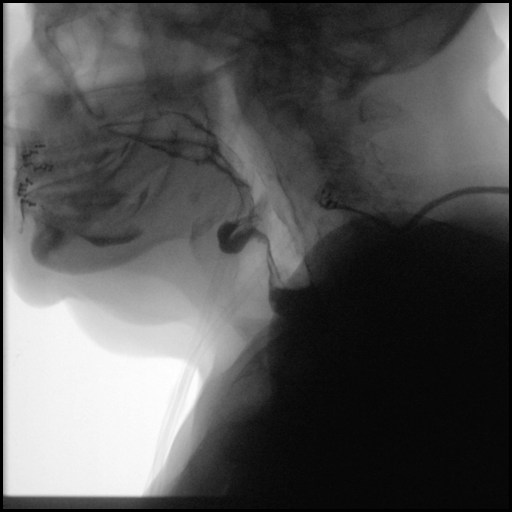

[Series 7: cp_standard · 0.34mm/px · 2 of 249 frames shown (7 of 10)]
[frame 125/249]
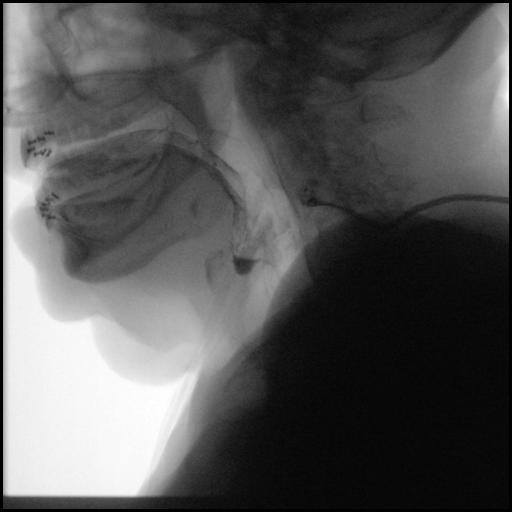
[frame 212/249]
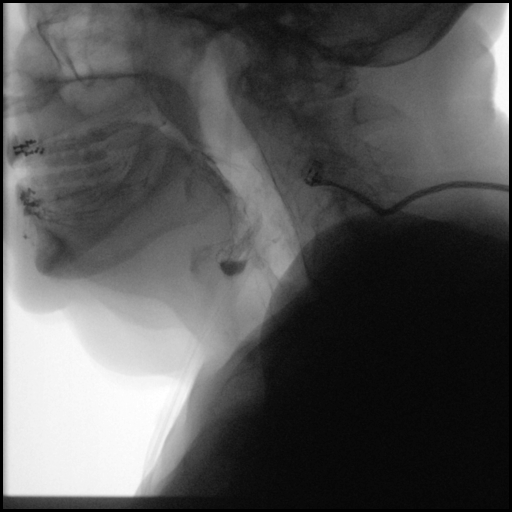

[Series 8: cp_standard · 0.34mm/px · 2 of 105 frames shown (8 of 10)]
[frame 53/105]
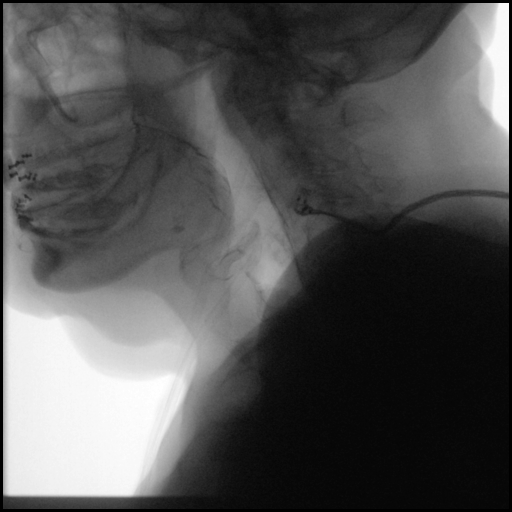
[frame 90/105]
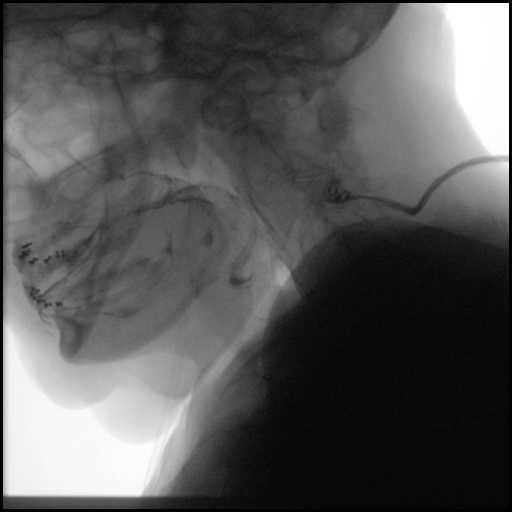

[Series 9: cp_standard · 0.34mm/px · 1 of 95 frames shown (9 of 10)]
[frame 2/95]
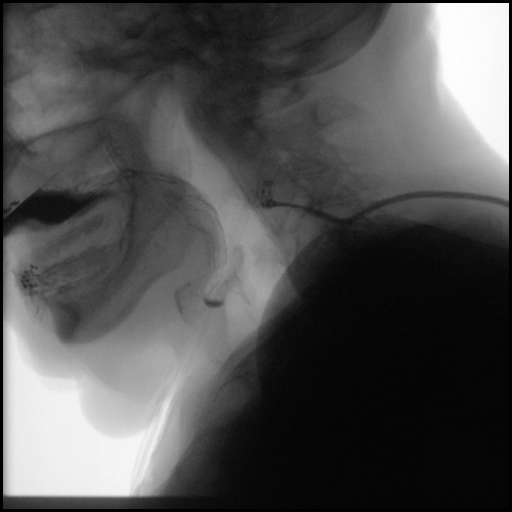

[Series 10: cp_standard · 0.34mm/px · 3 of 49 frames shown (10 of 10)]
[frame 8/49]
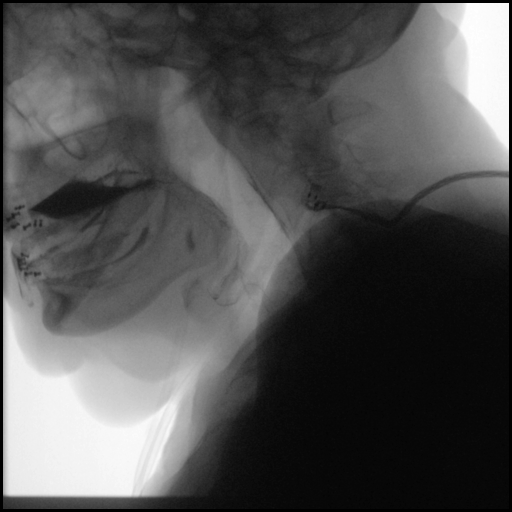
[frame 9/49]
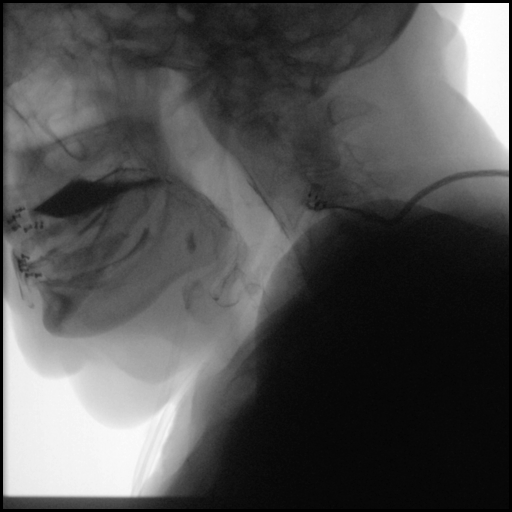
[frame 42/49]
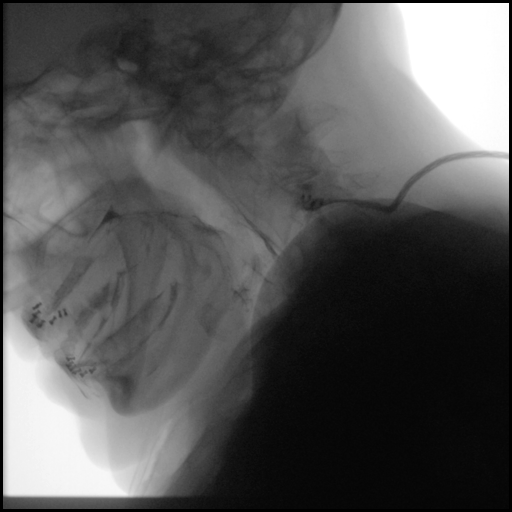

[20 of 24 positions shown; findings below may reference images not displayed]

FLUOROSCOPY FOR SWALLOWING FUNCTION STUDY:
Fluoroscopy was provided for swallowing function study, which was administered by a speech pathologist.  Final results and recommendations from this study are contained within the speech pathology report.
